# Patient Record
Sex: Male | Born: 1940 | Race: White | Hispanic: No | Marital: Married | Smoking: Current every day smoker
Health system: Southern US, Community
[De-identification: ages and names within clinical notes are randomized; demographics above are authoritative.]

## PROBLEM LIST (undated history)

## (undated) DIAGNOSIS — I82890 Acute embolism and thrombosis of other specified veins: Secondary | ICD-10-CM

## (undated) DIAGNOSIS — E119 Type 2 diabetes mellitus without complications: Secondary | ICD-10-CM

## (undated) DIAGNOSIS — I219 Acute myocardial infarction, unspecified: Secondary | ICD-10-CM

## (undated) DIAGNOSIS — J449 Chronic obstructive pulmonary disease, unspecified: Secondary | ICD-10-CM

## (undated) DIAGNOSIS — J45909 Unspecified asthma, uncomplicated: Secondary | ICD-10-CM

## (undated) DIAGNOSIS — C3490 Malignant neoplasm of unspecified part of unspecified bronchus or lung: Secondary | ICD-10-CM

## (undated) DIAGNOSIS — I10 Essential (primary) hypertension: Secondary | ICD-10-CM

## (undated) DIAGNOSIS — C9210 Chronic myeloid leukemia, BCR/ABL-positive, not having achieved remission: Secondary | ICD-10-CM

## (undated) DIAGNOSIS — C3410 Malignant neoplasm of upper lobe, unspecified bronchus or lung: Secondary | ICD-10-CM

## (undated) HISTORY — DX: Malignant neoplasm of unspecified part of unspecified bronchus or lung: C34.90

## (undated) HISTORY — DX: Chronic myeloid leukemia, BCR/ABL-positive, not having achieved remission: C92.10

## (undated) HISTORY — PX: THROAT SURGERY: SHX803

## (undated) HISTORY — DX: Acute embolism and thrombosis of other specified veins: I82.890

## (undated) HISTORY — DX: Type 2 diabetes mellitus without complications: E11.9

## (undated) HISTORY — PX: TUMOR REMOVAL: SHX12

## (undated) HISTORY — DX: Malignant neoplasm of upper lobe, unspecified bronchus or lung: C34.10

---

## 2005-03-12 ENCOUNTER — Ambulatory Visit: Payer: Self-pay

## 2006-11-17 ENCOUNTER — Emergency Department: Payer: Self-pay | Admitting: General Practice

## 2006-11-28 ENCOUNTER — Ambulatory Visit: Payer: Self-pay | Admitting: Internal Medicine

## 2007-10-09 DIAGNOSIS — C3410 Malignant neoplasm of upper lobe, unspecified bronchus or lung: Secondary | ICD-10-CM

## 2007-10-09 HISTORY — DX: Malignant neoplasm of upper lobe, unspecified bronchus or lung: C34.10

## 2008-03-04 ENCOUNTER — Ambulatory Visit: Payer: Self-pay | Admitting: Internal Medicine

## 2008-03-08 ENCOUNTER — Ambulatory Visit: Payer: Self-pay | Admitting: Internal Medicine

## 2008-03-10 ENCOUNTER — Ambulatory Visit: Payer: Self-pay | Admitting: Internal Medicine

## 2008-03-17 ENCOUNTER — Ambulatory Visit: Payer: Self-pay | Admitting: Internal Medicine

## 2008-03-23 ENCOUNTER — Ambulatory Visit: Payer: Self-pay | Admitting: Internal Medicine

## 2008-04-07 ENCOUNTER — Ambulatory Visit: Payer: Self-pay | Admitting: Internal Medicine

## 2008-05-08 ENCOUNTER — Ambulatory Visit: Payer: Self-pay | Admitting: Internal Medicine

## 2008-06-08 ENCOUNTER — Ambulatory Visit: Payer: Self-pay | Admitting: Internal Medicine

## 2008-07-08 ENCOUNTER — Ambulatory Visit: Payer: Self-pay | Admitting: Internal Medicine

## 2008-08-08 ENCOUNTER — Ambulatory Visit: Payer: Self-pay | Admitting: Internal Medicine

## 2008-09-07 ENCOUNTER — Ambulatory Visit: Payer: Self-pay | Admitting: Internal Medicine

## 2008-10-08 ENCOUNTER — Ambulatory Visit: Payer: Self-pay | Admitting: Internal Medicine

## 2008-10-13 ENCOUNTER — Ambulatory Visit: Payer: Self-pay | Admitting: Internal Medicine

## 2008-11-08 ENCOUNTER — Ambulatory Visit: Payer: Self-pay | Admitting: Internal Medicine

## 2009-02-05 ENCOUNTER — Ambulatory Visit: Payer: Self-pay | Admitting: Oncology

## 2009-02-14 ENCOUNTER — Ambulatory Visit: Payer: Self-pay | Admitting: Internal Medicine

## 2009-03-08 ENCOUNTER — Ambulatory Visit: Payer: Self-pay | Admitting: Oncology

## 2009-03-08 ENCOUNTER — Ambulatory Visit: Payer: Self-pay | Admitting: Internal Medicine

## 2009-06-08 ENCOUNTER — Ambulatory Visit: Payer: Self-pay | Admitting: Internal Medicine

## 2009-07-04 ENCOUNTER — Ambulatory Visit: Payer: Self-pay | Admitting: Internal Medicine

## 2009-07-08 ENCOUNTER — Ambulatory Visit: Payer: Self-pay | Admitting: Internal Medicine

## 2009-08-15 ENCOUNTER — Ambulatory Visit: Payer: Self-pay | Admitting: Gastroenterology

## 2009-10-08 ENCOUNTER — Ambulatory Visit: Payer: Self-pay | Admitting: Internal Medicine

## 2009-10-10 ENCOUNTER — Ambulatory Visit: Payer: Self-pay | Admitting: Internal Medicine

## 2009-10-12 ENCOUNTER — Ambulatory Visit: Payer: Self-pay | Admitting: Internal Medicine

## 2009-11-08 ENCOUNTER — Ambulatory Visit: Payer: Self-pay | Admitting: Internal Medicine

## 2010-02-05 ENCOUNTER — Ambulatory Visit: Payer: Self-pay | Admitting: Internal Medicine

## 2010-02-16 ENCOUNTER — Ambulatory Visit: Payer: Self-pay | Admitting: Internal Medicine

## 2010-03-08 ENCOUNTER — Ambulatory Visit: Payer: Self-pay | Admitting: Internal Medicine

## 2010-03-17 ENCOUNTER — Ambulatory Visit: Payer: Self-pay | Admitting: Internal Medicine

## 2010-03-30 ENCOUNTER — Ambulatory Visit: Payer: Self-pay | Admitting: Vascular Surgery

## 2010-04-07 ENCOUNTER — Ambulatory Visit: Payer: Self-pay | Admitting: Internal Medicine

## 2010-06-22 ENCOUNTER — Ambulatory Visit: Payer: Self-pay | Admitting: Internal Medicine

## 2011-02-06 ENCOUNTER — Ambulatory Visit: Payer: Self-pay | Admitting: Internal Medicine

## 2011-03-06 ENCOUNTER — Inpatient Hospital Stay: Payer: Self-pay | Admitting: Internal Medicine

## 2011-03-09 ENCOUNTER — Ambulatory Visit: Payer: Self-pay | Admitting: Internal Medicine

## 2011-03-14 ENCOUNTER — Ambulatory Visit: Payer: Self-pay | Admitting: Internal Medicine

## 2011-04-08 ENCOUNTER — Ambulatory Visit: Payer: Self-pay | Admitting: Internal Medicine

## 2011-05-31 ENCOUNTER — Ambulatory Visit: Payer: Self-pay | Admitting: Pain Medicine

## 2011-06-07 ENCOUNTER — Ambulatory Visit: Payer: Self-pay | Admitting: Pain Medicine

## 2011-06-14 ENCOUNTER — Ambulatory Visit: Payer: Self-pay | Admitting: Pain Medicine

## 2011-07-09 ENCOUNTER — Inpatient Hospital Stay: Payer: Self-pay | Admitting: Internal Medicine

## 2011-07-19 ENCOUNTER — Ambulatory Visit: Payer: Self-pay | Admitting: Otolaryngology

## 2011-07-31 ENCOUNTER — Ambulatory Visit: Payer: Self-pay | Admitting: Otolaryngology

## 2012-04-21 DIAGNOSIS — J3800 Paralysis of vocal cords and larynx, unspecified: Secondary | ICD-10-CM | POA: Insufficient documentation

## 2012-04-21 DIAGNOSIS — I252 Old myocardial infarction: Secondary | ICD-10-CM | POA: Insufficient documentation

## 2012-04-21 DIAGNOSIS — M1990 Unspecified osteoarthritis, unspecified site: Secondary | ICD-10-CM | POA: Insufficient documentation

## 2012-04-21 DIAGNOSIS — R0602 Shortness of breath: Secondary | ICD-10-CM | POA: Insufficient documentation

## 2012-04-21 DIAGNOSIS — J441 Chronic obstructive pulmonary disease with (acute) exacerbation: Secondary | ICD-10-CM | POA: Insufficient documentation

## 2012-04-21 DIAGNOSIS — I10 Essential (primary) hypertension: Secondary | ICD-10-CM | POA: Insufficient documentation

## 2012-04-21 DIAGNOSIS — Z859 Personal history of malignant neoplasm, unspecified: Secondary | ICD-10-CM | POA: Insufficient documentation

## 2012-06-27 ENCOUNTER — Ambulatory Visit: Payer: Self-pay | Admitting: Specialist

## 2013-01-20 ENCOUNTER — Ambulatory Visit: Payer: Self-pay | Admitting: Specialist

## 2013-02-13 ENCOUNTER — Ambulatory Visit: Payer: Self-pay | Admitting: Internal Medicine

## 2013-02-17 ENCOUNTER — Ambulatory Visit: Payer: Self-pay | Admitting: Specialist

## 2013-02-24 ENCOUNTER — Ambulatory Visit: Payer: Self-pay | Admitting: Internal Medicine

## 2013-03-08 ENCOUNTER — Ambulatory Visit: Payer: Self-pay | Admitting: Internal Medicine

## 2013-08-07 ENCOUNTER — Ambulatory Visit: Payer: Self-pay | Admitting: Gastroenterology

## 2013-08-11 LAB — PATHOLOGY REPORT

## 2013-08-28 ENCOUNTER — Ambulatory Visit: Payer: Self-pay | Admitting: Internal Medicine

## 2013-08-28 LAB — CBC CANCER CENTER
Eosinophil #: 0.2 x10 3/mm (ref 0.0–0.7)
Eosinophil %: 3 %
HCT: 46.6 % (ref 40.0–52.0)
HGB: 15.4 g/dL (ref 13.0–18.0)
Lymphocyte %: 31.1 %
MCH: 28.6 pg (ref 26.0–34.0)
Neutrophil #: 4.4 x10 3/mm (ref 1.4–6.5)
Platelet: 195 x10 3/mm (ref 150–440)
RBC: 5.37 10*6/uL (ref 4.40–5.90)
RDW: 13.2 % (ref 11.5–14.5)
WBC: 7.7 x10 3/mm (ref 3.8–10.6)

## 2013-08-28 LAB — HEPATIC FUNCTION PANEL A (ARMC)
Albumin: 3.6 g/dL (ref 3.4–5.0)
Alkaline Phosphatase: 70 U/L (ref 50–136)
SGPT (ALT): 25 U/L (ref 12–78)

## 2013-08-28 LAB — CREATININE, SERUM
Creatinine: 1.14 mg/dL (ref 0.60–1.30)
EGFR (Non-African Amer.): >60

## 2013-08-28 LAB — CALCIUM: Calcium, Total: 9 mg/dL (ref 8.5–10.1)

## 2013-09-07 ENCOUNTER — Ambulatory Visit: Payer: Self-pay | Admitting: Internal Medicine

## 2014-02-26 ENCOUNTER — Ambulatory Visit: Payer: Self-pay | Admitting: Internal Medicine

## 2014-02-26 LAB — CBC CANCER CENTER
Basophil #: 0.1 x10 3/mm (ref 0.0–0.1)
Basophil %: 1.1 %
Eosinophil #: 0.2 x10 3/mm (ref 0.0–0.7)
Eosinophil %: 2.7 %
HCT: 44.3 % (ref 40.0–52.0)
HGB: 15.1 g/dL (ref 13.0–18.0)
LYMPHS ABS: 2.3 x10 3/mm (ref 1.0–3.6)
Lymphocyte %: 32.5 %
MCH: 29.1 pg (ref 26.0–34.0)
MCHC: 34 g/dL (ref 32.0–36.0)
MCV: 86 fL (ref 80–100)
MONO ABS: 0.6 x10 3/mm (ref 0.2–1.0)
Monocyte %: 8.2 %
NEUTROS PCT: 55.5 %
Neutrophil #: 3.9 x10 3/mm (ref 1.4–6.5)
PLATELETS: 164 x10 3/mm (ref 150–440)
RBC: 5.18 10*6/uL (ref 4.40–5.90)
RDW: 13.3 % (ref 11.5–14.5)
WBC: 6.9 x10 3/mm (ref 3.8–10.6)

## 2014-02-26 LAB — HEPATIC FUNCTION PANEL A (ARMC)
AST: 17 U/L (ref 15–37)
Albumin: 3.4 g/dL (ref 3.4–5.0)
Alkaline Phosphatase: 54 U/L
BILIRUBIN TOTAL: 0.4 mg/dL (ref 0.2–1.0)
Bilirubin, Direct: 0.1 mg/dL (ref 0.00–0.20)
SGPT (ALT): 25 U/L (ref 12–78)
Total Protein: 7.3 g/dL (ref 6.4–8.2)

## 2014-02-26 LAB — CREATININE, SERUM: CREATININE: 0.99 mg/dL (ref 0.60–1.30)

## 2014-02-26 LAB — CALCIUM: CALCIUM: 8.2 mg/dL — AB (ref 8.5–10.1)

## 2014-03-08 ENCOUNTER — Ambulatory Visit: Payer: Self-pay | Admitting: Internal Medicine

## 2014-05-06 ENCOUNTER — Encounter: Payer: Self-pay | Admitting: Podiatrist

## 2014-05-06 ENCOUNTER — Ambulatory Visit (INDEPENDENT_AMBULATORY_CARE_PROVIDER_SITE_OTHER): Payer: Medicare Other | Admitting: Podiatrist

## 2014-05-06 ENCOUNTER — Ambulatory Visit (INDEPENDENT_AMBULATORY_CARE_PROVIDER_SITE_OTHER): Payer: Medicare Other

## 2014-05-06 VITALS — BP 122/60 | HR 74 | Resp 16 | Ht 69.0 in | Wt 207.0 lb

## 2014-05-06 DIAGNOSIS — M722 Plantar fascial fibromatosis: Secondary | ICD-10-CM

## 2014-05-06 MED ORDER — MELOXICAM 15 MG PO TABS
15.0000 mg | ORAL_TABLET | Freq: Every day | ORAL | Status: DC
Start: 2014-05-06 — End: 2015-12-28

## 2014-05-06 MED ORDER — PREDNISONE 10 MG PO KIT: PACK | ORAL | Status: DC

## 2014-05-06 NOTE — Progress Notes (Signed)
   Subjective:    Patient ID: Richard Jimenez, male    DOB: 1941/02/10, 73 y.o.   MRN: [id number]  HPI Comments: i have left heel pain. Ive had it for 8 - 9 months. Its remained the same sometimes worse. Am is really bad. Walking and standing hurts. i seen dr Vickki Muff twice this month and he gave me 2 shots of cortisone and gave me a brace to sleep in. i have massaged my foot, exercises, and that's it  Foot Pain Associated symptoms include coughing, fatigue and numbness.   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   Review of Systems  Constitutional: Positive for fatigue.       Sweating  HENT: Positive for hearing loss and trouble swallowing.   Eyes: Positive for pain.  Respiratory: Positive for cough, shortness of breath and wheezing.   Genitourinary: Positive for frequency.  Musculoskeletal:       Joint pain  Skin: Positive for color change.  Neurological: Positive for numbness.  All other systems reviewed and are negative.      Objective:   Physical Exam  Patient is awake, alert, and oriented x 3.  In no acute distress.  Vascular status is intact with palpable pedal pulses at 2/4 DP and PT bilateral and capillary refill time within normal limits. Neurological sensation is also intact bilaterally via Semmes Weinstein monofilament at 5/5 sites. Light touch, vibratory sensation, Achilles tendon reflex is intact. Dermatological exam reveals skin color, turger and texture as normal. No open lesions present.  Musculature intact with dorsiflexion, plantarflexion, inversion, eversion.  Severe pain on palpation plantar medial and plantar central aspect of the left heel is noted. No trauma or injury reported. Mild pain with medial to lateral squeeze. Symptoms consistent with plantar fasciitis left resistant to conservative care.     Assessment & Plan:  Plantar fasciitis left foot  Plan: The patient has recently had 2  separate injections of steroid from Dr. Vickki Muff. He also has a brace and he states that nothing has helped his pain. I recommended a Sterapred Dosepak followed by [facility] anti-inflammatory and a plantar fascial bracing. I also offered him an offloading boot however would like to hold off on this at this visit. He will be seen back in 3 weeks for followup if no improvement an MRI may be warranted.

## 2014-05-06 NOTE — Patient Instructions (Signed)

## 2014-06-03 ENCOUNTER — Ambulatory Visit: Payer: Medicare Other | Admitting: Podiatry

## 2015-03-10 ENCOUNTER — Other Ambulatory Visit: Payer: Self-pay

## 2015-03-10 DIAGNOSIS — C3412 Malignant neoplasm of upper lobe, left bronchus or lung: Secondary | ICD-10-CM

## 2015-03-11 ENCOUNTER — Encounter (INDEPENDENT_AMBULATORY_CARE_PROVIDER_SITE_OTHER): Payer: Self-pay

## 2015-03-11 ENCOUNTER — Inpatient Hospital Stay: Payer: Medicare Other | Attending: Internal Medicine

## 2015-03-11 ENCOUNTER — Inpatient Hospital Stay (HOSPITAL_BASED_OUTPATIENT_CLINIC_OR_DEPARTMENT_OTHER): Payer: Medicare Other | Admitting: Internal Medicine

## 2015-03-11 VITALS — BP 148/80 | HR 66 | Temp 95.3°F | Resp 18 | Ht 69.0 in | Wt 209.9 lb

## 2015-03-11 DIAGNOSIS — R05 Cough: Secondary | ICD-10-CM

## 2015-03-11 DIAGNOSIS — C3412 Malignant neoplasm of upper lobe, left bronchus or lung: Secondary | ICD-10-CM

## 2015-03-11 DIAGNOSIS — Z85118 Personal history of other malignant neoplasm of bronchus and lung: Secondary | ICD-10-CM | POA: Insufficient documentation

## 2015-03-11 DIAGNOSIS — R0609 Other forms of dyspnea: Secondary | ICD-10-CM | POA: Insufficient documentation

## 2015-03-11 DIAGNOSIS — Z7982 Long term (current) use of aspirin: Secondary | ICD-10-CM

## 2015-03-11 DIAGNOSIS — Z9221 Personal history of antineoplastic chemotherapy: Secondary | ICD-10-CM | POA: Diagnosis not present

## 2015-03-11 DIAGNOSIS — Z800 Family history of malignant neoplasm of digestive organs: Secondary | ICD-10-CM

## 2015-03-11 DIAGNOSIS — Z923 Personal history of irradiation: Secondary | ICD-10-CM | POA: Insufficient documentation

## 2015-03-11 DIAGNOSIS — J449 Chronic obstructive pulmonary disease, unspecified: Secondary | ICD-10-CM | POA: Diagnosis not present

## 2015-03-11 DIAGNOSIS — F17210 Nicotine dependence, cigarettes, uncomplicated: Secondary | ICD-10-CM | POA: Diagnosis not present

## 2015-03-11 DIAGNOSIS — Z79899 Other long term (current) drug therapy: Secondary | ICD-10-CM | POA: Diagnosis not present

## 2015-03-11 LAB — HEPATIC FUNCTION PANEL
ALT: 20 U/L (ref 17–63)
AST: 22 U/L (ref 15–41)
Albumin: 3.7 g/dL (ref 3.5–5.0)
Alkaline Phosphatase: 45 U/L (ref 38–126)
BILIRUBIN TOTAL: 0.5 mg/dL (ref 0.3–1.2)
Bilirubin, Direct: 0.1 mg/dL (ref 0.1–0.5)
Indirect Bilirubin: 0.4 mg/dL (ref 0.3–0.9)
Total Protein: 6.9 g/dL (ref 6.5–8.1)

## 2015-03-11 LAB — CBC WITH DIFFERENTIAL/PLATELET
Basophils Absolute: 0 10*3/uL (ref 0–0.1)
Basophils Relative: 1 %
Eosinophils Absolute: 0.2 10*3/uL (ref 0–0.7)
Eosinophils Relative: 2 %
HCT: 41.1 % (ref 40.0–52.0)
Hemoglobin: 13.7 g/dL (ref 13.0–18.0)
LYMPHS PCT: 25 %
Lymphs Abs: 1.6 10*3/uL (ref 1.0–3.6)
MCH: 28.4 pg (ref 26.0–34.0)
MCHC: 33.4 g/dL (ref 32.0–36.0)
MCV: 85 fL (ref 80.0–100.0)
MONOS PCT: 7 %
Monocytes Absolute: 0.4 10*3/uL (ref 0.2–1.0)
Neutro Abs: 4.1 10*3/uL (ref 1.4–6.5)
Neutrophils Relative %: 65 %
PLATELETS: 183 10*3/uL (ref 150–440)
RBC: 4.84 MIL/uL (ref 4.40–5.90)
RDW: 13.3 % (ref 11.5–14.5)
WBC: 6.3 10*3/uL (ref 3.8–10.6)

## 2015-03-11 LAB — CALCIUM: Calcium: 8.4 mg/dL — ABNORMAL LOW (ref 8.9–10.3)

## 2015-03-11 LAB — CREATININE, SERUM: Creatinine, Ser: 1.03 mg/dL (ref 0.61–1.24)

## 2015-03-29 ENCOUNTER — Encounter: Payer: Self-pay | Admitting: Internal Medicine

## 2015-03-29 DIAGNOSIS — C3410 Malignant neoplasm of upper lobe, unspecified bronchus or lung: Secondary | ICD-10-CM | POA: Insufficient documentation

## 2015-03-29 NOTE — Progress Notes (Signed)
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  Completed 4 cycles chemotherapy and concurrent radiation September 2009  CT scan of the chest May 2012 - 9x4 mm RUL lung nodule PET scan on 03/09/11 negative for any lung abnormality.     HISTORY OF PRESENT ILLNESS:  Patient returns for continued oncology followup, he was seen about one year ago. Last CT scan of the chest on April 15 which continued to show left upper lung abnormality as before, he then had PET scan done on Feb 17, 2013 which was reported unremarkable for recurrent malignancy.  States that his chronic cough and dyspnea on exertion from COPD is unchanged.  Denies hemoptysis or chest pain.  Eating steady, no unintentional weight loss.  No new headaches or imbalance.  Remains physically active as much as possible.. No pain issues, 0/10.   REVIEW OF SYSTEMS:   ROS As in HPI above. In addition, no fever, chills or sweats. No new headaches or focal weakness.  No new sore throat or dysphagia. No hemoptysis or chest pain. No dizziness or palpitation. No abdominal pain, constipation, diarrhea, dysuria or hematuria. No new skin rash or bleeding symptoms. No new paresthesias in extremities. PS ECOG 1.  PAST MEDICAL HISTORY: Reviewed Past Medical History  Diagnosis Date  . 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 T2/T3, N0, M0 (stage IIB) squamous cell carcinoma of left upper lobe   lung - refused surgery. Completed 4 cycles chemotherapy and concurrent radiation September 2009  Colonoscopy in November 2014 - tubular adenomas x2  PAST SURGICAL  HISTORY:Reviewed As above  FAMILY HISTORY:Reviewed Noncontributory. Remarkable for colon cancer.   ADVANCED DIRECTIVES:  No - patient declined information  SOCIAL HISTORY: Reviewed. Chronic smoker, 50 pack year history, ongoing. Occasional alcohol intake. No history of recreational drug usage.  Allergies  Allergen Reactions  . Clarithromycin Nausea And Vomiting  . Penicillins Nausea Only    Current Outpatient Prescriptions  Medication Sig Dispense Refill  . ALBUTEROL IN Inhale into the lungs as needed.    . Fluticasone-Salmeterol (ADVAIR DISKUS) 250-50 MCG/DOSE AEPB Inhale into the lungs.    . meloxicam (MOBIC) 15 MG tablet Take 1 tablet (15 mg total) by mouth daily. (Patient not taking: Reported on 03/11/2015) 30 tablet 2  . PredniSONE 10 MG KIT 6 day tapering dose (Patient not taking: Reported on 03/11/2015) 1 kit 0   No current facility-administered medications for this visit.    OBJECTIVE: Filed Vitals:   03/11/15 1124  BP: 148/80  Pulse: 66  Temp: 95.3 F (35.2 C)  Resp: 18     Body mass index is 30.98 kg/(m^2).    ECOG FS:1 - Symptomatic but completely ambulatory  GENERAL: Patient is alert and oriented and in no acute distress. There is no icterus. HEENT: EOMs intact. Oral exam negative for thrush or lesions. No cervical lymphadenopathy. CVS: S1S2, regular LUNGS: Bilaterally clear to auscultation, no rhonchi. ABDOMEN: Soft, nontender. No hepatosplenomegaly clinically.  EXTREMITIES: No pedal edema.   LAB RESULTS:    Component Value Date/Time   CREATININE 1.03 03/11/2015 1020   CREATININE  0.99 02/26/2014 1033   CALCIUM 8.4* 03/11/2015 1020   CALCIUM 8.2* 02/26/2014 1033   PROT 6.9 03/11/2015 1020   PROT 7.3 02/26/2014 1033   ALBUMIN 3.7 03/11/2015 1020   ALBUMIN 3.4 02/26/2014 1033   AST 22 03/11/2015 1020   AST 17 02/26/2014 1033   ALT 20 03/11/2015 1020   ALT 25 02/26/2014 1033   ALKPHOS 45 03/11/2015 1020   ALKPHOS 54 02/26/2014 1033   BILITOT 0.5  03/11/2015 1020   GFRNONAA >60 03/11/2015 1020   GFRNONAA >60 02/26/2014 1033   GFRAA >60 03/11/2015 1020   GFRAA >60 02/26/2014 1033    No results found for: SPEP, UPEP  Lab Results  Component Value Date   WBC 6.3 03/11/2015   NEUTROABS 4.1 03/11/2015   HGB 13.7 03/11/2015   HCT 41.1 03/11/2015   MCV 85.0 03/11/2015   PLT 183 03/11/2015     STUDIES: April 2014 - CT scan of the chest. IMPRESSION:  Persistent soft tissue nodular density in the left upper lobe extending to the left lung apex with bony erosion of the first rib. There is some bony sclerosis present in this area as well. PET CT would  be the most accurate continued followup evaluate for persistent or recurrent malignant disease. There is stable nodularity in the right upper lobe.  May 2014 - PET scan. IMPRESSION:  1. I do not see abnormal uptake of the radiopharmaceutical within the neck or chest or abdomen or pelvis to suggest recurrent malignancy.   2. Low level increased uptake associated with both vocal cords are most compatible with phonation.   3. There is stable parenchymal consolidation in the superior aspect of the left upper lobe. There is no mediastinal or hilar lymphadenopathy or abnormal uptake.   4. No abnormal uptake within the chest wall is demonstrated. No lytic or blastic chest wall lesion it is demonstrated.   STAGING: Lung cancer, upper lobe   Staging form: Lung, AJCC 7th Edition     Clinical stage from 03/29/2015: Stage IIB (T3, N0, M0) - Signed by Leia Alf, MD on 03/29/2015.   ASSESSMENT / PLAN:   1. History of Stage IIB squamous lung cancer s/p 4 cycles chemotherapy and concurrent radiation completed September 2009 -  Reviewed labs and d/w patient. He does not have any clinical evidence to suggest recurrent or metastatic lung cancer. He has symptoms of chronic COPD and follows with Dr. Raul Del.  Otherwise no hemoptysis, chest pain, unintentional weight loss or other symptoms to suggest  recurrent or metastatic lung cancer.  CT scan of the chest done in April 2014 showed persistent abnormalities, he then had PET scan done in May 2014 which did not show any progressive or new abnormality to suggest recurrent/progressive lung cancer.  Patient is now > 6 years out of treatment for lung cancer. Plan is to continue surveillance, will see him back in 12 months with labs including CBC, creatinine, calcium, LFT. Will consider radiological evaluation if he develops new symptoms or signs to suggest recurrent malignancy. 2. Smoking Cessation - have again strongly advised to completely quit smoking, he does not want to take any patches or try medication. States that he look into other options and try to quit on his own. 3. In between visits, the patient has been advised to call or come to the ER in case of new symptoms, unintentional weight loss or acute sickness and will be evaluated sooner. He is agreeable to this plan.   Leia Alf, MD  03/29/2015 8:20 PM

## 2015-07-08 DIAGNOSIS — E119 Type 2 diabetes mellitus without complications: Secondary | ICD-10-CM | POA: Insufficient documentation

## 2015-12-28 ENCOUNTER — Encounter: Payer: Self-pay | Admitting: Medical Oncology

## 2015-12-28 ENCOUNTER — Emergency Department
Admission: EM | Admit: 2015-12-28 | Discharge: 2015-12-28 | Disposition: A | Discharge status: Home / Self Care | Payer: Medicare Other | Attending: Emergency Medicine | Admitting: Emergency Medicine

## 2015-12-28 ENCOUNTER — Emergency Department: Payer: Medicare Other

## 2015-12-28 DIAGNOSIS — F17200 Nicotine dependence, unspecified, uncomplicated: Secondary | ICD-10-CM | POA: Diagnosis not present

## 2015-12-28 DIAGNOSIS — R0789 Other chest pain: Secondary | ICD-10-CM | POA: Insufficient documentation

## 2015-12-28 DIAGNOSIS — E119 Type 2 diabetes mellitus without complications: Secondary | ICD-10-CM | POA: Insufficient documentation

## 2015-12-28 DIAGNOSIS — Z880 Allergy status to penicillin: Secondary | ICD-10-CM | POA: Diagnosis not present

## 2015-12-28 DIAGNOSIS — R079 Chest pain, unspecified: Secondary | ICD-10-CM | POA: Diagnosis present

## 2015-12-28 DIAGNOSIS — Z7982 Long term (current) use of aspirin: Secondary | ICD-10-CM | POA: Insufficient documentation

## 2015-12-28 DIAGNOSIS — Z79899 Other long term (current) drug therapy: Secondary | ICD-10-CM | POA: Insufficient documentation

## 2015-12-28 DIAGNOSIS — Z7984 Long term (current) use of oral hypoglycemic drugs: Secondary | ICD-10-CM | POA: Diagnosis not present

## 2015-12-28 DIAGNOSIS — I10 Essential (primary) hypertension: Secondary | ICD-10-CM | POA: Insufficient documentation

## 2015-12-28 HISTORY — DX: Chronic obstructive pulmonary disease, unspecified: J44.9

## 2015-12-28 HISTORY — DX: Essential (primary) hypertension: I10

## 2015-12-28 HISTORY — DX: Type 2 diabetes mellitus without complications: E11.9

## 2015-12-28 HISTORY — DX: Acute myocardial infarction, unspecified: I21.9

## 2015-12-28 LAB — BASIC METABOLIC PANEL
ANION GAP: 6 (ref 5–15)
BUN: 13 mg/dL (ref 6–20)
CHLORIDE: 103 mmol/L (ref 101–111)
CO2: 28 mmol/L (ref 22–32)
Calcium: 9.2 mg/dL (ref 8.9–10.3)
Creatinine, Ser: 0.88 mg/dL (ref 0.61–1.24)
GFR calc non Af Amer: >60 mL/min (ref >60)
Glucose, Bld: 141 mg/dL — ABNORMAL HIGH (ref 65–99)
POTASSIUM: 4 mmol/L (ref 3.5–5.1)
Sodium: 137 mmol/L (ref 135–145)

## 2015-12-28 LAB — CBC
HEMATOCRIT: 44.4 % (ref 40.0–52.0)
HEMOGLOBIN: 14.7 g/dL (ref 13.0–18.0)
MCH: 28 pg (ref 26.0–34.0)
MCHC: 33.1 g/dL (ref 32.0–36.0)
MCV: 84.4 fL (ref 80.0–100.0)
Platelets: 198 10*3/uL (ref 150–440)
RBC: 5.26 MIL/uL (ref 4.40–5.90)
RDW: 13.8 % (ref 11.5–14.5)
WBC: 7.5 10*3/uL (ref 3.8–10.6)

## 2015-12-28 LAB — TROPONIN I
Troponin I: <0.03 ng/mL (ref <0.031)
Troponin I: <0.03 ng/mL (ref <0.031)

## 2015-12-28 MED ORDER — ASPIRIN EC 325 MG PO TBEC: 325.0000 mg | DELAYED_RELEASE_TABLET | Freq: Every day | ORAL | Status: DC

## 2015-12-28 MED ORDER — FAMOTIDINE 20 MG PO TABS
40.0000 mg | ORAL_TABLET | Freq: Once | ORAL | Status: AC
  Administered 2015-12-28: 40 mg via ORAL
  Filled 2015-12-28: qty 2

## 2015-12-28 MED ORDER — SUCRALFATE 1 G PO TABS: 1.0000 g | ORAL_TABLET | Freq: Four times a day (QID) | ORAL | Status: DC

## 2015-12-28 MED ORDER — GI COCKTAIL ~~LOC~~
30.0000 mL | ORAL | Status: AC
  Administered 2015-12-28: 30 mL via ORAL
  Filled 2015-12-28: qty 30

## 2015-12-28 MED ORDER — ASPIRIN 81 MG PO CHEW
324.0000 mg | CHEWABLE_TABLET | Freq: Once | ORAL | Status: AC
  Administered 2015-12-28: 324 mg via ORAL
  Filled 2015-12-28: qty 4

## 2015-12-28 MED ORDER — RANITIDINE HCL 150 MG PO CAPS: 150.0000 mg | ORAL_CAPSULE | Freq: Two times a day (BID) | ORAL | Status: DC

## 2015-12-28 NOTE — ED Provider Notes (Signed)
[facility] Emergency Department Provider Note  ____________________________________________  Time seen: 11:40 AM  I have reviewed the triage vital signs and the nursing notes.   HISTORY  Chief Complaint Chest Pain    HPI Richard Jimenez is a 75 y.o. male who complains of chest pain for the past 3 days. It is intermittent lasting less than 1 minute at a time, described as aching, nonradiating, no shortness of breath diaphoresis or vomiting. Not exertional, not pleuritic. No aggravating or alleviating factors. Moderate to severe in intensity when it comes on. 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 This does not feel like any other pains that he has had before related to his COPD or lung cancer. Reports that his only medication is metformin for diabetes. He has borderline hypertension and borderline hyperlipidemia according to the patient. Does not have a cardiologist. Sees Dr. Raul Del for pulmonology and Dr. Gilford Rile for primary care.     Past Medical History  Diagnosis Date  . Lung cancer, upper lobe ([place]) 03/29/2015  . Myocardial infarct ([place])   . COPD (chronic obstructive pulmonary disease) ([place])   . Hypertension   . Diabetes mellitus without complication [facility])      Patient Active Problem List   Diagnosis Date Noted  . Lung cancer, upper lobe ([place]) 03/29/2015     Past Surgical History  Procedure Laterality Date  . Throat surgery       Current Outpatient Rx  Name  Route  Sig  Dispense  Refill  . albuterol (PROAIR HFA) 108 (90 Base) MCG/ACT inhaler   Inhalation   Inhale 2 puffs into the lungs every 6 (six) hours as needed.         Marland Kitchen albuterol (PROVENTIL) (2.5 MG/3ML) 0.083% nebulizer solution   Inhalation   Inhale 3 mLs into the lungs every 12 (twelve) hours as needed.         . metFORMIN (GLUCOPHAGE) 1000 MG tablet   Oral   Take 1,000 mg by mouth 2 (two) times  daily.         Marland Kitchen aspirin EC 325 MG tablet   Oral   Take 1 tablet (325 mg total) by mouth daily.   30 tablet   0   . ranitidine (ZANTAC) 150 MG capsule   Oral   Take 1 capsule (150 mg total) by mouth 2 (two) times daily.   28 capsule   0   . sucralfate (CARAFATE) 1 g tablet   Oral   Take 1 tablet (1 g total) by mouth 4 (four) times daily.   120 tablet   1      Allergies Clarithromycin and Penicillins   No family history on file.  Social History Social History  Substance Use Topics  . Smoking status: Current Every Day Smoker  . Smokeless tobacco: None  . Alcohol Use: Yes     Comment: rarely    Review of Systems  Constitutional:   No fever or chills. No weight changes Eyes:   No blurry vision or double vision.  ENT:   No sore throat.  Cardiovascular:   Positive as above chest pain. Respiratory:   No dyspnea or cough. Gastrointestinal:   Negative for abdominal pain, vomiting and diarrhea.  No BRBPR or melena. Genitourinary:   Negative for dysuria or difficulty urinating. Musculoskeletal:   Negative for back pain. No joint swelling or pain. Skin:  Negative for rash. Neurological:   Negative for headaches, focal weakness or numbness. Psychiatric:  No anxiety or depression.   Endocrine:  No changes in energy or sleep difficulty.  10-point ROS otherwise negative.  ____________________________________________   PHYSICAL EXAM:  VITAL SIGNS: ED Triage Vitals  Enc Vitals Group     BP 12/28/15 1120 149/74 mmHg     Pulse Rate 12/28/15 1120 80     Resp 12/28/15 1120 20     Temp --      Temp src --      SpO2 12/28/15 1120 99 %     Weight 12/28/15 1120 209 lb (94.802 kg)     Height 12/28/15 1120 '5\' 9"'$  (1.753 m)     Head Cir --      Peak Flow --      Pain Score 12/28/15 1120 4     Pain Loc --      Pain Edu? --      Excl. in [place]? --     Vital signs reviewed, nursing assessments reviewed.   Constitutional:   Alert and oriented. Well appearing and in  no distress. Eyes:   No scleral icterus. No conjunctival pallor. PERRL. EOMI ENT   Head:   Normocephalic and atraumatic.   Nose:   No congestion/rhinnorhea. No septal hematoma   Mouth/Throat:   MMM, no pharyngeal erythema. No peritonsillar mass.    Neck:   No stridor. No SubQ emphysema. No meningismus. Hematological/Lymphatic/Immunilogical:   No cervical lymphadenopathy. Cardiovascular:   RRR. Symmetric bilateral radial and DP pulses.  No murmurs.  Respiratory:   Normal respiratory effort without tachypnea nor retractions. Breath sounds are clear and equal bilaterally. No wheezes/rales/rhonchi. Gastrointestinal:   Soft and nontender. Non distended. There is no CVA tenderness.  No rebound, rigidity, or guarding. Genitourinary:   deferred Musculoskeletal:   Nontender with normal range of motion in all extremities. No joint effusions.  No lower extremity tenderness.  No edema. Chest wall nontender Neurologic:   Normal speech and language.  CN 2-10 normal. Motor grossly intact. No gross focal neurologic deficits are appreciated.  Skin:    Skin is warm, dry and intact. No rash noted.  No petechiae, purpura, or bullae. Psychiatric:   Mood and affect are normal. ____________________________________________    LABS (pertinent positives/negatives) (all labs ordered are listed, but only abnormal results are displayed) Labs Reviewed  BASIC METABOLIC PANEL - Abnormal; Notable for the following:    Glucose, Bld 141 (*)    All other components within normal limits  CBC  TROPONIN I  TROPONIN I   ____________________________________________   EKG  Interpreted by me Normal sinus rhythm rate of 80, normal axis, normal intervals. Left bundle branch block. Normal ST segments. T wave inversions in the lateral leads due to repolarization abnormality with a bundle-branch block, no acute ischemic changes.  ____________________________________________    RADIOLOGY  Chest x-ray  unremarkable  ____________________________________________   PROCEDURES   ____________________________________________   INITIAL IMPRESSION / ASSESSMENT AND PLAN / ED COURSE  Pertinent labs & imaging results that were available during my care of the patient were reviewed by me and considered in my medical decision making (see chart for details).  Patient presented with atypical chest pain.Considering the patient's symptoms, medical history, and physical examination today, I have low suspicion for ACS, PE, TAD, pneumothorax, carditis, mediastinitis, pneumonia, CHF, or sepsis.  Given the vagueness of his current symptoms, we'll give aspirin as well as a GI cocktail as a trial.  I think that his pain is very unlikely to be related to ACS, but due to his risk factors are to check troponin 2. This was all negative. His symptoms resolved after receiving a GI cocktail in the ED. On reassessment at 3:00 PM the patient is completely symptomatic and feels much better and wants to go home. I see no benefit to hospitalization at this time. I encouraged him to follow up closely with cardiology for further evaluation. Continue antiacids as well as aspirin. Given his response to therapy and the nature of the symptoms I think this is highly likely to be GERD.     ____________________________________________   FINAL CLINICAL IMPRESSION(S) / ED DIAGNOSES  Final diagnoses:  Atypical chest pain      Carrie Mew, MD 12/28/15 [phone]

## 2015-12-28 NOTE — Discharge Instructions (Signed)
Nonspecific Chest Pain  °Chest pain can be caused by many different conditions. There is always a chance that your pain could be related to something serious, such as a heart attack or a blood clot in your lungs. Chest pain can also be caused by conditions that are not life-threatening. If you have chest pain, it is very important to follow up with your health care provider. °CAUSES  °Chest pain can be caused by: °· Heartburn. °· Pneumonia or bronchitis. °· Anxiety or stress. °· Inflammation around your heart (pericarditis) or lung (pleuritis or pleurisy). °· A blood clot in your lung. °· A collapsed lung (pneumothorax). It can develop suddenly on its own (spontaneous pneumothorax) or from trauma to the chest. °· Shingles infection (varicella-zoster virus). °· Heart attack. °· Damage to the bones, muscles, and cartilage that make up your chest wall. This can include: °¨ Bruised bones due to injury. °¨ Strained muscles or cartilage due to frequent or repeated coughing or overwork. °¨ Fracture to one or more ribs. °¨ Sore cartilage due to inflammation (costochondritis). °RISK FACTORS  °Risk factors for chest pain may include: °· Activities that increase your risk for trauma or injury to your chest. °· Respiratory infections or conditions that cause frequent coughing. °· Medical conditions or overeating that can cause heartburn. °· Heart disease or family history of heart disease. °· Conditions or health behaviors that increase your risk of developing a blood clot. °· Having had chicken pox (varicella zoster). °SIGNS AND SYMPTOMS °Chest pain can feel like: °· Burning or tingling on the surface of your chest or deep in your chest. °· Crushing, pressure, aching, or squeezing pain. °· Dull or sharp pain that is worse when you move, cough, or take a deep breath. °· Pain that is also felt in your back, neck, shoulder, or arm, or pain that spreads to any of these areas. °Your chest pain may come and go, or it may stay  constant. °DIAGNOSIS °Lab tests or other studies may be needed to find the cause of your pain. Your health care provider may have you take a test called an ambulatory ECG (electrocardiogram). An ECG records your heartbeat patterns at the time the test is performed. You may also have other tests, such as: °· Transthoracic echocardiogram (TTE). During echocardiography, sound waves are used to create a picture of all of the heart structures and to look at how blood flows through your heart. °· Transesophageal echocardiogram (TEE). This is a more advanced imaging test that obtains images from inside your body. It allows your health care provider to see your heart in finer detail. °· Cardiac monitoring. This allows your health care provider to monitor your heart rate and rhythm in real time. °· Holter monitor. This is a portable device that records your heartbeat and can help to diagnose abnormal heartbeats. It allows your health care provider to track your heart activity for several days, if needed. °· Stress tests. These can be done through exercise or by taking medicine that makes your heart beat more quickly. °· Blood tests. °· Imaging tests. °TREATMENT  °Your treatment depends on what is causing your chest pain. Treatment may include: °· Medicines. These may include: °¨ Acid blockers for heartburn. °¨ Anti-inflammatory medicine. °¨ Pain medicine for inflammatory conditions. °¨ Antibiotic medicine, if an infection is present. °¨ Medicines to dissolve blood clots. °¨ Medicines to treat coronary artery disease. °· Supportive care for conditions that do not require medicines. This may include: °¨ Resting. °¨ Applying heat   or cold packs to injured areas. °¨ Limiting activities until pain decreases. °HOME CARE INSTRUCTIONS °· If you were prescribed an antibiotic medicine, finish it all even if you start to feel better. °· Avoid any activities that bring on chest pain. °· Do not use any tobacco products, including  cigarettes, chewing tobacco, or electronic cigarettes. If you need help quitting, ask your health care provider. °· Do not drink alcohol. °· Take medicines only as directed by your health care provider. °· Keep all follow-up visits as directed by your health care provider. This is important. This includes any further testing if your chest pain does not go away. °· If heartburn is the cause for your chest pain, you may be told to keep your head raised (elevated) while sleeping. This reduces the chance that acid will go from your stomach into your esophagus. °· Make lifestyle changes as directed by your health care provider. These may include: °¨ Getting regular exercise. Ask your health care provider to suggest some activities that are safe for you. °¨ Eating a heart-healthy diet. A registered dietitian can help you to learn healthy eating options. °¨ Maintaining a healthy weight. °¨ Managing diabetes, if necessary. °¨ Reducing stress. °SEEK MEDICAL CARE IF: °· Your chest pain does not go away after treatment. °· You have a rash with blisters on your chest. °· You have a fever. °SEEK IMMEDIATE MEDICAL CARE IF:  °· Your chest pain is worse. °· You have an increasing cough, or you cough up blood. °· You have severe abdominal pain. °· You have severe weakness. °· You faint. °· You have chills. °· You have sudden, unexplained chest discomfort. °· You have sudden, unexplained discomfort in your arms, back, neck, or jaw. °· You have shortness of breath at any time. °· You suddenly start to sweat, or your skin gets clammy. °· You feel nauseous or you vomit. °· You suddenly feel light-headed or dizzy. °· Your heart begins to beat quickly, or it feels like it is skipping beats. °These symptoms may represent a serious problem that is an emergency. Do not wait to see if the symptoms will go away. Get medical help right away. Call your local emergency services (911 in the [place] Do not drive yourself to the hospital. °  °This  information is not intended to replace advice given to you by your health care provider. Make sure you discuss any questions you have with your health care provider. °  °Document Released: 07/04/2005 Document Revised: 10/15/2014 Document Reviewed: 04/30/2014 °Elsevier Interactive Patient Education ©2016 [employer] ° °

## 2015-12-28 NOTE — ED Notes (Signed)
Pt reports left sided chest pain x 3 days with reports that pain feels similar to time he had MI in past. Pt reports sob with pain.

## 2015-12-29 ENCOUNTER — Telehealth: Payer: Self-pay | Admitting: Cardiology

## 2015-12-29 NOTE — Telephone Encounter (Signed)
Spoke with patient, Pt was seen in ED for Chest Pain on 12/28/15 Pt is coming 01/12/16 to see Dr Yvone Neu

## 2015-12-29 NOTE — Telephone Encounter (Signed)
Spoke with patient regarding his recent visit to the ED for chest pain. He stated that he was feeling better and would be in for his appointment. Verified his appointment date and time 01/12/16 at 1:30PM. He had no further questions at this time.

## 2016-01-03 ENCOUNTER — Other Ambulatory Visit: Payer: Self-pay | Admitting: Internal Medicine

## 2016-01-03 DIAGNOSIS — R071 Chest pain on breathing: Secondary | ICD-10-CM

## 2016-01-09 ENCOUNTER — Ambulatory Visit
Admission: RE | Admit: 2016-01-09 | Discharge: 2016-01-09 | Disposition: A | Discharge status: Home / Self Care | Payer: Medicare Other | Source: Ambulatory Visit | Attending: Internal Medicine | Admitting: Internal Medicine

## 2016-01-09 DIAGNOSIS — I700 Atherosclerosis of aorta: Secondary | ICD-10-CM | POA: Insufficient documentation

## 2016-01-09 DIAGNOSIS — R071 Chest pain on breathing: Secondary | ICD-10-CM | POA: Diagnosis not present

## 2016-01-09 DIAGNOSIS — C770 Secondary and unspecified malignant neoplasm of lymph nodes of head, face and neck: Secondary | ICD-10-CM | POA: Diagnosis not present

## 2016-01-09 DIAGNOSIS — I2510 Atherosclerotic heart disease of native coronary artery without angina pectoris: Secondary | ICD-10-CM | POA: Insufficient documentation

## 2016-01-09 DIAGNOSIS — C7800 Secondary malignant neoplasm of unspecified lung: Secondary | ICD-10-CM | POA: Diagnosis not present

## 2016-01-09 DIAGNOSIS — C771 Secondary and unspecified malignant neoplasm of intrathoracic lymph nodes: Secondary | ICD-10-CM | POA: Insufficient documentation

## 2016-01-09 DIAGNOSIS — R911 Solitary pulmonary nodule: Secondary | ICD-10-CM | POA: Insufficient documentation

## 2016-01-09 HISTORY — DX: Unspecified asthma, uncomplicated: J45.909

## 2016-01-09 MED ORDER — IOPAMIDOL (ISOVUE-300) INJECTION 61%
75.0000 mL | Freq: Once | INTRAVENOUS | Status: AC | PRN
  Administered 2016-01-09: 75 mL via INTRAVENOUS

## 2016-01-12 ENCOUNTER — Ambulatory Visit: Payer: Medicare Other | Admitting: Cardiology

## 2016-01-13 ENCOUNTER — Inpatient Hospital Stay (HOSPITAL_BASED_OUTPATIENT_CLINIC_OR_DEPARTMENT_OTHER): Payer: Medicare Other | Admitting: Internal Medicine

## 2016-01-13 ENCOUNTER — Encounter: Payer: Self-pay | Admitting: Internal Medicine

## 2016-01-13 ENCOUNTER — Inpatient Hospital Stay: Payer: Medicare Other | Attending: Internal Medicine

## 2016-01-13 VITALS — BP 135/67 | HR 73 | Temp 98.1°F | Ht 69.0 in | Wt 201.7 lb

## 2016-01-13 DIAGNOSIS — E119 Type 2 diabetes mellitus without complications: Secondary | ICD-10-CM | POA: Insufficient documentation

## 2016-01-13 DIAGNOSIS — Z7984 Long term (current) use of oral hypoglycemic drugs: Secondary | ICD-10-CM

## 2016-01-13 DIAGNOSIS — C3492 Malignant neoplasm of unspecified part of left bronchus or lung: Secondary | ICD-10-CM | POA: Insufficient documentation

## 2016-01-13 DIAGNOSIS — Z800 Family history of malignant neoplasm of digestive organs: Secondary | ICD-10-CM | POA: Diagnosis not present

## 2016-01-13 DIAGNOSIS — F17210 Nicotine dependence, cigarettes, uncomplicated: Secondary | ICD-10-CM

## 2016-01-13 DIAGNOSIS — I252 Old myocardial infarction: Secondary | ICD-10-CM | POA: Diagnosis not present

## 2016-01-13 DIAGNOSIS — J45909 Unspecified asthma, uncomplicated: Secondary | ICD-10-CM

## 2016-01-13 DIAGNOSIS — Z9221 Personal history of antineoplastic chemotherapy: Secondary | ICD-10-CM | POA: Diagnosis not present

## 2016-01-13 DIAGNOSIS — I10 Essential (primary) hypertension: Secondary | ICD-10-CM | POA: Diagnosis not present

## 2016-01-13 DIAGNOSIS — Z79899 Other long term (current) drug therapy: Secondary | ICD-10-CM

## 2016-01-13 DIAGNOSIS — C3412 Malignant neoplasm of upper lobe, left bronchus or lung: Secondary | ICD-10-CM

## 2016-01-13 DIAGNOSIS — R079 Chest pain, unspecified: Secondary | ICD-10-CM | POA: Insufficient documentation

## 2016-01-13 DIAGNOSIS — Z923 Personal history of irradiation: Secondary | ICD-10-CM

## 2016-01-13 DIAGNOSIS — R51 Headache: Secondary | ICD-10-CM | POA: Diagnosis not present

## 2016-01-13 DIAGNOSIS — R519 Headache, unspecified: Secondary | ICD-10-CM

## 2016-01-13 DIAGNOSIS — J449 Chronic obstructive pulmonary disease, unspecified: Secondary | ICD-10-CM | POA: Diagnosis not present

## 2016-01-13 LAB — HEPATIC FUNCTION PANEL
ALBUMIN: 4.2 g/dL (ref 3.5–5.0)
ALK PHOS: 49 U/L (ref 38–126)
ALT: 20 U/L (ref 17–63)
AST: 24 U/L (ref 15–41)
Bilirubin, Direct: <0.1 mg/dL — ABNORMAL LOW (ref 0.1–0.5)
TOTAL PROTEIN: 8.3 g/dL — AB (ref 6.5–8.1)
Total Bilirubin: 0.5 mg/dL (ref 0.3–1.2)

## 2016-01-13 LAB — APTT: aPTT: 30 seconds (ref 24–36)

## 2016-01-13 LAB — CBC WITH DIFFERENTIAL/PLATELET
Basophils Absolute: 0 10*3/uL (ref 0–0.1)
Basophils Relative: 1 %
EOS PCT: 3 %
Eosinophils Absolute: 0.2 10*3/uL (ref 0–0.7)
HCT: 44.8 % (ref 40.0–52.0)
HEMOGLOBIN: 15.2 g/dL (ref 13.0–18.0)
LYMPHS ABS: 2.3 10*3/uL (ref 1.0–3.6)
LYMPHS PCT: 32 %
MCH: 28.4 pg (ref 26.0–34.0)
MCHC: 33.9 g/dL (ref 32.0–36.0)
MCV: 83.8 fL (ref 80.0–100.0)
MONOS PCT: 9 %
Monocytes Absolute: 0.6 10*3/uL (ref 0.2–1.0)
Neutro Abs: 4.1 10*3/uL (ref 1.4–6.5)
Neutrophils Relative %: 55 %
PLATELETS: 204 10*3/uL (ref 150–440)
RBC: 5.35 MIL/uL (ref 4.40–5.90)
RDW: 14.1 % (ref 11.5–14.5)
WBC: 7.3 10*3/uL (ref 3.8–10.6)

## 2016-01-13 LAB — PROTIME-INR
INR: 0.96
Prothrombin Time: 13 seconds (ref 11.4–15.0)

## 2016-01-13 LAB — CREATININE, SERUM
Creatinine, Ser: 0.89 mg/dL (ref 0.61–1.24)
GFR calc Af Amer: >60 mL/min (ref >60)
GFR calc non Af Amer: >60 mL/min (ref >60)

## 2016-01-13 NOTE — Progress Notes (Signed)
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 RUL nodule- 12x10 mm-stable/benign; LEft supraclav LN/ right hilarLN/subcarinal LN.    INTERVAL HISTORY:  This is my first interaction with the patient since I joined the practice September 2016. I reviewed the patient's prior charts/pertinent labs/imaging in detail; findings are summarized above.   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 Progressively getting worse. Patient had been taking meloxicam without any significant improvement.  Patient has chronic mild shortness of breath or chronic cough otherwise no hemoptysis. No significant weight loss.   REVIEW inte OF SYSTEMS:  A complete 10 point review of system is done which is negative except mentioned above/history of present illness.   PAST MEDICAL HISTORY :  Past Medical History  Diagnosis Date  . Myocardial infarct ([facility]   . COPD ([place] obstructive pulmonary disease) ([facility]   . Hypertension   . Diabetes mellitus without complication ([facility]   . Lung cancer, upper lobe ([facility]  . Asthma     PAST SURGICAL HISTORY :   Past Surgical History  Procedure Laterality Date  . Throat surgery      FAMILY HISTORY :   Family History  Problem Relation Age of Onset  . Cancer - Colon Father   . Cancer - Colon Sister     SOCIAL HISTORY:   Social History  Substance Use Topics  . Smoking status: Current Every Day Smoker -- 1.00 packs/day for 60 years     Types: Cigarettes  . Smokeless tobacco: Not on file  . Alcohol Use: 3.6 oz/week    6 Cans of beer per week     Comment: rarely    ALLERGIES:  is allergic to clarithromycin and penicillins.  MEDICATIONS:  Current Outpatient Prescriptions  Medication Sig Dispense Refill  . albuterol (PROAIR HFA) 108 (90 Base) MCG/ACT inhaler Inhale 2 puffs into the lungs every 6 (six) hours as needed.    .Marland Kitchenalbuterol (PROVENTIL) (2.5 MG/3ML) 0.083% nebulizer solution Inhale 3 mLs into the lungs every 12 (twelve) hours as needed.    .Marland Kitchenglucose blood test strip     . metFORMIN (GLUCOPHAGE) 1000 MG tablet Take 1,000 mg by mouth 2 (two) times daily.    .Berniece PapCartilage 500 MG CAPS Take by mouth.     No current facility-administered medications for this visit.    PHYSICAL EXAMINATION: ECOG PERFORMANCE STATUS: 1 - Symptomatic but completely ambulatory  BP 135/67 mmHg  Pulse 73  Temp(Src) 98.1 F (36.7 C) (Tympanic)  Ht '5\' 9"'$  (1.753 m)  Wt 201 lb 11.5 oz (91.5 kg)  BMI 29.78 kg/m2  SpO2 95%  Filed Weights   01/13/16 1507  Weight: 201 lb 11.5 oz (91.5 kg)    GENERAL: Well-nourished well-developed; Alert, no distress and comfortable.   Accompanied by wife /2 daughters.  EYES: no pallor or icterus OROPHARYNX: no thrush or ulceration; poor dentition  NECK: supple, no masses felt LYMPH:  no palpable lymphadenopathy in the cervical,  axillary or inguinal regions LUNGS: Decreased air entry bilaterally.  No wheeze or crackles HEART/CVS: regular rate & rhythm and no murmurs; No lower extremity edema ABDOMEN:abdomen soft, non-tender and normal bowel sounds Musculoskeletal:no cyanosis of digits and no clubbing; tenderness noted in the left chest wall. No lumps or bumps noted. PSYCH: alert & oriented x 3 with fluent speech NEURO: no focal motor/sensory deficits SKIN:  no rashes or significant lesions  LABORATORY DATA:  I have reviewed the data as listed    Component Value Date/Time   NA 137  12/28/2015 1124   K 4.0 12/28/2015 1124   CL 103 12/28/2015 1124   CO2 28 12/28/2015 1124   GLUCOSE 141* 12/28/2015 1124   BUN 13 12/28/2015 1124   CREATININE 0.89 01/13/2016 1439   CREATININE 0.99 02/26/2014 1033   CALCIUM 9.2 12/28/2015 1124   CALCIUM 8.2* 02/26/2014 1033   PROT 8.3* 01/13/2016 1439   PROT 7.3 02/26/2014 1033   ALBUMIN 4.2 01/13/2016 1439   ALBUMIN 3.4 02/26/2014 1033   AST 24 01/13/2016 1439   AST 17 02/26/2014 1033   ALT 20 01/13/2016 1439   ALT 25 02/26/2014 1033   ALKPHOS 49 01/13/2016 1439   ALKPHOS 54 02/26/2014 1033   BILITOT 0.5 01/13/2016 1439   BILITOT 0.4 02/26/2014 1033   GFRNONAA >60 01/13/2016 1439   GFRNONAA >60 02/26/2014 1033   GFRAA >60 01/13/2016 1439   GFRAA >60 02/26/2014 1033    No results found for: SPEP, UPEP  Lab Results  Component Value Date   WBC 7.3 01/13/2016   NEUTROABS 4.1 01/13/2016   HGB 15.2 01/13/2016   HCT 44.8 01/13/2016   MCV 83.8 01/13/2016   PLT 204 01/13/2016      Chemistry      Component Value Date/Time   NA 137 12/28/2015 1124   K 4.0 12/28/2015 1124   CL 103 12/28/2015 1124   CO2 28 12/28/2015 1124   BUN 13 12/28/2015 1124   CREATININE 0.89 01/13/2016 1439   CREATININE 0.99 02/26/2014 1033      Component Value Date/Time   CALCIUM 9.2 12/28/2015 1124   CALCIUM 8.2* 02/26/2014 1033   ALKPHOS 49 01/13/2016 1439   ALKPHOS 54 02/26/2014 1033   AST 24 01/13/2016 1439   AST 17 02/26/2014 1033   ALT 20 01/13/2016 1439   ALT 25 02/26/2014 1033   BILITOT 0.5 01/13/2016 1439   BILITOT 0.4 02/26/2014 1033    IMPRESSION: 1. Recurrent/metastatic disease, as detailed above. Extensive thoracic and lower cervical nodal metastasis. Areas of new masslike opacity within the left upper lobe and superior segment left lower lobe, suspicious for recurrent or metachronous primary bronchogenic carcinoma. 2. Right upper lobe pulmonary nodule is present in 2014, favoring a benign etiology. 3. Atherosclerosis,  including within the coronary arteries. 4. Bilateral adrenal nodularity, similar. Favor adenomas.     ASSESSMENT & PLAN:   # Abnormal CT scan- left upper lobe lesions- as described above; with a history of squamous cell cancer in the past/September 2009. Highly concerning for malignancy-recurrent versus new. Patient needs to have a biopsy of the lesions. I'd also recommend PET scan for further evaluation. Discussed with interventional radiology- most accessible being left supraclavicular lymph node/under ultrasound guidance.  # Discussed with the patient and family that await above workup for further staging/and treatment planning. Prognosis will be discussed once we have the pathology results.  # Left chest wall pain- offered prednisone/patient declined. He'll continue take meloxicam.  # Intermittent headaches check MRI  of the brain.  # Patient follow-up with me in approximately 1 week/few days after the ultrasound and biopsy to review the results next plan of care. I reviewed the images myself reviewed the images with the patient and family in detail.  Thank you Dr.Fleming for allowing me to participate in the care of your pleasant patient. Please do not hesitate to contact me with questions or concerns in the interim.   # 40 minutes face-to-face with the patient discussing the above plan of care; more than 50% of time spent on  counseling and coordination.      Cammie Sickle, MD 01/13/2016 4:59 PM

## 2016-01-17 ENCOUNTER — Encounter
Admission: RE | Admit: 2016-01-17 | Discharge: 2016-01-17 | Disposition: A | Discharge status: Home / Self Care | Payer: Medicare Other | Source: Ambulatory Visit | Attending: Internal Medicine | Admitting: Internal Medicine

## 2016-01-17 ENCOUNTER — Ambulatory Visit
Admission: RE | Admit: 2016-01-17 | Discharge: 2016-01-17 | Disposition: A | Discharge status: Home / Self Care | Payer: Medicare Other | Source: Ambulatory Visit | Attending: Internal Medicine | Admitting: Internal Medicine

## 2016-01-17 DIAGNOSIS — I2510 Atherosclerotic heart disease of native coronary artery without angina pectoris: Secondary | ICD-10-CM | POA: Diagnosis not present

## 2016-01-17 DIAGNOSIS — C3412 Malignant neoplasm of upper lobe, left bronchus or lung: Secondary | ICD-10-CM | POA: Diagnosis present

## 2016-01-17 DIAGNOSIS — C773 Secondary and unspecified malignant neoplasm of axilla and upper limb lymph nodes: Secondary | ICD-10-CM | POA: Diagnosis not present

## 2016-01-17 DIAGNOSIS — C772 Secondary and unspecified malignant neoplasm of intra-abdominal lymph nodes: Secondary | ICD-10-CM | POA: Insufficient documentation

## 2016-01-17 DIAGNOSIS — C7800 Secondary malignant neoplasm of unspecified lung: Secondary | ICD-10-CM | POA: Diagnosis not present

## 2016-01-17 DIAGNOSIS — K5730 Diverticulosis of large intestine without perforation or abscess without bleeding: Secondary | ICD-10-CM | POA: Diagnosis not present

## 2016-01-17 DIAGNOSIS — J439 Emphysema, unspecified: Secondary | ICD-10-CM | POA: Insufficient documentation

## 2016-01-17 DIAGNOSIS — R51 Headache: Secondary | ICD-10-CM | POA: Diagnosis present

## 2016-01-17 DIAGNOSIS — D3501 Benign neoplasm of right adrenal gland: Secondary | ICD-10-CM | POA: Diagnosis not present

## 2016-01-17 DIAGNOSIS — R519 Headache, unspecified: Secondary | ICD-10-CM

## 2016-01-17 DIAGNOSIS — D3502 Benign neoplasm of left adrenal gland: Secondary | ICD-10-CM | POA: Diagnosis not present

## 2016-01-17 LAB — GLUCOSE, CAPILLARY: GLUCOSE-CAPILLARY: 126 mg/dL — AB (ref 65–99)

## 2016-01-17 MED ORDER — GADOBENATE DIMEGLUMINE 529 MG/ML IV SOLN
20.0000 mL | Freq: Once | INTRAVENOUS | Status: AC | PRN
  Administered 2016-01-17: 20 mL via INTRAVENOUS

## 2016-01-17 MED ORDER — FLUDEOXYGLUCOSE F - 18 (FDG) INJECTION
12.7600 | Freq: Once | INTRAVENOUS | Status: AC | PRN
  Administered 2016-01-17: 12.76 via INTRAVENOUS

## 2016-01-18 ENCOUNTER — Ambulatory Visit: Payer: Medicare Other

## 2016-01-18 ENCOUNTER — Other Ambulatory Visit: Payer: Self-pay | Admitting: General Surgery

## 2016-01-19 ENCOUNTER — Ambulatory Visit
Admission: RE | Admit: 2016-01-19 | Discharge: 2016-01-19 | Disposition: A | Discharge status: Home / Self Care | Payer: Medicare Other | Source: Ambulatory Visit | Attending: Internal Medicine | Admitting: Internal Medicine

## 2016-01-19 DIAGNOSIS — Z9221 Personal history of antineoplastic chemotherapy: Secondary | ICD-10-CM | POA: Diagnosis not present

## 2016-01-19 DIAGNOSIS — E119 Type 2 diabetes mellitus without complications: Secondary | ICD-10-CM | POA: Insufficient documentation

## 2016-01-19 DIAGNOSIS — I10 Essential (primary) hypertension: Secondary | ICD-10-CM | POA: Insufficient documentation

## 2016-01-19 DIAGNOSIS — Z9889 Other specified postprocedural states: Secondary | ICD-10-CM | POA: Diagnosis not present

## 2016-01-19 DIAGNOSIS — I252 Old myocardial infarction: Secondary | ICD-10-CM | POA: Diagnosis not present

## 2016-01-19 DIAGNOSIS — J449 Chronic obstructive pulmonary disease, unspecified: Secondary | ICD-10-CM | POA: Diagnosis not present

## 2016-01-19 DIAGNOSIS — I700 Atherosclerosis of aorta: Secondary | ICD-10-CM | POA: Diagnosis not present

## 2016-01-19 DIAGNOSIS — J439 Emphysema, unspecified: Secondary | ICD-10-CM | POA: Insufficient documentation

## 2016-01-19 DIAGNOSIS — Z8673 Personal history of transient ischemic attack (TIA), and cerebral infarction without residual deficits: Secondary | ICD-10-CM | POA: Diagnosis not present

## 2016-01-19 DIAGNOSIS — N281 Cyst of kidney, acquired: Secondary | ICD-10-CM | POA: Insufficient documentation

## 2016-01-19 DIAGNOSIS — R519 Headache, unspecified: Secondary | ICD-10-CM

## 2016-01-19 DIAGNOSIS — I2510 Atherosclerotic heart disease of native coronary artery without angina pectoris: Secondary | ICD-10-CM | POA: Insufficient documentation

## 2016-01-19 DIAGNOSIS — R51 Headache: Secondary | ICD-10-CM

## 2016-01-19 DIAGNOSIS — K5730 Diverticulosis of large intestine without perforation or abscess without bleeding: Secondary | ICD-10-CM | POA: Insufficient documentation

## 2016-01-19 DIAGNOSIS — D3502 Benign neoplasm of left adrenal gland: Secondary | ICD-10-CM | POA: Diagnosis not present

## 2016-01-19 DIAGNOSIS — D3501 Benign neoplasm of right adrenal gland: Secondary | ICD-10-CM | POA: Insufficient documentation

## 2016-01-19 DIAGNOSIS — F17210 Nicotine dependence, cigarettes, uncomplicated: Secondary | ICD-10-CM | POA: Diagnosis not present

## 2016-01-19 DIAGNOSIS — Z923 Personal history of irradiation: Secondary | ICD-10-CM | POA: Diagnosis not present

## 2016-01-19 DIAGNOSIS — Z85118 Personal history of other malignant neoplasm of bronchus and lung: Secondary | ICD-10-CM | POA: Diagnosis present

## 2016-01-19 DIAGNOSIS — J45909 Unspecified asthma, uncomplicated: Secondary | ICD-10-CM | POA: Diagnosis not present

## 2016-01-19 DIAGNOSIS — C3412 Malignant neoplasm of upper lobe, left bronchus or lung: Secondary | ICD-10-CM

## 2016-01-19 DIAGNOSIS — R599 Enlarged lymph nodes, unspecified: Secondary | ICD-10-CM | POA: Insufficient documentation

## 2016-01-19 MED ORDER — FENTANYL CITRATE (PF) 100 MCG/2ML IJ SOLN
INTRAMUSCULAR | Status: AC | PRN
  Administered 2016-01-19: 50 ug via INTRAVENOUS

## 2016-01-19 MED ORDER — MIDAZOLAM HCL 5 MG/5ML IJ SOLN
INTRAMUSCULAR | Status: AC | PRN
  Administered 2016-01-19: 1 mg via INTRAVENOUS
  Administered 2016-01-19: 0.5 mg via INTRAVENOUS

## 2016-01-19 MED ORDER — SODIUM CHLORIDE 0.9 % IV SOLN
INTRAVENOUS | Status: DC
  Administered 2016-01-19: 08:00:00 via INTRAVENOUS

## 2016-01-19 NOTE — Consult Note (Signed)
Chief Complaint: History of lung cancer, now with hypermetabolic left significant lymphadenopathy worrisome for metastatic disease  Referring Physician(s): Brahmanday,Govinda R  History of Present Illness: Richard Jimenez is a 75 y.o. male with past medical history significant for lung cancer, now with hypermetabolic left super clavicular lymphadenopathy demonstrated on PET/CT performed 01/17/2016. Patient presents today to the interventional radiology department for ultrasound-guided left subclavicular lymph node biopsy for tissue diagnostic purposes. He is accompanied by his wife and 2 daughters though serves as his own historian.  Patient reports chronic left lateral mid chest wall pain. He has reports several recent episodes of small hemoptysis. He denies fever or chills. No new chest pain.  Past Medical History  Diagnosis Date  . Myocardial infarct ([place])   . COPD (chronic obstructive pulmonary disease) ([place])   . Hypertension   . Diabetes mellitus without complication ([place])   . Lung cancer, upper lobe ([place]) 2009  . Asthma     Past Surgical History  Procedure Laterality Date  . Throat surgery    . Tumor removal Right     "years ago-right lower posterior side"    Allergies: Clarithromycin and Penicillins  Medications: Prior to Admission medications   Medication Sig Start Date End Date Taking? Authorizing Provider  albuterol (PROAIR HFA) 108 (90 Base) MCG/ACT inhaler Inhale 2 puffs into the lungs every 6 (six) hours as needed.  05/31/16 Yes Historical Provider, MD  albuterol (PROVENTIL) (2.5 MG/3ML) 0.083% nebulizer solution Inhale 3 mLs into the lungs every 12 (twelve) hours as needed.   Yes Historical Provider, MD  meloxicam (MOBIC) 7.5 MG tablet Take 7.5 mg by mouth 2 (two) times daily.   Yes Historical Provider, MD  metFORMIN (GLUCOPHAGE) 1000 MG tablet Take 1,000 mg by mouth 2 (two) times daily.  08/08/16 Yes Historical Provider, MD  Shark Cartilage 500 MG CAPS Take by  mouth.   Yes Historical Provider, MD  glucose blood test strip  12/13/15   Historical Provider, MD     Family History  Problem Relation Age of Onset  . Cancer - Colon Father   . Cancer - Colon Sister     Social History   Social History  . Marital Status: Married    Spouse Name: N/A  . Number of Children: N/A  . Years of Education: N/A   Social History Main Topics  . Smoking status: Current Every Day Smoker -- 1.00 packs/day for 60 years    Types: Cigarettes  . Smokeless tobacco: None  . Alcohol Use: 3.6 oz/week    6 Cans of beer per week     Comment: rarely  . Drug Use: No  . Sexual Activity: Not Asked   Other Topics Concern  . None   Social History Narrative    ECOG Status: 1 - Symptomatic but completely ambulatory  Review of Systems: A 12 point ROS discussed and pertinent positives are indicated in the HPI above.  All other systems are negative.  Review of Systems  Constitutional: Negative for fever, activity change, appetite change and fatigue.  Respiratory: Positive for cough.        Patient admits to small volume hemoptysis.  Cardiovascular: Positive for chest pain. Negative for palpitations and leg swelling.  Gastrointestinal: Negative.     Vital Signs: BP 147/77 mmHg  Pulse 80  Temp(Src) 98 F (36.7 C) (Oral)  Resp 24  SpO2 99%  Physical Exam  Constitutional: He appears well-developed and well-nourished.  Cardiovascular: Normal rate and regular rhythm.  Pulmonary/Chest: Effort normal. He exhibits tenderness.  Expiratory wheezes heard bilaterally, left greater than right. Decreased lung sounds within the bilateral lung bases.  Psychiatric: He has a normal mood and affect. His behavior is normal.  Nursing note reviewed.   Imaging: Dg Chest 2 View  12/28/2015  CLINICAL DATA:  Left-sided chest pain.  Left upper lobe lung cancer. EXAM: CHEST  2 VIEW COMPARISON:  Chest CT dated 01/20/2013 and PET-CT dated 02/17/2013 and chest x-ray dated 03/06/2011  FINDINGS: Heart size and pulmonary vascularity are normal. There is chronic scarring and calcification at the left lung apex with chronic superior retraction of the left hilum. Emphysematous hyperinflation of the lungs. No osseous abnormality. IMPRESSION: No acute abnormality. Emphysema. Stable scarring in the left lung apex. Electronically Signed   By: Lorriane Shire M.D.   On: 12/28/2015 11:53   Ct Chest W Contrast  01/09/2016  CLINICAL DATA:  Left upper chest pain for 16 months. COPD. Left lung cancer in 2009 with chemotherapy and radiation therapy. Smoker. EXAM: CT CHEST WITH CONTRAST TECHNIQUE: Multidetector CT imaging of the chest was performed during intravenous contrast administration. CONTRAST:  13m ISOVUE-300 IOPAMIDOL (ISOVUE-300) INJECTION 61% COMPARISON:  12/28/2015 chest radiograph. Most recent CT 01/20/2013. PET of 02/17/2013. FINDINGS: Mediastinum/Nodes: New left supraclavicular adenopathy, including at 1.8 cm on image 9/series 2. Aortic and branch vessel atherosclerosis. Normal heart size, without pericardial effusion. Multivessel coronary artery atherosclerosis. No central pulmonary embolism, on this non-dedicated study. 11 mm subcarinal node is upper normal size but enlarged 7 mm on the prior. New right hilar adenopathy, include at 1.9 cm on image 28/series 2. Periesophageal/low left mediastinal adenopathy at 2.1 cm on image 37/series 2. Nodal metastasis in the left cardiophrenic angle on image 50/series 2. Lungs/Pleura: No pleural fluid. Mild centrilobular and paraseptal emphysema. A right upper lobe pulmonary nodule measures 13 x 9 mm on image 19/ series 3. Grossly similar to 12 x 10 mm on image 31 of the 2014 exam. Pleural-based left upper lobe pulmonary nodule versus less likely pericardial node. This is new, measures 12 mm on image 32/series 3. Re- demonstration of left apical and posterior left upper lobe consolidation with air bronchograms, likely due to prior radiation. Areas of new  masslike opacity within the anterior left upper lobe at 4.1 x 3.5 cm on image 17/series 3. Also within the superior most aspect of the left upper lobe at 3.3 x 3.0 cm on image 13/series 3, new. Upper abdomen: Normal imaged portions of the liver, spleen, stomach, pancreas, left kidney. Incompletely imaged low-density right renal lesion is likely a cyst. Right adrenal nodularity is unchanged. 1.8 cm left adrenal nodule is similar and low density, favoring an adenoma. Abdominal aortic atherosclerosis. Musculoskeletal: No acute osseous abnormality. IMPRESSION: 1. Recurrent/metastatic disease, as detailed above. Extensive thoracic and lower cervical nodal metastasis. Areas of new masslike opacity within the left upper lobe and superior segment left lower lobe, suspicious for recurrent or metachronous primary bronchogenic carcinoma. 2. Right upper lobe pulmonary nodule is present in 2014, favoring a benign etiology. 3.  Atherosclerosis, including within the coronary arteries. 4. Bilateral adrenal nodularity, similar.  Favor adenomas. These results will be called to the ordering clinician or representative by the Radiologist Assistant, and communication documented in the PACS or zVision Dashboard. Electronically Signed   By: KAbigail MiyamotoM.D.   On: 01/09/2016 14:36   Mr BJeri CosWGYContrast  01/17/2016  CLINICAL DATA:  History of lung cancer. New recurrence with supraclavicular lymph nodes. Evaluate for  metastases. EXAM: MRI HEAD WITHOUT AND WITH CONTRAST TECHNIQUE: Multiplanar, multiecho pulse sequences of the brain and surrounding structures were obtained without and with intravenous contrast. CONTRAST:  44m MULTIHANCE GADOBENATE DIMEGLUMINE 529 MG/ML IV SOLN COMPARISON:  None. FINDINGS: Calvarium and upper cervical spine: No focal marrow signal abnormality. Orbits: Negative. Sinuses and Mastoids: Clear. Brain: No evidence of metastatic disease. No acute infarct, hemorrhage, hydrocephalus, or mass lesion. No evidence  of large vessel occlusion. Mild for age small-vessel ischemic changes in the deep cerebral white matter. There has been a remote lacunar infarct in the right thalamus. Incidental mega cisterna magna. IMPRESSION: Negative brain MRI for metastatic disease. Electronically Signed   By: JMonte FantasiaM.D.   On: 01/17/2016 11:41   Nm Pet Image Initial (pi) Skull Base To Thigh  01/17/2016  CLINICAL DATA:  Subsequent treatment strategy for recurrent left upper lobe squamous cell lung carcinoma detected on recent chest CT performed in the setting of left chest wall pain, initially diagnosed in 2009 and treated with chemotherapy and radiation therapy. EXAM: NUCLEAR MEDICINE PET SKULL BASE TO THIGH TECHNIQUE: 12.8 mCi F-18 FDG was injected intravenously. Full-ring PET imaging was performed from the skull base to thigh after the radiotracer. CT data was obtained and used for attenuation correction and anatomic localization. FASTING BLOOD GLUCOSE:  Value: 126 mg/dl COMPARISON:  01/09/2016 chest CT and 02/17/2013 PET-CT. FINDINGS: NECK There are a few hypermetabolic left level 4 lymph nodes, the largest measuring 1.7 cm just lateral to the left common carotid artery with max SUV 8.4 (series 3/ image 67). There is bulky confluent low left level 5 neck lymphadenopathy, the largest measuring 2.5 cm with max SUV 8.3 (series 3/ image 54). No hypermetabolic right neck lymph nodes. CHEST Hypermetabolic 1.1 cm mildly enlarged left axillary lymph node with max SUV 3.6 (series 3/ image 70). No hypermetabolic right axillary adenopathy. Enlarged 1.9 cm hypermetabolic left pericardiophrenic lymph node with max SUV 5.3 (series 3/image 135). Enlarged 2.2 cm hypermetabolic left lower mediastinal lymph node between the heart and descending thoracic aorta (series 3/image 112) with max SUV 9.6. Hypermetabolic bilateral hilar lymphadenopathy including an enlarged 2.2 cm hypermetabolic right hilar node with max SUV 7.6 (series 3/ image 100) and  a mildly enlarged 1.1 cm hypermetabolic left hilar node with max SUV 6.0 (series 3/image 92). Hypermetabolic 5.2 x 3.5 cm anterior medial left upper lobe lung mass with max SUV 8.6 (series 3/image 77). Hypermetabolic 3.3 x 2.6 cm superior segment left lower lobe lung mass with max SUV 8.8 (series 3/image 76). Hypermetabolic pleural-based 1.9 x 1.4 cm pulmonary nodule in the medial left upper lobe with max SUV 4.5 (series 3/image 102). Hypermetabolic 1.8 x 1.2 cm pleural-based nodule in the medial left lower lobe with max SUV 7.0 (series 3/image 81). Mild centrilobular and paraseptal emphysema and diffuse bronchial wall thickening. Non hypermetabolic 1.3 cm right upper lobe pulmonary nodule (series 3/image 84), which is only minimally increased in size from 1.0 cm on 03/04/2008, consistent with a benign nodule. Left main, left anterior descending, left circumflex and right coronary atherosclerosis. Dilated main pulmonary artery (3.3 cm diameter). Atherosclerotic nonaneurysmal thoracic aorta. ABDOMEN/PELVIS No abnormal hypermetabolic activity within the liver, pancreas, adrenal glands, or spleen. No hypermetabolic lymph nodes in the abdomen or pelvis. Stable non hypermetabolic bilateral adrenal adenomas measuring 1.3 cm on the right and 2.2 cm on the left. Simple 1.2 cm posterior lower left renal cyst. Simple 1.8 cm medial interpolar right renal cyst. Mild sigmoid diverticulosis. Mild prostatomegaly with  nonspecific internal prostatic calcifications. SKELETON No focal hypermetabolic activity to suggest skeletal metastasis. IMPRESSION: 1. Four hypermetabolic left upper and left lower lobe lung metastases. 2. Hypermetabolic left axillary, left mediastinal and bilateral hilar nodal metastases. 3. Hypermetabolic left level 4 and left level 5 neck nodal metastases. 4. No hypermetabolic metastatic disease in the abdomen or pelvis. 5. Additional findings include coronary atherosclerosis, mild emphysema with diffuse bronchial  wall thickening suggesting COPD, dilated main pulmonary artery suggesting pulmonary artery hypertension, bilateral adrenal adenomas, mild sigmoid diverticulosis and mild prostatomegaly. Electronically Signed   By: Ilona Sorrel M.D.   On: 01/17/2016 11:07    Labs:  CBC:  Recent Labs  03/11/15 1020 12/28/15 1124 01/13/16 1439  WBC 6.3 7.5 7.3  HGB 13.7 14.7 15.2  HCT 41.1 44.4 44.8  PLT 183 198 204    COAGS:  Recent Labs  01/13/16 1601  INR 0.96  APTT 30    BMP:  Recent Labs  03/11/15 1020 12/28/15 1124 01/13/16 1439  NA  --  137  --   K  --  4.0  --   CL  --  103  --   CO2  --  28  --   GLUCOSE  --  141*  --   BUN  --  13  --   CALCIUM 8.4* 9.2  --   CREATININE 1.03 0.88 0.89  GFRNONAA >60 >60 >60  GFRAA >60 >60 >60    LIVER FUNCTION TESTS:  Recent Labs  03/11/15 1020 01/13/16 1439  BILITOT 0.5 0.5  AST 22 24  ALT 20 20  ALKPHOS 45 49  PROT 6.9 8.3*  ALBUMIN 3.7 4.2    Assessment and Plan:  History of lung cancer with new hypermetabolic left supra and clavicular lymphadenopathy worrisome for metastatic disease.  Risks and Benefits of ultrasound-guided left superior clavicular lymph node biopsy were discussed with the patient including, but not limited to bleeding, infection, damage to adjacent structures or low yield requiring additional tests.  All of the patient's and the patient's family's questions were answered, patient is agreeable to proceed.  Consent signed and in chart.   A copy of this report was sent to the requesting provider on this date.  Electronically Signed: Sandi Mariscal 01/19/2016, 9:25 AM   I spent a total of 15 Minutes in face to face in clinical consultation, greater than 50% of which was counseling/coordinating care for Ultrasound guided left significant lymph node biopsy.

## 2016-01-19 NOTE — Procedures (Signed)
Technically successful US guided biopsy of left subclavicular lymph node.   EBL: Minimal   No immediate complications.   Ronny Bacon, MD Pager #: [phone]

## 2016-02-03 LAB — SURGICAL PATHOLOGY

## 2016-02-06 ENCOUNTER — Inpatient Hospital Stay: Payer: Medicare Other | Attending: Internal Medicine | Admitting: Internal Medicine

## 2016-02-06 VITALS — BP 110/71 | HR 78 | Temp 96.8°F | Resp 18 | Wt 193.6 lb

## 2016-02-06 DIAGNOSIS — E119 Type 2 diabetes mellitus without complications: Secondary | ICD-10-CM | POA: Insufficient documentation

## 2016-02-06 DIAGNOSIS — C3412 Malignant neoplasm of upper lobe, left bronchus or lung: Secondary | ICD-10-CM | POA: Diagnosis not present

## 2016-02-06 DIAGNOSIS — Z7984 Long term (current) use of oral hypoglycemic drugs: Secondary | ICD-10-CM | POA: Insufficient documentation

## 2016-02-06 DIAGNOSIS — R918 Other nonspecific abnormal finding of lung field: Secondary | ICD-10-CM | POA: Insufficient documentation

## 2016-02-06 DIAGNOSIS — R630 Anorexia: Secondary | ICD-10-CM | POA: Diagnosis not present

## 2016-02-06 DIAGNOSIS — G893 Neoplasm related pain (acute) (chronic): Secondary | ICD-10-CM | POA: Insufficient documentation

## 2016-02-06 DIAGNOSIS — Z79899 Other long term (current) drug therapy: Secondary | ICD-10-CM | POA: Diagnosis not present

## 2016-02-06 DIAGNOSIS — J449 Chronic obstructive pulmonary disease, unspecified: Secondary | ICD-10-CM | POA: Diagnosis not present

## 2016-02-06 DIAGNOSIS — J45909 Unspecified asthma, uncomplicated: Secondary | ICD-10-CM | POA: Diagnosis not present

## 2016-02-06 DIAGNOSIS — I252 Old myocardial infarction: Secondary | ICD-10-CM | POA: Diagnosis not present

## 2016-02-06 DIAGNOSIS — Z800 Family history of malignant neoplasm of digestive organs: Secondary | ICD-10-CM | POA: Diagnosis not present

## 2016-02-06 DIAGNOSIS — C773 Secondary and unspecified malignant neoplasm of axilla and upper limb lymph nodes: Secondary | ICD-10-CM | POA: Insufficient documentation

## 2016-02-06 DIAGNOSIS — R51 Headache: Secondary | ICD-10-CM | POA: Diagnosis not present

## 2016-02-06 DIAGNOSIS — F17210 Nicotine dependence, cigarettes, uncomplicated: Secondary | ICD-10-CM | POA: Diagnosis not present

## 2016-02-06 DIAGNOSIS — C3490 Malignant neoplasm of unspecified part of unspecified bronchus or lung: Secondary | ICD-10-CM | POA: Insufficient documentation

## 2016-02-06 DIAGNOSIS — Z9221 Personal history of antineoplastic chemotherapy: Secondary | ICD-10-CM | POA: Insufficient documentation

## 2016-02-06 DIAGNOSIS — Z923 Personal history of irradiation: Secondary | ICD-10-CM | POA: Insufficient documentation

## 2016-02-06 DIAGNOSIS — I10 Essential (primary) hypertension: Secondary | ICD-10-CM | POA: Insufficient documentation

## 2016-02-06 DIAGNOSIS — Z5111 Encounter for antineoplastic chemotherapy: Secondary | ICD-10-CM | POA: Diagnosis not present

## 2016-02-06 DIAGNOSIS — I2510 Atherosclerotic heart disease of native coronary artery without angina pectoris: Secondary | ICD-10-CM | POA: Diagnosis not present

## 2016-02-06 DIAGNOSIS — C3492 Malignant neoplasm of unspecified part of left bronchus or lung: Secondary | ICD-10-CM

## 2016-02-06 NOTE — Progress Notes (Signed)
Patient here as new evaluation regarding lung cancer. Referred by Dr. Gilford Rile. Patient has has PET and CT scan as well as MRI and lymph node biopsy all in the last 3-4 weeks.

## 2016-02-06 NOTE — Progress Notes (Signed)
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   Patient continues to complain of pain in his left shoulder blade area. He has been on tramadol slight improvement.  Patient has chronic mild shortness of breath or chronic cough otherwise no hemoptysis. No significant weight loss. No hoarseness of was. No headaches.   REVIEW inte OF SYSTEMS:  A complete 10 point review of system is done which is negative except mentioned above/history of present illness.   PAST MEDICAL HISTORY :  Past Medical History  Diagnosis Date  . [place]   . COPD (chronic obstructive pulmonary disease) ([place]   . Hypertension   . Diabetes mellitus without complication ([facility]   . Lung cancer, upper lobe ([place] 2009  . Asthma     PAST SURGICAL HISTORY :   Past Surgical History  Procedure Laterality Date  . Throat surgery    . Tumor removal Right     "years ago-right lower posterior side"    FAMILY HISTORY :   Family History  Problem Relation Age of Onset  . Cancer - Colon Father   . Cancer - Colon Sister     SOCIAL HISTORY:   Social History  Substance Use Topics  . Smoking status: Current Every Day Smoker -- 1.00 packs/day for 60 years    Types: Cigarettes  . Smokeless tobacco: Not on file  . Alcohol Use: 3.6  oz/week    6 Cans of beer per week     Comment: rarely    ALLERGIES:  is allergic to clarithromycin and penicillins.  MEDICATIONS:  Current Outpatient Prescriptions  Medication Sig Dispense Refill  . albuterol (PROAIR HFA) 108 (90 Base) MCG/ACT inhaler Inhale 2 puffs into the lungs every 6 (six) hours as needed.    .Marland Kitchenalbuterol (PROVENTIL) (2.5 MG/3ML) 0.083% nebulizer solution Inhale 3 mLs into the lungs every 12 (twelve) hours as needed.    .Marland Kitchenglucose blood test strip     . meloxicam (MOBIC) 7.5 MG tablet Take 7.5 mg by mouth 2 (two) times daily.    . metFORMIN (GLUCOPHAGE) 1000 MG tablet Take 1,000 mg by mouth 2 (two) times daily.    .Berniece PapCartilage 500 MG CAPS Take by mouth.    . traMADol (ULTRAM) 50 MG tablet Take 50 mg by mouth every 6 (six) hours as needed. for pain  3   No current facility-administered medications for this visit.    PHYSICAL EXAMINATION: ECOG PERFORMANCE STATUS: 1 - Symptomatic but completely ambulatory  BP 110/71 mmHg  Pulse 78  Temp(Src) 96.8 F (36 C) (Tympanic)  Resp 18  Wt 193 lb 9 oz (87.8 kg)  Filed Weights   02/06/16 1345  Weight: 193 lb 9 oz (87.8 kg)    GENERAL: Well-nourished well-developed; Alert, no distress and comfortable.   Accompanied by wife /2 daughters.  EYES: no pallor or icterus OROPHARYNX: no thrush or ulceration; poor dentition  NECK: supple, no masses felt LYMPH:  no palpable lymphadenopathy in the cervical, axillary or inguinal regions LUNGS: Decreased air entry bilaterally.  No wheeze or crackles HEART/CVS: regular rate & rhythm and no murmurs; No lower extremity edema ABDOMEN:abdomen soft, non-tender and normal bowel sounds Musculoskeletal:no cyanosis of digits and no clubbing; tenderness noted in the left chest wall. No lumps or bumps noted. PSYCH: alert & oriented x 3 with fluent speech NEURO: no focal motor/sensory deficits SKIN:  no rashes or significant lesions  LABORATORY DATA:  I have reviewed the data as  listed    Component Value Date/Time   NA 137 12/28/2015 1124   K 4.0 12/28/2015 1124   CL 103 12/28/2015 1124   CO2 28 12/28/2015 1124   GLUCOSE 141* 12/28/2015 1124   BUN 13 12/28/2015 1124   CREATININE 0.89 01/13/2016 1439   CREATININE 0.99 02/26/2014 1033   CALCIUM 9.2 12/28/2015 1124   CALCIUM 8.2* 02/26/2014 1033   PROT 8.3* 01/13/2016 1439   PROT 7.3 02/26/2014 1033   ALBUMIN 4.2 01/13/2016 1439   ALBUMIN 3.4 02/26/2014 1033   AST 24 01/13/2016 1439   AST 17 02/26/2014 1033   ALT 20 01/13/2016 1439   ALT 25 02/26/2014 1033   ALKPHOS 49 01/13/2016 1439   ALKPHOS 54 02/26/2014 1033   BILITOT 0.5 01/13/2016 1439   BILITOT 0.4 02/26/2014 1033   GFRNONAA >60 01/13/2016 1439   GFRNONAA >60 02/26/2014 1033   GFRAA >60 01/13/2016 1439   GFRAA >60 02/26/2014 1033    No results found for: SPEP, UPEP  Lab Results  Component Value Date   WBC 7.3 01/13/2016   NEUTROABS 4.1 01/13/2016   HGB 15.2 01/13/2016   HCT 44.8 01/13/2016   MCV 83.8 01/13/2016   PLT 204 01/13/2016      Chemistry      Component Value Date/Time   NA 137 12/28/2015 1124   K 4.0 12/28/2015 1124   CL 103 12/28/2015 1124   CO2 28 12/28/2015 1124   BUN 13 12/28/2015 1124   CREATININE 0.89 01/13/2016 1439   CREATININE 0.99 02/26/2014 1033      Component Value Date/Time   CALCIUM 9.2 12/28/2015 1124   CALCIUM 8.2* 02/26/2014 1033   ALKPHOS 49 01/13/2016 1439   ALKPHOS 54 02/26/2014 1033   AST 24 01/13/2016 1439   AST 17 02/26/2014 1033   ALT 20 01/13/2016 1439   ALT 25 02/26/2014 1033   BILITOT 0.5 01/13/2016 1439   BILITOT 0.4 02/26/2014 1033    IMPRESSION: 1. Recurrent/metastatic disease, as detailed above. Extensive thoracic and lower cervical nodal metastasis. Areas of new masslike opacity within the left upper lobe and superior segment left lower lobe, suspicious for recurrent or metachronous primary bronchogenic carcinoma. 2. Right upper lobe pulmonary nodule is present in 2014,  favoring a benign etiology. 3. Atherosclerosis, including within the coronary arteries. 4. Bilateral adrenal nodularity, similar. Favor adenomas.   ASSESSMENT & PLAN:   # Small cell lung cancer- Extensive stage [left axillary lymph node metastases]-Recommend palliative chemotherapy with carboplatin-Etoposide every 3 weeks. Based upon response to chemotherapy recommend radiation to the chest. Discussed with Dr. Donella Stade.  Discussed the potential side effects including but not limited to-increasing fatigue, nausea vomiting, diarrhea, hair loss, sores in the mouth, increase risk of infection and also neuropathy.  Growth factor-Neulasta/On pro would be given as prophylaxis for chemotherapy-induced neutropenia to prevent febrile neutropenias.   #  Also discussed the median expectancy of extensive stage lung disease is approximately one year; and if untreated- left expectancy is few months.   # Also reviewed with the patient the clinical trial-maintenance for small cell lung cancer- randomizes patient to placebo versus Drug antibody conjugate.   # Left chest wall pain- sec to malignancy. Discussed pal RT/chemo.   # Intermittent headaches-MRI brain negative. I reviewed the PET scan images myself and with the family.  # After lengthy discussion patient- states that he needs time to make a decision if he wants to proceed with chemotherapy. He also understands that he will need to have chemotherapy education/Medipore placed.  Patient will call us regarding with his decision as soon as possible.   # I also spoke to patient's pulmonologist Dr. Raul Del updated of the plan. He agrees. Chemotherapy orders placed.  # 40 minutes face-to-face with the patient discussing the above plan of care; more than 50% of time spent on prognosis/ natural history; counseling and coordination.      Cammie Sickle, MD 02/06/2016 4:36 PM

## 2016-02-07 ENCOUNTER — Other Ambulatory Visit: Payer: Self-pay | Admitting: *Deleted

## 2016-02-07 ENCOUNTER — Other Ambulatory Visit: Payer: Self-pay | Admitting: Internal Medicine

## 2016-02-07 ENCOUNTER — Telehealth: Payer: Self-pay | Admitting: *Deleted

## 2016-02-07 DIAGNOSIS — C3412 Malignant neoplasm of upper lobe, left bronchus or lung: Secondary | ICD-10-CM

## 2016-02-07 DIAGNOSIS — C3492 Malignant neoplasm of unspecified part of left bronchus or lung: Secondary | ICD-10-CM

## 2016-02-07 MED ORDER — ONDANSETRON HCL 8 MG PO TABS: 8.0000 mg | ORAL_TABLET | Freq: Three times a day (TID) | ORAL | Status: DC | PRN

## 2016-02-07 MED ORDER — PROCHLORPERAZINE MALEATE 10 MG PO TABS: 10.0000 mg | ORAL_TABLET | Freq: Four times a day (QID) | ORAL | Status: DC | PRN

## 2016-02-07 NOTE — Telephone Encounter (Signed)
Called to ask that Dr B go ahead and schedule him for chemo

## 2016-02-07 NOTE — Telephone Encounter (Signed)
I will have schedulers arrange the chemotherapy class, lab/md/chemo apt. I will also see if a port a cath can be placed this week by vascular.  If port can not be placed this week, pt can receive the first chemotherapy w/o a port per Dr. Rogue Bussing.  Md would like to get started on chemotherapy as soon as possible next week.

## 2016-02-10 ENCOUNTER — Ambulatory Visit
Admission: RE | Admit: 2016-02-10 | Discharge: 2016-02-10 | Disposition: A | Discharge status: Home / Self Care | Payer: Medicare Other | Source: Ambulatory Visit | Attending: Radiation Oncology | Admitting: Radiation Oncology

## 2016-02-10 ENCOUNTER — Encounter: Payer: Self-pay | Admitting: Radiation Oncology

## 2016-02-10 VITALS — BP 112/61 | HR 76 | Temp 97.0°F | Ht 69.0 in | Wt 193.2 lb

## 2016-02-10 DIAGNOSIS — C773 Secondary and unspecified malignant neoplasm of axilla and upper limb lymph nodes: Secondary | ICD-10-CM | POA: Insufficient documentation

## 2016-02-10 DIAGNOSIS — Z510 Encounter for antineoplastic radiation therapy: Secondary | ICD-10-CM | POA: Insufficient documentation

## 2016-02-10 DIAGNOSIS — C3412 Malignant neoplasm of upper lobe, left bronchus or lung: Secondary | ICD-10-CM | POA: Insufficient documentation

## 2016-02-10 DIAGNOSIS — F17210 Nicotine dependence, cigarettes, uncomplicated: Secondary | ICD-10-CM | POA: Insufficient documentation

## 2016-02-10 DIAGNOSIS — C3492 Malignant neoplasm of unspecified part of left bronchus or lung: Secondary | ICD-10-CM

## 2016-02-10 NOTE — Consult Note (Signed)
Except an outstanding is perfect of Radiation Oncology NEW PATIENT EVALUATION  Name: Richard Jimenez  MRN: [id number]  Date:   02/10/2016     DOB: 11/25/1940   This 75 y.o. male patient presents to the clinic for initial evaluation of extensive stage small cell lung cancer with significant left shoulder pain and large supraclavicular mass and patient previously treated 8 years prior for non-small cell left sided lung cancer squamous cell carcinoma stage IIIB.  REFERRING PHYSICIAN: Madelyn Brunner, MD  CHIEF COMPLAINT:  Chief Complaint  Patient presents with  . Lung Cancer    Initial evaluation of radiation therapy for lung cancer    DIAGNOSIS: The encounter diagnosis was Small cell lung cancer, left ([place]).   PREVIOUS INVESTIGATIONS:  PET CT scan is reviewed Pathology report reviewed Clinical notes reviewed Case presented at weekly tumor conference  HPI: Patient is a 76 year old male well known to our department having been treated 8 years prior for stage IIIB squamous cell carcinoma of the left lung status post concurrent chemotherapy therapy and radiation. He recently presented with increasing significant left shoulder blade pain currently on tramadol. CT scan demonstrated recurrent disease with extensive thoracic and lower cervical nodal metastasis and a masslike opacity in left upper lobe supraclavicular adenopathy. PET CT scan confirmed hypermetabolic activity in the left upper lobe and left lower lobe with hypermetabolic left axillary left mediastinal and bilateral hilar nodal metastasis. 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 His case was presented at our weekly tumor conference and he is scheduled to start chemotherapy next week. He is seen today for his narcotic dependent left shoulder pain. MRI of his brain was negative for metastatic  disease.  PLANNED TREATMENT REGIMEN: Palliative therapy to his left supraclavicular region.  PAST MEDICAL HISTORY:  has a past medical history of Myocardial infarct [facility] [place] COPD (chronic obstructive pulmonary disease) ([place]); Hypertension; Diabetes mellitus without complication ([place]); Lung cancer, upper lobe ([place]) (2009); and Asthma.    PAST SURGICAL HISTORY:  Past Surgical History  Procedure Laterality Date  . Throat surgery    . Tumor removal Right     "years ago-right lower posterior side"    FAMILY HISTORY: family history includes Cancer - Colon in his father and sister.  SOCIAL HISTORY:  reports that he has been smoking Cigarettes.  He has a 60 pack-year smoking history. He does not have any smokeless tobacco history on file. He reports that he drinks about 3.6 oz of alcohol per week. He reports that he does not use illicit drugs.  ALLERGIES: Clarithromycin and Penicillins  MEDICATIONS:  Current Outpatient Prescriptions  Medication Sig Dispense Refill  . albuterol (PROAIR HFA) 108 (90 Base) MCG/ACT inhaler Inhale 2 puffs into the lungs every 6 (six) hours as needed.    Marland Kitchen albuterol (PROVENTIL) (2.5 MG/3ML) 0.083% nebulizer solution Inhale 3 mLs into the lungs every 12 (twelve) hours as needed.    Marland Kitchen glucose blood test strip     . meloxicam (MOBIC) 7.5 MG tablet Take 7.5 mg by mouth 2 (two) times daily.    . metFORMIN (GLUCOPHAGE) 1000 MG tablet Take 1,000 mg by mouth 2 (two) times daily.    . ondansetron (ZOFRAN) 8 MG tablet Take 1 tablet (8 mg total) by mouth every 8 (eight) hours as needed for nausea or vomiting (start 3 days; after chemo). 40 tablet 0  . prochlorperazine (COMPAZINE) 10 MG tablet Take  1 tablet (10 mg total) by mouth every 6 (six) hours as needed for nausea or vomiting. 30 tablet 0  . Shark Cartilage 500 MG CAPS Take by mouth.    . traMADol (ULTRAM) 50 MG tablet Take 50 mg by mouth every 6 (six) hours as needed. for pain  3   No current facility-administered  medications for this encounter.    ECOG PERFORMANCE STATUS:  1 - Symptomatic but completely ambulatory  REVIEW OF SYSTEMS: Except for the left shoulder pain mild chronic nonproductive cough Patient denies any weight loss, fatigue, weakness, fever, chills or night sweats. Patient denies any loss of vision, blurred vision. Patient denies any ringing  of the ears or hearing loss. No irregular heartbeat. Patient denies heart murmur or history of fainting. Patient denies any chest pain or pain radiating to her upper extremities. Patient denies any shortness of breath, difficulty breathing at night, cough or hemoptysis. Patient denies any swelling in the lower legs. Patient denies any nausea vomiting, vomiting of blood, or coffee ground material in the vomitus. Patient denies any stomach pain. Patient states has had normal bowel movements no significant constipation or diarrhea. Patient denies any dysuria, hematuria or significant nocturia. Patient denies any problems walking, swelling in the joints or loss of balance. Patient denies any skin changes, loss of hair or loss of weight. Patient denies any excessive worrying or anxiety or significant depression. Patient denies any problems with insomnia. Patient denies excessive thirst, polyuria, polydipsia. Patient denies any swollen glands, patient denies easy bruising or easy bleeding. Patient denies any recent infections, allergies or URI. Patient "s visual fields have not changed significantly in recent time.    PHYSICAL EXAM: BP 112/61 mmHg  Pulse 76  Temp(Src) 97 F (36.1 C)  Ht '5\' 9"'$  (1.753 m)  Wt 193 lb 3.7 oz (87.65 kg)  BMI 28.52 kg/m2 Well-developed male in NAD. He has sequela of recent herpes zoster of the left face. He has prominent left supraclavicular adenopathy and left axillary adenopathy. Well-developed well-nourished patient in NAD. HEENT reveals PERLA, EOMI, discs not visualized.  Oral cavity is clear. No oral mucosal lesions are  identified. Neck is clear without evidence of cervical or supraclavicular adenopathy. Lungs are clear to A&P. Cardiac examination is essentially unremarkable with regular rate and rhythm without murmur rub or thrill. Abdomen is benign with no organomegaly or masses noted. Motor sensory and DTR levels are equal and symmetric in the upper and lower extremities. Cranial nerves II through XII are grossly intact. Proprioception is intact. No peripheral adenopathy or edema is identified. No motor or sensory levels are noted. Crude visual fields are within normal range.  LABORATORY DATA: Pathology report reviewed    RADIOLOGY RESULTS: CT scan of chest, PET CT scan, MRI of brain all reviewed   IMPRESSION: Extensive stage small cell lung cancer in patient previous treated for stage IIIB squamous cell carcinoma of left lung 8 years prior with significant left shoulder pain most likely secondary to bulky supraclavicular nodal involvement  PLAN: At this time I like to go ahead with palliative radiation therapy to his left supraclavicular region left axilla. Would go slowly treat 3000 cGy in 15 fractions since this area on my review has received radiation therapy treatments in the past. This area may received up to 7000 cGy using I MRT treatment planning and delivery. Risks and benefits of treatment including skin reaction fatigue possible minimal lymphedema of left upper extremity and possible brachioplexopathy secondary to recurrent treatment to this area  all were discussed with the patient. I feel feel we can go ahead with palliative treatment and a hypofractionated dose of palliative treatment achieve good pain relief without significant jeopardized and the brachial plexus. I personally set up and ordered CT simulation early next week. I've also discussed the case personally with medical oncology who agrees with my treatment recommendations.  I would like to take this opportunity for allowing me to participate in  the care of your patient.Armstead Peaks., MD

## 2016-02-13 ENCOUNTER — Ambulatory Visit
Admission: RE | Admit: 2016-02-13 | Discharge: 2016-02-13 | Disposition: A | Discharge status: Home / Self Care | Payer: Medicare Other | Source: Ambulatory Visit | Attending: Radiation Oncology | Admitting: Radiation Oncology

## 2016-02-13 DIAGNOSIS — C3412 Malignant neoplasm of upper lobe, left bronchus or lung: Secondary | ICD-10-CM | POA: Diagnosis not present

## 2016-02-13 DIAGNOSIS — Z510 Encounter for antineoplastic radiation therapy: Secondary | ICD-10-CM | POA: Diagnosis present

## 2016-02-13 DIAGNOSIS — F17210 Nicotine dependence, cigarettes, uncomplicated: Secondary | ICD-10-CM | POA: Diagnosis not present

## 2016-02-13 DIAGNOSIS — C773 Secondary and unspecified malignant neoplasm of axilla and upper limb lymph nodes: Secondary | ICD-10-CM | POA: Diagnosis not present

## 2016-02-13 NOTE — Patient Instructions (Signed)
Etoposide, VP-16 injection  What is this medicine?  ETOPOSIDE, VP-16 (e toe POE side) is a chemotherapy drug. It is used to treat testicular cancer, lung cancer, and other cancers.  This medicine may be used for other purposes; ask your health care provider or pharmacist if you have questions.  What should I tell my health care provider before I take this medicine?  They need to know if you have any of these conditions:  -infection  -kidney disease  -low blood counts, like low white cell, platelet, or red cell counts  -an unusual or allergic reaction to etoposide, other chemotherapeutic agents, other medicines, foods, dyes, or preservatives  -pregnant or trying to get pregnant  -breast-feeding  How should I use this medicine?  This medicine is for infusion into a vein. It is administered in a hospital or clinic by a specially trained health care professional.  Talk to your pediatrician regarding the use of this medicine in children. Special care may be needed.  Overdosage: If you think you have taken too much of this medicine contact a poison control center or emergency room at once.  NOTE: This medicine is only for you. Do not share this medicine with others.  What if I miss a dose?  It is important not to miss your dose. Call your doctor or health care professional if you are unable to keep an appointment.  What may interact with this medicine?  -aspirin  -certain medications for seizures like carbamazepine, phenobarbital, phenytoin, valproic acid  -cyclosporine  -levamisole  -warfarin  This list may not describe all possible interactions. Give your health care provider a list of all the medicines, herbs, non-prescription drugs, or dietary supplements you use. Also tell them if you smoke, drink alcohol, or use illegal drugs. Some items may interact with your medicine.  What should I watch for while using this medicine?  Visit your doctor for checks on your progress. This drug may make you feel generally unwell.  This is not uncommon, as chemotherapy can affect healthy cells as well as cancer cells. Report any side effects. Continue your course of treatment even though you feel ill unless your doctor tells you to stop.  In some cases, you may be given additional medicines to help with side effects. Follow all directions for their use.  Call your doctor or health care professional for advice if you get a fever, chills or sore throat, or other symptoms of a cold or flu. Do not treat yourself. This drug decreases your body's ability to fight infections. Try to avoid being around people who are sick.  This medicine may increase your risk to bruise or bleed. Call your doctor or health care professional if you notice any unusual bleeding.  Be careful brushing and flossing your teeth or using a toothpick because you may get an infection or bleed more easily. If you have any dental work done, tell your dentist you are receiving this medicine.  Avoid taking products that contain aspirin, acetaminophen, ibuprofen, naproxen, or ketoprofen unless instructed by your doctor. These medicines may hide a fever.  Do not become pregnant while taking this medicine or for at least 6 months after stopping it. Women should inform their doctor if they wish to become pregnant or think they might be pregnant. Women of child-bearing potential will need to have a negative pregnancy test before starting this medicine. There is a potential for serious side effects to an unborn child. Talk to your health care professional or   pharmacist for more information. Do not breast-feed an infant while taking this medicine.  Men must use a latex condom during sexual contact with a woman while taking this medicine and for at least 4 months after stopping it. A latex condom is needed even if you have had a vasectomy. Contact your doctor right away if your partner becomes pregnant. Do not donate sperm while taking this medicine and for at least 4 months after you stop  taking this medicine. Men should inform their doctors if they wish to father a child. This medicine may lower sperm counts.  What side effects may I notice from receiving this medicine?  Side effects that you should report to your doctor or health care professional as soon as possible:  -allergic reactions like skin rash, itching or hives, swelling of the face, lips, or tongue  -low blood counts - this medicine may decrease the number of white blood cells, red blood cells and platelets. You may be at increased risk for infections and bleeding.  -signs of infection - fever or chills, cough, sore throat, pain or difficulty passing urine  -signs of decreased platelets or bleeding - bruising, pinpoint red spots on the skin, black, tarry stools, blood in the urine  -signs of decreased red blood cells - unusually weak or tired, fainting spells, lightheadedness  -breathing problems  -changes in vision  -mouth or throat sores or ulcers  -pain, redness, swelling or irritation at the injection site  -pain, tingling, numbness in the hands or feet  -redness, blistering, peeling or loosening of the skin, including inside the mouth  -seizures  -vomiting  Side effects that usually do not require medical attention (report to your doctor or health care professional if they continue or are bothersome):  -diarrhea  -hair loss  -loss of appetite  -nausea  -stomach pain  This list may not describe all possible side effects. Call your doctor for medical advice about side effects. You may report side effects to FDA at 1-800-FDA-1088.  Where should I keep my medicine?  This drug is given in a hospital or clinic and will not be stored at home.  NOTE: This sheet is a summary. It may not cover all possible information. If you have questions about this medicine, talk to your doctor, pharmacist, or health care provider.      2016, Elsevier/Gold Standard. (2014-05-20 12:32:50)  Carboplatin injection  What is this medicine?  CARBOPLATIN (KAR boe  pla tin) is a chemotherapy drug. It targets fast dividing cells, like cancer cells, and causes these cells to die. This medicine is used to treat ovarian cancer and many other cancers.  This medicine may be used for other purposes; ask your health care provider or pharmacist if you have questions.  What should I tell my health care provider before I take this medicine?  They need to know if you have any of these conditions:  -blood disorders  -hearing problems  -kidney disease  -recent or ongoing radiation therapy  -an unusual or allergic reaction to carboplatin, cisplatin, other chemotherapy, other medicines, foods, dyes, or preservatives  -pregnant or trying to get pregnant  -breast-feeding  How should I use this medicine?  This drug is usually given as an infusion into a vein. It is administered in a hospital or clinic by a specially trained health care professional.  Talk to your pediatrician regarding the use of this medicine in children. Special care may be needed.  Overdosage: If you think you have taken   too much of this medicine contact a poison control center or emergency room at once.  NOTE: This medicine is only for you. Do not share this medicine with others.  What if I miss a dose?  It is important not to miss a dose. Call your doctor or health care professional if you are unable to keep an appointment.  What may interact with this medicine?  -medicines for seizures  -medicines to increase blood counts like filgrastim, pegfilgrastim, sargramostim  -some antibiotics like amikacin, gentamicin, neomycin, streptomycin, tobramycin  -vaccines  Talk to your doctor or health care professional before taking any of these medicines:  -acetaminophen  -aspirin  -ibuprofen  -ketoprofen  -naproxen  This list may not describe all possible interactions. Give your health care provider a list of all the medicines, herbs, non-prescription drugs, or dietary supplements you use. Also tell them if you smoke, drink alcohol, or  use illegal drugs. Some items may interact with your medicine.  What should I watch for while using this medicine?  Your condition will be monitored carefully while you are receiving this medicine. You will need important blood work done while you are taking this medicine.  This drug may make you feel generally unwell. This is not uncommon, as chemotherapy can affect healthy cells as well as cancer cells. Report any side effects. Continue your course of treatment even though you feel ill unless your doctor tells you to stop.  In some cases, you may be given additional medicines to help with side effects. Follow all directions for their use.  Call your doctor or health care professional for advice if you get a fever, chills or sore throat, or other symptoms of a cold or flu. Do not treat yourself. This drug decreases your body's ability to fight infections. Try to avoid being around people who are sick.  This medicine may increase your risk to bruise or bleed. Call your doctor or health care professional if you notice any unusual bleeding.  Be careful brushing and flossing your teeth or using a toothpick because you may get an infection or bleed more easily. If you have any dental work done, tell your dentist you are receiving this medicine.  Avoid taking products that contain aspirin, acetaminophen, ibuprofen, naproxen, or ketoprofen unless instructed by your doctor. These medicines may hide a fever.  Do not become pregnant while taking this medicine. Women should inform their doctor if they wish to become pregnant or think they might be pregnant. There is a potential for serious side effects to an unborn child. Talk to your health care professional or pharmacist for more information. Do not breast-feed an infant while taking this medicine.  What side effects may I notice from receiving this medicine?  Side effects that you should report to your doctor or health care professional as soon as possible:  -allergic  reactions like skin rash, itching or hives, swelling of the face, lips, or tongue  -signs of infection - fever or chills, cough, sore throat, pain or difficulty passing urine  -signs of decreased platelets or bleeding - bruising, pinpoint red spots on the skin, black, tarry stools, nosebleeds  -signs of decreased red blood cells - unusually weak or tired, fainting spells, lightheadedness  -breathing problems  -changes in hearing  -changes in vision  -chest pain  -high blood pressure  -low blood counts - This drug may decrease the number of white blood cells, red blood cells and platelets. You may be at increased risk for   infections and bleeding.  -nausea and vomiting  -pain, swelling, redness or irritation at the injection site  -pain, tingling, numbness in the hands or feet  -problems with balance, talking, walking  -trouble passing urine or change in the amount of urine  Side effects that usually do not require medical attention (report to your doctor or health care professional if they continue or are bothersome):  -hair loss  -loss of appetite  -metallic taste in the mouth or changes in taste  This list may not describe all possible side effects. Call your doctor for medical advice about side effects. You may report side effects to FDA at 1-800-FDA-1088.  Where should I keep my medicine?  This drug is given in a hospital or clinic and will not be stored at home.  NOTE: This sheet is a summary. It may not cover all possible information. If you have questions about this medicine, talk to your doctor, pharmacist, or health care provider.      2016, Elsevier/Gold Standard. (2007-12-30 14:38:05)

## 2016-02-14 ENCOUNTER — Other Ambulatory Visit: Payer: Self-pay | Admitting: Internal Medicine

## 2016-02-14 ENCOUNTER — Inpatient Hospital Stay: Payer: Medicare Other

## 2016-02-14 DIAGNOSIS — Z510 Encounter for antineoplastic radiation therapy: Secondary | ICD-10-CM | POA: Diagnosis not present

## 2016-02-15 ENCOUNTER — Inpatient Hospital Stay: Payer: Medicare Other

## 2016-02-15 ENCOUNTER — Inpatient Hospital Stay: Payer: Medicare Other | Admitting: Internal Medicine

## 2016-02-15 ENCOUNTER — Telehealth: Payer: Self-pay | Admitting: *Deleted

## 2016-02-15 NOTE — Telephone Encounter (Signed)
Asking when he is supposed to get his port inserted.

## 2016-02-15 NOTE — Telephone Encounter (Signed)
RN to f/u on vascular referral for port placement.

## 2016-02-15 NOTE — Telephone Encounter (Signed)
Avised that we are awaiting word from Vascular surgeons office. He asked that we call him when we know

## 2016-02-16 ENCOUNTER — Inpatient Hospital Stay: Payer: Medicare Other

## 2016-02-16 ENCOUNTER — Other Ambulatory Visit: Payer: Self-pay | Admitting: Vascular Surgery

## 2016-02-16 NOTE — Telephone Encounter (Signed)
Spoke with Dionne Milo at [facility].  Port a cath to be placed on 02/21/16.  Dionne Milo said that the patient is aware of this appointment.

## 2016-02-17 ENCOUNTER — Ambulatory Visit: Payer: Medicare Other

## 2016-02-20 ENCOUNTER — Ambulatory Visit
Admission: RE | Admit: 2016-02-20 | Discharge: 2016-02-20 | Disposition: A | Discharge status: Home / Self Care | Payer: Medicare Other | Source: Ambulatory Visit | Attending: Radiation Oncology | Admitting: Radiation Oncology

## 2016-02-20 DIAGNOSIS — Z510 Encounter for antineoplastic radiation therapy: Secondary | ICD-10-CM | POA: Diagnosis not present

## 2016-02-21 ENCOUNTER — Ambulatory Visit: Payer: Medicare Other

## 2016-02-21 ENCOUNTER — Ambulatory Visit
Admission: RE | Admit: 2016-02-21 | Discharge: 2016-02-21 | Disposition: A | Discharge status: Home / Self Care | Payer: Medicare Other | Source: Ambulatory Visit | Attending: Vascular Surgery | Admitting: Vascular Surgery

## 2016-02-21 ENCOUNTER — Encounter: Payer: Self-pay | Admitting: *Deleted

## 2016-02-21 ENCOUNTER — Encounter
Admission: RE | Disposition: A | Discharge status: Home / Self Care | Payer: Self-pay | Source: Ambulatory Visit | Attending: Vascular Surgery

## 2016-02-21 DIAGNOSIS — I252 Old myocardial infarction: Secondary | ICD-10-CM | POA: Diagnosis not present

## 2016-02-21 DIAGNOSIS — E119 Type 2 diabetes mellitus without complications: Secondary | ICD-10-CM | POA: Insufficient documentation

## 2016-02-21 DIAGNOSIS — Z7984 Long term (current) use of oral hypoglycemic drugs: Secondary | ICD-10-CM | POA: Diagnosis not present

## 2016-02-21 DIAGNOSIS — Z880 Allergy status to penicillin: Secondary | ICD-10-CM | POA: Diagnosis not present

## 2016-02-21 DIAGNOSIS — C3490 Malignant neoplasm of unspecified part of unspecified bronchus or lung: Secondary | ICD-10-CM | POA: Insufficient documentation

## 2016-02-21 DIAGNOSIS — Z800 Family history of malignant neoplasm of digestive organs: Secondary | ICD-10-CM | POA: Insufficient documentation

## 2016-02-21 DIAGNOSIS — I10 Essential (primary) hypertension: Secondary | ICD-10-CM | POA: Diagnosis not present

## 2016-02-21 DIAGNOSIS — F17210 Nicotine dependence, cigarettes, uncomplicated: Secondary | ICD-10-CM | POA: Insufficient documentation

## 2016-02-21 DIAGNOSIS — Z79899 Other long term (current) drug therapy: Secondary | ICD-10-CM | POA: Insufficient documentation

## 2016-02-21 DIAGNOSIS — J449 Chronic obstructive pulmonary disease, unspecified: Secondary | ICD-10-CM | POA: Insufficient documentation

## 2016-02-21 DIAGNOSIS — C3410 Malignant neoplasm of upper lobe, unspecified bronchus or lung: Secondary | ICD-10-CM

## 2016-02-21 DIAGNOSIS — J45909 Unspecified asthma, uncomplicated: Secondary | ICD-10-CM | POA: Insufficient documentation

## 2016-02-21 DIAGNOSIS — R079 Chest pain, unspecified: Secondary | ICD-10-CM | POA: Diagnosis not present

## 2016-02-21 HISTORY — PX: PERIPHERAL VASCULAR CATHETERIZATION: SHX172C

## 2016-02-21 SURGERY — PORTA CATH INSERTION
Anesthesia: Moderate Sedation

## 2016-02-21 MED ORDER — CLINDAMYCIN PHOSPHATE 300 MG/50ML IV SOLN
300.0000 mg | Freq: Once | INTRAVENOUS | Status: AC
  Administered 2016-02-21: 300 mg via INTRAVENOUS

## 2016-02-21 MED ORDER — LIDOCAINE-EPINEPHRINE (PF) 1 %-1:200000 IJ SOLN
INTRAMUSCULAR | Status: AC
  Filled 2016-02-21: qty 30

## 2016-02-21 MED ORDER — SODIUM CHLORIDE 0.9 % IR SOLN
Freq: Once | Status: DC
  Filled 2016-02-21: qty 2

## 2016-02-21 MED ORDER — FENTANYL CITRATE (PF) 100 MCG/2ML IJ SOLN
INTRAMUSCULAR | Status: AC
  Filled 2016-02-21: qty 2

## 2016-02-21 MED ORDER — LIDOCAINE-EPINEPHRINE (PF) 1 %-1:200000 IJ SOLN
INTRAMUSCULAR | Status: DC | PRN
  Administered 2016-02-21: 10 mL via INTRADERMAL

## 2016-02-21 MED ORDER — HEPARIN (PORCINE) IN NACL 2-0.9 UNIT/ML-% IJ SOLN
INTRAMUSCULAR | Status: AC
  Filled 2016-02-21: qty 500

## 2016-02-21 MED ORDER — MIDAZOLAM HCL 5 MG/5ML IJ SOLN
INTRAMUSCULAR | Status: AC
  Filled 2016-02-21: qty 5

## 2016-02-21 MED ORDER — MIDAZOLAM HCL 2 MG/2ML IJ SOLN
INTRAMUSCULAR | Status: DC | PRN
Start: 2016-02-21 — End: 2016-02-21
  Administered 2016-02-21: 1 mg via INTRAVENOUS
  Administered 2016-02-21: 2 mg via INTRAVENOUS

## 2016-02-21 MED ORDER — CLINDAMYCIN PHOSPHATE 300 MG/50ML IV SOLN
INTRAVENOUS | Status: AC
  Filled 2016-02-21: qty 50

## 2016-02-21 MED ORDER — ONDANSETRON HCL 4 MG/2ML IJ SOLN: 4.0000 mg | Freq: Four times a day (QID) | INTRAMUSCULAR | Status: DC | PRN

## 2016-02-21 MED ORDER — SODIUM CHLORIDE 0.9 % IV SOLN
INTRAVENOUS | Status: DC
  Administered 2016-02-21: 13:00:00 via INTRAVENOUS

## 2016-02-21 MED ORDER — FENTANYL CITRATE (PF) 100 MCG/2ML IJ SOLN
INTRAMUSCULAR | Status: DC | PRN
  Administered 2016-02-21 (×2): 50 ug via INTRAVENOUS

## 2016-02-21 MED ORDER — HYDROMORPHONE HCL 1 MG/ML IJ SOLN: 1.0000 mg | Freq: Once | INTRAMUSCULAR | Status: DC

## 2016-02-21 SURGICAL SUPPLY — 10 items
BAG DECANTER STRL (MISCELLANEOUS) ×3 IMPLANT
KIT PORT POWER 8FR ISP CVUE (Catheter) ×2 IMPLANT
PACK ANGIOGRAPHY (CUSTOM PROCEDURE TRAY) ×3 IMPLANT
PAD GROUND ADULT SPLIT (MISCELLANEOUS) ×3 IMPLANT
PENCIL ELECTRO HAND CTR (MISCELLANEOUS) ×3 IMPLANT
PREP CHG 10.5 TEAL (MISCELLANEOUS) ×3 IMPLANT
SUT MNCRL AB 4-0 PS2 18 (SUTURE) ×3 IMPLANT
SUT PROLENE 0 CT 1 30 (SUTURE) ×3 IMPLANT
SUTURE VIC 3-0 (SUTURE) ×3 IMPLANT
TOWEL OR 17X26 4PK STRL BLUE (TOWEL DISPOSABLE) ×3 IMPLANT

## 2016-02-21 NOTE — H&P (Signed)
  [facility] History & Physical Update  The patient was interviewed and re-examined.  The patient's previous History and Physical has been reviewed and is unchanged.  There is no change in the plan of care. We plan to proceed with the scheduled procedure.  DEW,JASON, MD  02/21/2016, 12:52 PM

## 2016-02-21 NOTE — Op Note (Signed)
      [facility]       Operative Note  Date: 02/21/2016  Preoperative diagnosis:  1. Lung cancer  Postoperative diagnosis:  Same as above  Procedures: #1. Ultrasound guidance for vascular access to the right internal jugular vein. #2. Fluoroscopic guidance for placement of catheter. #3. Placement of CT compatible Port-A-Cath, right internal jugular vein.  Surgeon: Leotis Pain, MD.   Anesthesia: Local with moderate conscious sedation for approximately 25  minutes using 3 mg of Versed and 50 mcg of Fentanyl  Fluoroscopy time: less than 1 minute  Contrast used: 0  Estimated blood loss: Minimal  Indication for the procedure:  The patient is a 75 y.o.male with lung cancer.  The patient needs a Port-A-Cath for durable venous access, chemotherapy, lab draws, and CT scans. We are asked to place this. Risks and benefits were discussed and informed consent was obtained.  Description of procedure: The patient was brought to the vascular and interventional radiology suite.  Moderate conscious sedation was administered throughout the procedure during a face to face encounter with the patient with my supervision of the RN administering medicines and monitoring the patient's vital signs, pulse oximetry, telemetry and mental status throughout from the start of the procedure until the patient was taken to the recovery room. The right neck chest and shoulder were sterilely prepped and draped, and a sterile surgical field was created. Ultrasound was used to help visualize a patent right internal jugular vein. This was then accessed under direct ultrasound guidance without difficulty with the Seldinger needle and a permanent image was recorded. A J-wire was placed. After skin nick and dilatation, the peel-away sheath was then placed over the wire. I then anesthetized an area under the clavicle approximately 1-2 fingerbreadths. A transverse incision was created and an inferior pocket was  created with electrocautery and blunt dissection. The port was then brought onto the field, placed into the pocket and secured to the chest wall with 2 Prolene sutures. The catheter was connected to the port and tunneled from the subclavicular incision to the access site. Fluoroscopic guidance was then used to cut the catheter to an appropriate length. The catheter was then placed through the peel-away sheath and the peel-away sheath was removed. The catheter tip was parked in excellent location under fluorocoscopic guidance in the mid superior vena cava. The pocket was then irrigated with antibiotic impregnated saline and the wound was closed with a running 3-0 Vicryl and a 4-0 Monocryl. The access incision was closed with a single 4-0 Monocryl. The Huber needle was used to withdraw blood and flush the port with heparinized saline. Dermabond was then placed as a dressing. The patient tolerated the procedure well and was taken to the recovery room in stable condition.   Makenly Larabee 02/21/2016 1:56 PM

## 2016-02-22 ENCOUNTER — Inpatient Hospital Stay: Payer: Medicare Other

## 2016-02-22 ENCOUNTER — Encounter: Payer: Self-pay | Admitting: Vascular Surgery

## 2016-02-22 ENCOUNTER — Ambulatory Visit
Admission: RE | Admit: 2016-02-22 | Discharge: 2016-02-22 | Disposition: A | Discharge status: Home / Self Care | Payer: Medicare Other | Source: Ambulatory Visit | Attending: Radiation Oncology | Admitting: Radiation Oncology

## 2016-02-22 ENCOUNTER — Inpatient Hospital Stay (HOSPITAL_BASED_OUTPATIENT_CLINIC_OR_DEPARTMENT_OTHER): Payer: Medicare Other | Admitting: Internal Medicine

## 2016-02-22 VITALS — BP 114/69 | HR 76 | Temp 99.4°F | Resp 22 | Wt 189.6 lb

## 2016-02-22 DIAGNOSIS — J449 Chronic obstructive pulmonary disease, unspecified: Secondary | ICD-10-CM

## 2016-02-22 DIAGNOSIS — G893 Neoplasm related pain (acute) (chronic): Secondary | ICD-10-CM

## 2016-02-22 DIAGNOSIS — Z7984 Long term (current) use of oral hypoglycemic drugs: Secondary | ICD-10-CM

## 2016-02-22 DIAGNOSIS — Z95828 Presence of other vascular implants and grafts: Secondary | ICD-10-CM

## 2016-02-22 DIAGNOSIS — C3492 Malignant neoplasm of unspecified part of left bronchus or lung: Secondary | ICD-10-CM

## 2016-02-22 DIAGNOSIS — R51 Headache: Secondary | ICD-10-CM

## 2016-02-22 DIAGNOSIS — C3412 Malignant neoplasm of upper lobe, left bronchus or lung: Secondary | ICD-10-CM | POA: Diagnosis not present

## 2016-02-22 DIAGNOSIS — R630 Anorexia: Secondary | ICD-10-CM | POA: Diagnosis not present

## 2016-02-22 DIAGNOSIS — Z5111 Encounter for antineoplastic chemotherapy: Secondary | ICD-10-CM | POA: Diagnosis not present

## 2016-02-22 DIAGNOSIS — F17210 Nicotine dependence, cigarettes, uncomplicated: Secondary | ICD-10-CM

## 2016-02-22 DIAGNOSIS — I252 Old myocardial infarction: Secondary | ICD-10-CM

## 2016-02-22 DIAGNOSIS — C773 Secondary and unspecified malignant neoplasm of axilla and upper limb lymph nodes: Secondary | ICD-10-CM

## 2016-02-22 DIAGNOSIS — Z800 Family history of malignant neoplasm of digestive organs: Secondary | ICD-10-CM

## 2016-02-22 DIAGNOSIS — I2510 Atherosclerotic heart disease of native coronary artery without angina pectoris: Secondary | ICD-10-CM

## 2016-02-22 DIAGNOSIS — Z923 Personal history of irradiation: Secondary | ICD-10-CM

## 2016-02-22 DIAGNOSIS — Z79899 Other long term (current) drug therapy: Secondary | ICD-10-CM

## 2016-02-22 DIAGNOSIS — I10 Essential (primary) hypertension: Secondary | ICD-10-CM

## 2016-02-22 DIAGNOSIS — J45909 Unspecified asthma, uncomplicated: Secondary | ICD-10-CM

## 2016-02-22 DIAGNOSIS — R918 Other nonspecific abnormal finding of lung field: Secondary | ICD-10-CM

## 2016-02-22 DIAGNOSIS — E119 Type 2 diabetes mellitus without complications: Secondary | ICD-10-CM

## 2016-02-22 DIAGNOSIS — Z510 Encounter for antineoplastic radiation therapy: Secondary | ICD-10-CM | POA: Diagnosis not present

## 2016-02-22 DIAGNOSIS — Z9221 Personal history of antineoplastic chemotherapy: Secondary | ICD-10-CM

## 2016-02-22 LAB — COMPREHENSIVE METABOLIC PANEL
ALT: 16 U/L — AB (ref 17–63)
AST: 25 U/L (ref 15–41)
Albumin: 4 g/dL (ref 3.5–5.0)
Alkaline Phosphatase: 42 U/L (ref 38–126)
Anion gap: 8 (ref 5–15)
BILIRUBIN TOTAL: 0.6 mg/dL (ref 0.3–1.2)
BUN: 14 mg/dL (ref 6–20)
CALCIUM: 8.9 mg/dL (ref 8.9–10.3)
CO2: 26 mmol/L (ref 22–32)
CREATININE: 0.92 mg/dL (ref 0.61–1.24)
Chloride: 98 mmol/L — ABNORMAL LOW (ref 101–111)
GFR calc Af Amer: >60 mL/min (ref >60)
Glucose, Bld: 221 mg/dL — ABNORMAL HIGH (ref 65–99)
Potassium: 4.1 mmol/L (ref 3.5–5.1)
Sodium: 132 mmol/L — ABNORMAL LOW (ref 135–145)
TOTAL PROTEIN: 7.8 g/dL (ref 6.5–8.1)

## 2016-02-22 LAB — CBC WITH DIFFERENTIAL/PLATELET
BASOS ABS: 0 10*3/uL (ref 0–0.1)
Basophils Relative: 1 %
Eosinophils Absolute: 0.2 10*3/uL (ref 0–0.7)
Eosinophils Relative: 2 %
HEMATOCRIT: 43 % (ref 40.0–52.0)
Hemoglobin: 14.7 g/dL (ref 13.0–18.0)
LYMPHS PCT: 25 %
Lymphs Abs: 1.8 10*3/uL (ref 1.0–3.6)
MCH: 28.2 pg (ref 26.0–34.0)
MCHC: 34.2 g/dL (ref 32.0–36.0)
MCV: 82.5 fL (ref 80.0–100.0)
MONO ABS: 0.5 10*3/uL (ref 0.2–1.0)
Monocytes Relative: 7 %
NEUTROS ABS: 4.8 10*3/uL (ref 1.4–6.5)
Neutrophils Relative %: 65 %
Platelets: 191 10*3/uL (ref 150–440)
RBC: 5.21 MIL/uL (ref 4.40–5.90)
RDW: 13.7 % (ref 11.5–14.5)
WBC: 7.3 10*3/uL (ref 3.8–10.6)

## 2016-02-22 LAB — LACTATE DEHYDROGENASE: LDH: 157 U/L (ref 98–192)

## 2016-02-22 MED ORDER — SODIUM CHLORIDE 0.9 % IV SOLN
527.5000 mg | Freq: Once | INTRAVENOUS | Status: AC
  Administered 2016-02-22: 530 mg via INTRAVENOUS
  Filled 2016-02-22: qty 53

## 2016-02-22 MED ORDER — SODIUM CHLORIDE 0.9 % IV SOLN
10.0000 mg | Freq: Once | INTRAVENOUS | Status: AC
  Administered 2016-02-22: 10 mg via INTRAVENOUS
  Filled 2016-02-22: qty 1

## 2016-02-22 MED ORDER — PALONOSETRON HCL INJECTION 0.25 MG/5ML
0.2500 mg | Freq: Once | INTRAVENOUS | Status: AC
  Administered 2016-02-22: 0.25 mg via INTRAVENOUS
  Filled 2016-02-22: qty 5

## 2016-02-22 MED ORDER — SODIUM CHLORIDE 0.9 % IV SOLN
Freq: Once | INTRAVENOUS | Status: AC
  Administered 2016-02-22: 12:00:00 via INTRAVENOUS
  Filled 2016-02-22: qty 1000

## 2016-02-22 MED ORDER — SODIUM CHLORIDE 0.9 % IV SOLN
100.0000 mg/m2 | Freq: Once | INTRAVENOUS | Status: AC
  Administered 2016-02-22: 210 mg via INTRAVENOUS
  Filled 2016-02-22: qty 10.5

## 2016-02-22 MED ORDER — SODIUM CHLORIDE 0.9 % IV SOLN: 100.0000 mg/m2 | Freq: Once | INTRAVENOUS | Status: DC

## 2016-02-22 MED ORDER — LIDOCAINE-PRILOCAINE 2.5-2.5 % EX CREA: 1.0000 "application " | TOPICAL_CREAM | CUTANEOUS | Status: DC | PRN

## 2016-02-22 MED ORDER — HEPARIN SOD (PORK) LOCK FLUSH 100 UNIT/ML IV SOLN
500.0000 [IU] | Freq: Once | INTRAVENOUS | Status: AC | PRN
  Administered 2016-02-22: 500 [IU]
  Filled 2016-02-22: qty 5

## 2016-02-22 NOTE — Patient Instructions (Signed)

## 2016-02-22 NOTE — Progress Notes (Signed)
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 RUL nodule- 12x10 mm-stable/benign; LEft supraclav LN/ right hilarLN/subcarinal LN/ Left axillary LN; MAY 17th 2017- START Carbo-Etop q3 W  # Left Chest pain- Pal RT [may 17th]  # [facility]- NEG/ Smoking  # SEP 2009- SQUAMOUS CELL CA LUL T2/T3 [Stage IIB] s/p Chemo-RT [decined Surgery]  INTERVAL HISTORY:  75year old male patient with extensive stage small cell lung cancer newly diagnosed is here to proceed with carboplatin etoposide chemotherapy.  Patient started radiation to his left chest wall/for palliative reasons today.  Patient states his pain is about 3 and a scale of 10. He has been needing to take tramadol as needed. He just had a tramadol refill. He is a poor appetite. Otherwise no significant weight loss.  Patient has chronic mild shortness of breath or chronic cough otherwise no hemoptysis. No hoarseness of was. No headaches.   REVIEW OF SYSTEMS:  A complete 10 point review of system is done which is negative except mentioned above/history of present illness.   PAST MEDICAL HISTORY :  Past Medical History  Diagnosis Date  . [place]ocardial infarct ([facility]   . COPD (chronic obstructive pulmonary disease) ([facility]   . Hypertension   . Diabetes mellitus without complication ([facility]   . Lung cancer, upper lobe ([facility] 2009  . Asthma     PAST SURGICAL HISTORY :   Past Surgical History  Procedure Laterality Date  . Throat surgery    . Tumor removal Right     "years ago-right lower posterior side"  . Peripheral vascular catheterization N/A 02/21/2016    Procedure: PGlori LuisCath Insertion;  Surgeon: JAlgernon Huxley MD;  [facility];  Service:  Cardiovascular;  Laterality: N/A;    FAMILY HISTORY :   Family History  Problem Relation Age of Onset  . Cancer - Colon Father   . Cancer - Colon Sister     SOCIAL HISTORY:   Social History  Substance Use Topics  . Smoking status: Current Every Day Smoker -- 1.00 packs/day for 62 years    Types: Cigarettes  . Smokeless tobacco: Not on file  . Alcohol Use: 3.6 oz/week    6 Cans of beer per week     Comment: rarely    ALLERGIES:  is allergic to clarithromycin and penicillins.  MEDICATIONS:  No current facility-administered medications for this visit.   Current Outpatient Prescriptions  Medication Sig Dispense Refill  . lidocaine-prilocaine (EMLA) cream Apply 1 application topically as needed. 30 g 6   Facility-Administered Medications Ordered in Other Visits  Medication Dose Route Frequency Provider Last Rate Last Dose  . 0.9 %  sodium chloride infusion   Intravenous Continuous Kimberly A Stegmayer, PA-C 50 mL/hr at 02/21/16 1250    . gentamicin (GARAMYCIN) 80 mg in sodium chloride irrigation 0.9 % 500 mL irrigation   Irrigation Once JAlgernon Huxley MD      . HYDROmorphone (DILAUDID) injection 1 mg  1 mg Intravenous Once K[employer] PA-C      . ondansetron (ZOFRAN) injection 4 mg  4 mg Intravenous Q6H PRN Kimberly A Stegmayer, PA-C        PHYSICAL EXAMINATION: ECOG PERFORMANCE STATUS: 1 - Symptomatic but  completely ambulatory  BP 114/69 mmHg  Pulse 76  Temp(Src) 99.4 F (37.4 C) (Tympanic)  Resp 22  Wt 189 lb 9.5 oz (86 kg)  SpO2 94%  Filed Weights   02/22/16 1038  Weight: 189 lb 9.5 oz (86 kg)    GENERAL: Well-nourished well-developed; Alert, no distress and comfortable.   Accompanied by wife /1daughters.  EYES: no pallor or icterus OROPHARYNX: no thrush or ulceration; poor dentition  NECK: supple, no masses felt LYMPH:  no palpable lymphadenopathy in the cervical, axillary or inguinal regions LUNGS: Decreased air entry bilaterally.  No wheeze or  crackles HEART/CVS: regular rate & rhythm and no murmurs; No lower extremity edema ABDOMEN:abdomen soft, non-tender and normal bowel sounds Musculoskeletal:no cyanosis of digits and no clubbing; tenderness noted in the left chest wall. No lumps or bumps noted. PSYCH: alert & oriented x 3 with fluent speech NEURO: no focal motor/sensory deficits SKIN:  no rashes or significant lesions  LABORATORY DATA:  I have reviewed the data as listed    Component Value Date/Time   NA 132* 02/22/2016 0922   K 4.1 02/22/2016 0922   CL 98* 02/22/2016 0922   CO2 26 02/22/2016 0922   GLUCOSE 221* 02/22/2016 0922   BUN 14 02/22/2016 0922   CREATININE 0.92 02/22/2016 0922   CREATININE 0.99 02/26/2014 1033   CALCIUM 8.9 02/22/2016 0922   CALCIUM 8.2* 02/26/2014 1033   PROT 7.8 02/22/2016 0922   PROT 7.3 02/26/2014 1033   ALBUMIN 4.0 02/22/2016 0922   ALBUMIN 3.4 02/26/2014 1033   AST 25 02/22/2016 0922   AST 17 02/26/2014 1033   ALT 16* 02/22/2016 0922   ALT 25 02/26/2014 1033   ALKPHOS 42 02/22/2016 0922   ALKPHOS 54 02/26/2014 1033   BILITOT 0.6 02/22/2016 0922   BILITOT 0.4 02/26/2014 1033   GFRNONAA >60 02/22/2016 0922   GFRNONAA >60 02/26/2014 1033   GFRAA >60 02/22/2016 0922   GFRAA >60 02/26/2014 1033    No results found for: SPEP, UPEP  Lab Results  Component Value Date   WBC 7.3 02/22/2016   NEUTROABS 4.8 02/22/2016   HGB 14.7 02/22/2016   HCT 43.0 02/22/2016   MCV 82.5 02/22/2016   PLT 191 02/22/2016      Chemistry      Component Value Date/Time   NA 132* 02/22/2016 0922   K 4.1 02/22/2016 0922   CL 98* 02/22/2016 0922   CO2 26 02/22/2016 0922   BUN 14 02/22/2016 0922   CREATININE 0.92 02/22/2016 0922   CREATININE 0.99 02/26/2014 1033      Component Value Date/Time   CALCIUM 8.9 02/22/2016 0922   CALCIUM 8.2* 02/26/2014 1033   ALKPHOS 42 02/22/2016 0922   ALKPHOS 54 02/26/2014 1033   AST 25 02/22/2016 0922   AST 17 02/26/2014 1033   ALT 16* 02/22/2016 0922    ALT 25 02/26/2014 1033   BILITOT 0.6 02/22/2016 0922   BILITOT 0.4 02/26/2014 1033      ASSESSMENT & PLAN:   # Small cell lung cancer- Extensive stage small cell lung cancer- proceed with first-line chemotherapy with carboplatin and etoposide today.  # CBC CMP within normal limits. I again discussed with the patient- that he will need 4-6 cycles of chemotherapy. Patient interested in clinical trial. I will again talk to the clinical trials nurse. We will plan to do a CT scan after 2-3 cycles.  # Left chest wall pain- sec to malignancy. Currently improved on tramadol. Patient also started radiation  today.   #  Follow-up with me in 10 days CBC BMP; follow-up with me in 3 weeks for cycle #2.   Cammie Sickle, MD 02/22/2016 11:02 AM

## 2016-02-22 NOTE — Progress Notes (Signed)
EMLA cream rx sent to patient's pharmacy today.

## 2016-02-22 NOTE — Progress Notes (Signed)
Patient ambulates without assistance, brought to exam room 5 accompanied by family.  Vitals documented, medication record updated, information provided by patient.

## 2016-02-23 ENCOUNTER — Ambulatory Visit
Admission: RE | Admit: 2016-02-23 | Discharge: 2016-02-23 | Disposition: A | Discharge status: Home / Self Care | Payer: Medicare Other | Source: Ambulatory Visit | Attending: Radiation Oncology | Admitting: Radiation Oncology

## 2016-02-23 ENCOUNTER — Inpatient Hospital Stay: Payer: Medicare Other

## 2016-02-23 VITALS — BP 108/70 | HR 75 | Temp 96.7°F | Resp 18

## 2016-02-23 DIAGNOSIS — Z5111 Encounter for antineoplastic chemotherapy: Secondary | ICD-10-CM | POA: Diagnosis not present

## 2016-02-23 DIAGNOSIS — Z510 Encounter for antineoplastic radiation therapy: Secondary | ICD-10-CM | POA: Diagnosis not present

## 2016-02-23 DIAGNOSIS — C3412 Malignant neoplasm of upper lobe, left bronchus or lung: Secondary | ICD-10-CM

## 2016-02-23 DIAGNOSIS — C3492 Malignant neoplasm of unspecified part of left bronchus or lung: Secondary | ICD-10-CM

## 2016-02-23 MED ORDER — SODIUM CHLORIDE 0.9 % IV SOLN
Freq: Once | INTRAVENOUS | Status: AC
  Administered 2016-02-23: 10:00:00 via INTRAVENOUS
  Filled 2016-02-23: qty 1000

## 2016-02-23 MED ORDER — HEPARIN SOD (PORK) LOCK FLUSH 100 UNIT/ML IV SOLN
500.0000 [IU] | Freq: Once | INTRAVENOUS | Status: AC | PRN
  Administered 2016-02-23: 500 [IU]
  Filled 2016-02-23: qty 5

## 2016-02-23 MED ORDER — SODIUM CHLORIDE 0.9% FLUSH
10.0000 mL | INTRAVENOUS | Status: DC | PRN
  Administered 2016-02-23: 10 mL
  Filled 2016-02-23: qty 10

## 2016-02-23 MED ORDER — DEXAMETHASONE SODIUM PHOSPHATE 100 MG/10ML IJ SOLN
10.0000 mg | Freq: Once | INTRAMUSCULAR | Status: AC
  Administered 2016-02-23: 10 mg via INTRAVENOUS
  Filled 2016-02-23: qty 1

## 2016-02-23 MED ORDER — SODIUM CHLORIDE 0.9 % IV SOLN: 100.0000 mg/m2 | Freq: Once | INTRAVENOUS | Status: DC

## 2016-02-23 MED ORDER — SODIUM CHLORIDE 0.9 % IV SOLN
100.0000 mg/m2 | Freq: Once | INTRAVENOUS | Status: AC
  Administered 2016-02-23: 210 mg via INTRAVENOUS
  Filled 2016-02-23: qty 10.5

## 2016-02-23 NOTE — H&P (Signed)
[facility] Admission History & Physical  MRN : [id number]  Richard Jimenez is a 75 y.o. ([birth year]) male who presents with chief complaint of No chief complaint on file. Marland Kitchen  History of Present Illness:  Patient is a 75 year old male sent from his oncologist for a Port-A-Cath. I have asked to see the patient to place a Port-A-Cath for durable venous access as he is scheduled to start his chemotherapy later this week. He has chest pain and shortness of breath and is digital to start chemotherapy soon. He has chronic COPD and shortness of breath prior to his lung cancer diagnosis. He has no fevers or chills or signs systemic infection.  No current facility-administered medications for this encounter.   Current Outpatient Prescriptions  Medication Sig Dispense Refill  . albuterol (PROAIR HFA) 108 (90 Base) MCG/ACT inhaler Inhale 2 puffs into the lungs every 6 (six) hours as needed.    Marland Kitchen albuterol (PROVENTIL) (2.5 MG/3ML) 0.083% nebulizer solution Inhale 3 mLs into the lungs every 12 (twelve) hours as needed.    Marland Kitchen glucose blood test strip     . lidocaine-prilocaine (EMLA) cream Apply 1 application topically as needed. 30 g 6  . metFORMIN (GLUCOPHAGE) 1000 MG tablet Take 1,000 mg by mouth 2 (two) times daily. Reported on 02/22/2016    . ondansetron (ZOFRAN) 8 MG tablet Take 1 tablet (8 mg total) by mouth every 8 (eight) hours as needed for nausea or vomiting (start 3 days; after chemo). (Patient not taking: Reported on 02/22/2016) 40 tablet 0  . prochlorperazine (COMPAZINE) 10 MG tablet Take 1 tablet (10 mg total) by mouth every 6 (six) hours as needed for nausea or vomiting. (Patient not taking: Reported on 02/22/2016) 30 tablet 0  . Shark Cartilage 500 MG CAPS Take by mouth.    . traMADol (ULTRAM) 50 MG tablet Take 50 mg by mouth every 6 (six) hours as needed. for pain  3    Past Medical History  Diagnosis Date  . Myocardial infarct ([place])   . COPD (chronic obstructive  pulmonary disease) ([place])   . Hypertension   . Diabetes mellitus without complication ([place])   . Lung cancer, upper lobe ([place]) 2009  . Asthma     Past Surgical History  Procedure Laterality Date  . Throat surgery    . Tumor removal Right     "years ago-right lower posterior side"  . Peripheral vascular catheterization N/A 02/21/2016    Procedure: Glori Luis Cath Insertion;  Surgeon: Algernon Huxley, MD;  Location: [facility];  Service: Cardiovascular;  Laterality: N/A;    Social History Social History  Substance Use Topics  . Smoking status: Current Every Day Smoker -- 1.00 packs/day for 62 years    Types: Cigarettes  . Smokeless tobacco: None  . Alcohol Use: 3.6 oz/week    6 Cans of beer per week     Comment: rarely    Family History Family History  Problem Relation Age of Onset  . Cancer - Colon Father   . Cancer - Colon Sister   No bleeding disorders or clotting disorders.  Allergies  Allergen Reactions  . Clarithromycin Nausea And Vomiting  . Penicillins Nausea Only     REVIEW OF SYSTEMS (Negative unless checked)  Constitutional: '[]'$ Weight loss  '[]'$ Fever  '[]'$ Chills Cardiac: '[x]'$ Chest pain   '[]'$ Chest pressure   '[]'$ Palpitations   '[]'$ Shortness of breath when laying flat   '[]'$ Shortness of breath at rest   '[x]'$ Shortness of breath with  exertion. Vascular:  '[]'$ Pain in legs with walking   '[]'$ Pain in legs at rest   '[]'$ Pain in legs when laying flat   '[]'$ Claudication   '[]'$ Pain in feet when walking  '[]'$ Pain in feet at rest  '[]'$ Pain in feet when laying flat   '[]'$ History of DVT   '[]'$ Phlebitis   '[]'$ Swelling in legs   '[]'$ Varicose veins   '[]'$ Non-healing ulcers Pulmonary:   '[]'$ Uses home oxygen   '[x]'$ Productive cough   '[]'$ Hemoptysis   '[]'$ Wheeze  '[x]'$ COPD   '[x]'$ Asthma Neurologic:  '[]'$ Dizziness  '[]'$ Blackouts   '[]'$ Seizures   '[]'$ History of stroke   '[]'$ History of TIA  '[]'$ Aphasia   '[]'$ Temporary blindness   '[]'$ Dysphagia   '[]'$ Weakness or numbness in arms   '[]'$ Weakness or numbness in legs Musculoskeletal:  '[]'$ Arthritis   '[]'$ Joint  swelling   '[]'$ Joint pain   '[]'$ Low back pain Hematologic:  '[]'$ Easy bruising  '[]'$ Easy bleeding   '[]'$ Hypercoagulable state   '[]'$ Anemic  '[]'$ Hepatitis Gastrointestinal:  '[]'$ Blood in stool   '[]'$ Vomiting blood  '[]'$ Gastroesophageal reflux/heartburn   '[]'$ Difficulty swallowing. Genitourinary:  '[]'$ Chronic kidney disease   '[]'$ Difficult urination  '[]'$ Frequent urination  '[]'$ Burning with urination   '[]'$ Blood in urine Skin:  '[]'$ Rashes   '[]'$ Ulcers   '[]'$ Wounds Psychological:  '[]'$ History of anxiety   '[]'$  History of major depression.  Physical Examination  Filed Vitals:   02/21/16 1231 02/21/16 1400 02/21/16 1415  BP:  137/73 158/75  Pulse: 78 78 85  Temp: 98.4 F (36.9 C)    TempSrc: Oral    Resp: '22 17 25  '$ Height: '5\' 9"'$  (1.753 m)    Weight: 87.544 kg (193 lb)    SpO2: 95% 95% 97%   Body mass index is 28.49 kg/(m^2). Gen: WD/WN, NAD Head: [place]/AT, No temporalis wasting. Prominent temp pulse not noted. Ear/Nose/Throat: Hearing grossly intact, nares w/o erythema or drainage, oropharynx w/o Erythema/Exudate,  Eyes: PERRLA, EOMI.  Neck: Supple, no nuchal rigidity.  No JVD.  Pulmonary:  Good air movement, no use of accessory muscles.  Cardiac: RRR, normal S1, S2 Vascular:  Vessel Right Left  Radial Palpable Palpable                                   Gastrointestinal: soft, non-tender/non-distended. No guarding/reflex.  Musculoskeletal: M/S 5/5 throughout.  Extremities without ischemic changes.  No deformity or atrophy.  Neurologic: CN 2-12 intact. Pain and light touch intact in extremities.  Symmetrical.  Speech is fluent. Motor exam as listed above. Psychiatric: Judgment intact, Mood & affect appropriate for pt's clinical situation. Dermatologic: No rashes or ulcers noted.  No cellulitis or open wounds. Lymph : No Cervical, Axillary, or Inguinal lymphadenopathy.    CBC Lab Results  Component Value Date   WBC 7.3 02/22/2016   HGB 14.7 02/22/2016   HCT 43.0 02/22/2016   MCV 82.5 02/22/2016   PLT 191  02/22/2016    BMET    Component Value Date/Time   NA 132* 02/22/2016 0922   K 4.1 02/22/2016 0922   CL 98* 02/22/2016 0922   CO2 26 02/22/2016 0922   GLUCOSE 221* 02/22/2016 0922   BUN 14 02/22/2016 0922   CREATININE 0.92 02/22/2016 0922   CREATININE 0.99 02/26/2014 1033   CALCIUM 8.9 02/22/2016 0922   CALCIUM 8.2* 02/26/2014 1033   GFRNONAA >60 02/22/2016 0922   GFRNONAA >60 02/26/2014 1033   GFRAA >60 02/22/2016 0922   GFRAA >60 02/26/2014 1033   Estimated Creatinine Clearance: 77.1 mL/min (  by C-G formula based on Cr of 0.92).  COAG Lab Results  Component Value Date   INR 0.96 01/13/2016    Radiology No results found.    Assessment/Plan 1. Lung cancer. Needs Port-A-Cath for durable venous access for chemotherapy. Risks and benefits were discussed and he is agreeable to proceed. 2. COPD. Chronic shortness of breath with ongoing tobacco use. 3. Diabetes. Stable outpatient medications. 4. Hypertension. Stable outpatient medications.   Jajuan Skoog, MD  02/23/2016 8:55 AM

## 2016-02-24 ENCOUNTER — Ambulatory Visit
Admission: RE | Admit: 2016-02-24 | Discharge: 2016-02-24 | Disposition: A | Discharge status: Home / Self Care | Payer: Medicare Other | Source: Ambulatory Visit | Attending: Radiation Oncology | Admitting: Radiation Oncology

## 2016-02-24 ENCOUNTER — Inpatient Hospital Stay: Payer: Medicare Other

## 2016-02-24 DIAGNOSIS — C3412 Malignant neoplasm of upper lobe, left bronchus or lung: Secondary | ICD-10-CM

## 2016-02-24 DIAGNOSIS — Z5111 Encounter for antineoplastic chemotherapy: Secondary | ICD-10-CM | POA: Diagnosis not present

## 2016-02-24 DIAGNOSIS — C3492 Malignant neoplasm of unspecified part of left bronchus or lung: Secondary | ICD-10-CM

## 2016-02-24 DIAGNOSIS — Z510 Encounter for antineoplastic radiation therapy: Secondary | ICD-10-CM | POA: Diagnosis not present

## 2016-02-24 MED ORDER — HEPARIN SOD (PORK) LOCK FLUSH 100 UNIT/ML IV SOLN
500.0000 [IU] | Freq: Once | INTRAVENOUS | Status: AC
  Administered 2016-02-24: 500 [IU] via INTRAVENOUS
  Filled 2016-02-24: qty 5

## 2016-02-24 MED ORDER — SODIUM CHLORIDE 0.9 % IV SOLN
10.0000 mg | Freq: Once | INTRAVENOUS | Status: AC
  Administered 2016-02-24: 10 mg via INTRAVENOUS
  Filled 2016-02-24: qty 1

## 2016-02-24 MED ORDER — SODIUM CHLORIDE 0.9% FLUSH
10.0000 mL | INTRAVENOUS | Status: DC | PRN
  Administered 2016-02-24: 10 mL via INTRAVENOUS
  Filled 2016-02-24: qty 10

## 2016-02-24 MED ORDER — SODIUM CHLORIDE 0.9 % IV SOLN
Freq: Once | INTRAVENOUS | Status: AC
  Administered 2016-02-24: 11:00:00 via INTRAVENOUS
  Filled 2016-02-24: qty 1000

## 2016-02-24 MED ORDER — PEGFILGRASTIM 6 MG/0.6ML ~~LOC~~ PSKT
6.0000 mg | PREFILLED_SYRINGE | Freq: Once | SUBCUTANEOUS | Status: AC
  Administered 2016-02-24: 6 mg via SUBCUTANEOUS
  Filled 2016-02-24: qty 0.6

## 2016-02-24 MED ORDER — SODIUM CHLORIDE 0.9 % IV SOLN
100.0000 mg/m2 | Freq: Once | INTRAVENOUS | Status: AC
  Administered 2016-02-24: 210 mg via INTRAVENOUS
  Filled 2016-02-24: qty 10.5

## 2016-02-27 ENCOUNTER — Ambulatory Visit
Admission: RE | Admit: 2016-02-27 | Discharge: 2016-02-27 | Disposition: A | Discharge status: Home / Self Care | Payer: Medicare Other | Source: Ambulatory Visit | Attending: Radiation Oncology | Admitting: Radiation Oncology

## 2016-02-27 DIAGNOSIS — Z510 Encounter for antineoplastic radiation therapy: Secondary | ICD-10-CM | POA: Diagnosis not present

## 2016-02-28 ENCOUNTER — Ambulatory Visit
Admission: RE | Admit: 2016-02-28 | Discharge: 2016-02-28 | Disposition: A | Discharge status: Home / Self Care | Payer: Medicare Other | Source: Ambulatory Visit | Attending: Radiation Oncology | Admitting: Radiation Oncology

## 2016-02-28 DIAGNOSIS — Z510 Encounter for antineoplastic radiation therapy: Secondary | ICD-10-CM | POA: Diagnosis not present

## 2016-02-29 ENCOUNTER — Ambulatory Visit
Admission: RE | Admit: 2016-02-29 | Discharge: 2016-02-29 | Disposition: A | Discharge status: Home / Self Care | Payer: Medicare Other | Source: Ambulatory Visit | Attending: Radiation Oncology | Admitting: Radiation Oncology

## 2016-02-29 DIAGNOSIS — Z510 Encounter for antineoplastic radiation therapy: Secondary | ICD-10-CM | POA: Diagnosis not present

## 2016-03-01 ENCOUNTER — Ambulatory Visit
Admission: RE | Admit: 2016-03-01 | Discharge: 2016-03-01 | Disposition: A | Discharge status: Home / Self Care | Payer: Medicare Other | Source: Ambulatory Visit | Attending: Radiation Oncology | Admitting: Radiation Oncology

## 2016-03-01 DIAGNOSIS — Z510 Encounter for antineoplastic radiation therapy: Secondary | ICD-10-CM | POA: Diagnosis not present

## 2016-03-02 ENCOUNTER — Inpatient Hospital Stay (HOSPITAL_BASED_OUTPATIENT_CLINIC_OR_DEPARTMENT_OTHER): Payer: Medicare Other | Admitting: Internal Medicine

## 2016-03-02 ENCOUNTER — Ambulatory Visit
Admission: RE | Admit: 2016-03-02 | Discharge: 2016-03-02 | Disposition: A | Discharge status: Home / Self Care | Payer: Medicare Other | Source: Ambulatory Visit | Attending: Radiation Oncology | Admitting: Radiation Oncology

## 2016-03-02 ENCOUNTER — Inpatient Hospital Stay: Payer: Medicare Other

## 2016-03-02 VITALS — BP 115/71 | HR 80 | Temp 97.6°F | Resp 18 | Wt 189.6 lb

## 2016-03-02 DIAGNOSIS — C773 Secondary and unspecified malignant neoplasm of axilla and upper limb lymph nodes: Secondary | ICD-10-CM

## 2016-03-02 DIAGNOSIS — C3412 Malignant neoplasm of upper lobe, left bronchus or lung: Secondary | ICD-10-CM | POA: Diagnosis not present

## 2016-03-02 DIAGNOSIS — Z5111 Encounter for antineoplastic chemotherapy: Secondary | ICD-10-CM | POA: Diagnosis not present

## 2016-03-02 DIAGNOSIS — C3492 Malignant neoplasm of unspecified part of left bronchus or lung: Secondary | ICD-10-CM

## 2016-03-02 DIAGNOSIS — J449 Chronic obstructive pulmonary disease, unspecified: Secondary | ICD-10-CM

## 2016-03-02 DIAGNOSIS — R51 Headache: Secondary | ICD-10-CM

## 2016-03-02 DIAGNOSIS — Z923 Personal history of irradiation: Secondary | ICD-10-CM

## 2016-03-02 DIAGNOSIS — Z800 Family history of malignant neoplasm of digestive organs: Secondary | ICD-10-CM

## 2016-03-02 DIAGNOSIS — R918 Other nonspecific abnormal finding of lung field: Secondary | ICD-10-CM

## 2016-03-02 DIAGNOSIS — E119 Type 2 diabetes mellitus without complications: Secondary | ICD-10-CM

## 2016-03-02 DIAGNOSIS — I10 Essential (primary) hypertension: Secondary | ICD-10-CM

## 2016-03-02 DIAGNOSIS — Z510 Encounter for antineoplastic radiation therapy: Secondary | ICD-10-CM | POA: Diagnosis not present

## 2016-03-02 DIAGNOSIS — Z7984 Long term (current) use of oral hypoglycemic drugs: Secondary | ICD-10-CM

## 2016-03-02 DIAGNOSIS — Z79899 Other long term (current) drug therapy: Secondary | ICD-10-CM

## 2016-03-02 DIAGNOSIS — G893 Neoplasm related pain (acute) (chronic): Secondary | ICD-10-CM

## 2016-03-02 DIAGNOSIS — I2510 Atherosclerotic heart disease of native coronary artery without angina pectoris: Secondary | ICD-10-CM

## 2016-03-02 DIAGNOSIS — F17210 Nicotine dependence, cigarettes, uncomplicated: Secondary | ICD-10-CM

## 2016-03-02 DIAGNOSIS — R630 Anorexia: Secondary | ICD-10-CM

## 2016-03-02 DIAGNOSIS — Z9221 Personal history of antineoplastic chemotherapy: Secondary | ICD-10-CM

## 2016-03-02 DIAGNOSIS — J45909 Unspecified asthma, uncomplicated: Secondary | ICD-10-CM

## 2016-03-02 DIAGNOSIS — I252 Old myocardial infarction: Secondary | ICD-10-CM

## 2016-03-02 LAB — CBC WITH DIFFERENTIAL/PLATELET
BASOS ABS: 0 10*3/uL (ref 0–0.1)
BASOS PCT: 0 %
Eosinophils Absolute: 0.1 10*3/uL (ref 0–0.7)
Eosinophils Relative: 3 %
HEMATOCRIT: 41.3 % (ref 40.0–52.0)
HEMOGLOBIN: 13.8 g/dL (ref 13.0–18.0)
LYMPHS PCT: 23 %
Lymphs Abs: 1.1 10*3/uL (ref 1.0–3.6)
MCH: 28 pg (ref 26.0–34.0)
MCHC: 33.5 g/dL (ref 32.0–36.0)
MCV: 83.5 fL (ref 80.0–100.0)
MONO ABS: 0.3 10*3/uL (ref 0.2–1.0)
Monocytes Relative: 7 %
NEUTROS ABS: 3.3 10*3/uL (ref 1.4–6.5)
NEUTROS PCT: 67 %
Platelets: 89 10*3/uL — ABNORMAL LOW (ref 150–440)
RBC: 4.95 MIL/uL (ref 4.40–5.90)
RDW: 13.4 % (ref 11.5–14.5)
WBC: 4.9 10*3/uL (ref 3.8–10.6)

## 2016-03-02 LAB — BASIC METABOLIC PANEL
ANION GAP: 5 (ref 5–15)
BUN: 13 mg/dL (ref 6–20)
CHLORIDE: 98 mmol/L — AB (ref 101–111)
CO2: 30 mmol/L (ref 22–32)
Calcium: 8.9 mg/dL (ref 8.9–10.3)
Creatinine, Ser: 0.85 mg/dL (ref 0.61–1.24)
GFR calc non Af Amer: >60 mL/min (ref >60)
GLUCOSE: 175 mg/dL — AB (ref 65–99)
Potassium: 4.2 mmol/L (ref 3.5–5.1)
Sodium: 133 mmol/L — ABNORMAL LOW (ref 135–145)

## 2016-03-02 NOTE — Progress Notes (Signed)
[facility] OFFICE PROGRESS NOTE  Patient Care Team: Lottie Mussel III, MD as PCP - General (Internal Medicine)   SUMMARY OF ONCOLOGIC HISTORY:  # April-MAY 2017 SMALL CELL LUNG CANCER EXTENSIVE STAGE  LUL #1 4.1x 3.5cm;#2- 3.3x3.0 #3 Left pleural based nodule 43m [new]; Left apical radiation changes. RUL nodule- 12x10 mm-stable/benign; LEft supraclav LN/ right hilarLN/subcarinal LN/ Left axillary LN; MAY 17th 2017- START Carbo-Etop q3 W  # Left Chest pain- Pal RT [may 17th]  # [facility]- NEG/ Smoking  # SEP 2009- SQUAMOUS CELL CA LUL T2/T3 [Stage IIB] s/p Chemo-RT [decined Surgery]  INTERVAL HISTORY:  75year old male patient with extensive stage small cell lung cancer currently on first line therapy with carboplatin etoposide chemotherapy- status post cycle #1 approximately 10 days ago.  Patient's appetite is good. He notes his left chest wall pain significantly improved. He has not been needing to take any pain medications.   He had mild nausea without vomiting. Appetite good. No weight loss. Patient has chronic mild shortness of breath or chronic cough. No hoarseness of voice.  In last few days noted to have increasing blood while coughing. Otherwise no profuse hemoptysis. Patient is not on aspirin.   REVIEW OF SYSTEMS:  A complete 10 point review of system is done which is negative except mentioned above/history of present illness.   PAST MEDICAL HISTORY :  Past Medical History  Diagnosis Date  . Myocardial infarct ([place]   . COPD (chronic obstructive pulmonary disease) ([place]   . Hypertension   . Diabetes mellitus without complication ([place]   . Lung cancer, [place] lobe ([place] 2009  . Asthma     PAST SURGICAL HISTORY :   Past Surgical History  Procedure Laterality Date  . Throat surgery    . Tumor removal Right     "years ago-right lower posterior side"  . Peripheral vascular catheterization N/A 02/21/2016    Procedure: PGlori LuisCath Insertion;  Surgeon:  JAlgernon Huxley MD;  [facility];  Service: Cardiovascular;  Laterality: N/A;    FAMILY HISTORY :   Family History  Problem Relation Age of Onset  . Cancer - Colon Father   . Cancer - Colon Sister     SOCIAL HISTORY:   Social History  Substance Use Topics  . Smoking status: Current Every Day Smoker -- 1.00 packs/day for 62 years    Types: Cigarettes  . Smokeless tobacco: Not on file  . Alcohol Use: 3.6 oz/week    6 Cans of beer per week     Comment: rarely    ALLERGIES:  is allergic to clarithromycin and penicillins.  MEDICATIONS:  Current Outpatient Prescriptions  Medication Sig Dispense Refill  . albuterol (PROAIR HFA) 108 (90 Base) MCG/ACT inhaler Inhale 2 puffs into the lungs every 6 (six) hours as needed.    .Marland Kitchenalbuterol (PROVENTIL) (2.5 MG/3ML) 0.083% nebulizer solution Inhale 3 mLs into the lungs every 12 (twelve) hours as needed.    .Marland Kitchenglucose blood test strip     . lidocaine-prilocaine (EMLA) cream Apply 1 application topically as needed. 30 g 6  . metFORMIN (GLUCOPHAGE) 1000 MG tablet Take 1,000 mg by mouth 2 (two) times daily. Reported on 02/22/2016    . ondansetron (ZOFRAN) 8 MG tablet Take 1 tablet (8 mg total) by mouth every 8 (eight) hours as needed for nausea or vomiting (start 3 days; after chemo). 40 tablet 0  . prochlorperazine (COMPAZINE) 10 MG tablet Take 1 tablet (10  mg total) by mouth every 6 (six) hours as needed for nausea or vomiting. 30 tablet 0  . Shark Cartilage 500 MG CAPS Take by mouth.    . traMADol (ULTRAM) 50 MG tablet Take 50 mg by mouth every 6 (six) hours as needed. for pain  3   No current facility-administered medications for this visit.    PHYSICAL EXAMINATION: ECOG PERFORMANCE STATUS: 1 - Symptomatic but completely ambulatory  BP 115/71 mmHg  Pulse 80  Temp(Src) 97.6 F (36.4 C) (Tympanic)  Resp 18  Wt 189 lb 9.5 oz (86 kg)  Filed Weights   03/02/16 1051  Weight: 189 lb 9.5 oz (86 kg)    GENERAL:  Well-nourished well-developed; Alert, no distress and comfortable.   Accompanied by wife /1daughter  EYES: no pallor or icterus OROPHARYNX: no thrush or ulceration; poor dentition  NECK: supple, no masses felt LYMPH:  no palpable lymphadenopathy in the cervical, axillary or inguinal regions LUNGS: Decreased air entry bilaterally.  No wheeze or crackles HEART/CVS: regular rate & rhythm and no murmurs; No lower extremity edema ABDOMEN:abdomen soft, non-tender and normal bowel sounds Musculoskeletal:no cyanosis of digits and no clubbing; tenderness noted in the left chest wall. No lumps or bumps noted. PSYCH: alert & oriented x 3 with fluent speech NEURO: no focal motor/sensory deficits SKIN:  no rashes or significant lesions  LABORATORY DATA:  I have reviewed the data as listed    Component Value Date/Time   NA 133* 03/02/2016 0950   K 4.2 03/02/2016 0950   CL 98* 03/02/2016 0950   CO2 30 03/02/2016 0950   GLUCOSE 175* 03/02/2016 0950   BUN 13 03/02/2016 0950   CREATININE 0.85 03/02/2016 0950   CREATININE 0.99 02/26/2014 1033   CALCIUM 8.9 03/02/2016 0950   CALCIUM 8.2* 02/26/2014 1033   PROT 7.8 02/22/2016 0922   PROT 7.3 02/26/2014 1033   ALBUMIN 4.0 02/22/2016 0922   ALBUMIN 3.4 02/26/2014 1033   AST 25 02/22/2016 0922   AST 17 02/26/2014 1033   ALT 16* 02/22/2016 0922   ALT 25 02/26/2014 1033   ALKPHOS 42 02/22/2016 0922   ALKPHOS 54 02/26/2014 1033   BILITOT 0.6 02/22/2016 0922   BILITOT 0.4 02/26/2014 1033   GFRNONAA >60 03/02/2016 0950   GFRNONAA >60 02/26/2014 1033   GFRAA >60 03/02/2016 0950   GFRAA >60 02/26/2014 1033    No results found for: SPEP, UPEP  Lab Results  Component Value Date   WBC 4.9 03/02/2016   NEUTROABS 3.3 03/02/2016   HGB 13.8 03/02/2016   HCT 41.3 03/02/2016   MCV 83.5 03/02/2016   PLT 89* 03/02/2016      Chemistry      Component Value Date/Time   NA 133* 03/02/2016 0950   K 4.2 03/02/2016 0950   CL 98* 03/02/2016 0950   CO2  30 03/02/2016 0950   BUN 13 03/02/2016 0950   CREATININE 0.85 03/02/2016 0950   CREATININE 0.99 02/26/2014 1033      Component Value Date/Time   CALCIUM 8.9 03/02/2016 0950   CALCIUM 8.2* 02/26/2014 1033   ALKPHOS 42 02/22/2016 0922   ALKPHOS 54 02/26/2014 1033   AST 25 02/22/2016 0922   AST 17 02/26/2014 1033   ALT 16* 02/22/2016 0922   ALT 25 02/26/2014 1033   BILITOT 0.6 02/22/2016 0922   BILITOT 0.4 02/26/2014 1033      ASSESSMENT & PLAN:   # Small cell lung cancer- Extensive stage small cell lung cancer-Status post cycle #1  of carbo etoposide approximately 10 days ago. Also on palliative radiation left chest wall.  # Patient seems to be tolerating chemotherapy well; as well as clinical response with improvement of the pain. Proceed with cycle #2 the next 10 days. CBC unremarkable except for platelet count of 89. CMP within normal limits.  # Mild hemoptysis- likely from the underlying malignancy; this is not profuse. Monitor for now. Discussed with Dr. Donella Stade.  # Left chest wall pain- sec to malignancy. Significant improvement noted. Patient not taking any pain medications.  #  Follow-up with me in 10 days CBC; CMP- cycle #2.  # 25 minutes face-to-face with the patient discussing the above plan of care; more than 50% of time spent on prognosis/ natural history; counseling and coordination.    Cammie Sickle, MD 03/02/2016 11:08 AM

## 2016-03-06 ENCOUNTER — Ambulatory Visit
Admission: RE | Admit: 2016-03-06 | Discharge: 2016-03-06 | Disposition: A | Discharge status: Home / Self Care | Payer: Medicare Other | Source: Ambulatory Visit | Attending: Radiation Oncology | Admitting: Radiation Oncology

## 2016-03-06 DIAGNOSIS — Z510 Encounter for antineoplastic radiation therapy: Secondary | ICD-10-CM | POA: Diagnosis not present

## 2016-03-07 ENCOUNTER — Ambulatory Visit
Admission: RE | Admit: 2016-03-07 | Discharge: 2016-03-07 | Disposition: A | Discharge status: Home / Self Care | Payer: Medicare Other | Source: Ambulatory Visit | Attending: Radiation Oncology | Admitting: Radiation Oncology

## 2016-03-07 DIAGNOSIS — Z510 Encounter for antineoplastic radiation therapy: Secondary | ICD-10-CM | POA: Diagnosis not present

## 2016-03-08 ENCOUNTER — Ambulatory Visit
Admission: RE | Admit: 2016-03-08 | Discharge: 2016-03-08 | Disposition: A | Discharge status: Home / Self Care | Payer: Medicare Other | Source: Ambulatory Visit | Attending: Radiation Oncology | Admitting: Radiation Oncology

## 2016-03-08 DIAGNOSIS — Z510 Encounter for antineoplastic radiation therapy: Secondary | ICD-10-CM | POA: Diagnosis not present

## 2016-03-09 ENCOUNTER — Ambulatory Visit
Admission: RE | Admit: 2016-03-09 | Discharge: 2016-03-09 | Disposition: A | Discharge status: Home / Self Care | Payer: Medicare Other | Source: Ambulatory Visit | Attending: Radiation Oncology | Admitting: Radiation Oncology

## 2016-03-09 DIAGNOSIS — Z510 Encounter for antineoplastic radiation therapy: Secondary | ICD-10-CM | POA: Diagnosis not present

## 2016-03-12 ENCOUNTER — Ambulatory Visit: Payer: Medicare Other | Admitting: Internal Medicine

## 2016-03-12 ENCOUNTER — Other Ambulatory Visit: Payer: Medicare Other

## 2016-03-12 ENCOUNTER — Ambulatory Visit
Admission: RE | Admit: 2016-03-12 | Discharge: 2016-03-12 | Disposition: A | Discharge status: Home / Self Care | Payer: Medicare Other | Source: Ambulatory Visit | Attending: Radiation Oncology | Admitting: Radiation Oncology

## 2016-03-12 DIAGNOSIS — Z510 Encounter for antineoplastic radiation therapy: Secondary | ICD-10-CM | POA: Diagnosis not present

## 2016-03-13 ENCOUNTER — Ambulatory Visit: Payer: Medicare Other

## 2016-03-13 ENCOUNTER — Ambulatory Visit
Admission: RE | Admit: 2016-03-13 | Discharge: 2016-03-13 | Disposition: A | Discharge status: Home / Self Care | Payer: Medicare Other | Source: Ambulatory Visit | Attending: Radiation Oncology | Admitting: Radiation Oncology

## 2016-03-13 DIAGNOSIS — Z510 Encounter for antineoplastic radiation therapy: Secondary | ICD-10-CM | POA: Diagnosis not present

## 2016-03-14 ENCOUNTER — Ambulatory Visit
Admission: RE | Admit: 2016-03-14 | Discharge: 2016-03-14 | Disposition: A | Discharge status: Home / Self Care | Payer: Medicare Other | Source: Ambulatory Visit | Attending: Radiation Oncology | Admitting: Radiation Oncology

## 2016-03-14 ENCOUNTER — Inpatient Hospital Stay: Payer: Medicare Other

## 2016-03-14 ENCOUNTER — Inpatient Hospital Stay (HOSPITAL_BASED_OUTPATIENT_CLINIC_OR_DEPARTMENT_OTHER): Payer: Medicare Other | Admitting: Internal Medicine

## 2016-03-14 ENCOUNTER — Inpatient Hospital Stay: Payer: Medicare Other | Attending: Internal Medicine

## 2016-03-14 VITALS — BP 113/70 | HR 74 | Resp 20

## 2016-03-14 VITALS — BP 118/71 | HR 80 | Temp 97.0°F | Resp 20 | Ht 69.0 in | Wt 187.3 lb

## 2016-03-14 DIAGNOSIS — Z800 Family history of malignant neoplasm of digestive organs: Secondary | ICD-10-CM

## 2016-03-14 DIAGNOSIS — R0789 Other chest pain: Secondary | ICD-10-CM | POA: Insufficient documentation

## 2016-03-14 DIAGNOSIS — J449 Chronic obstructive pulmonary disease, unspecified: Secondary | ICD-10-CM | POA: Diagnosis not present

## 2016-03-14 DIAGNOSIS — Z7984 Long term (current) use of oral hypoglycemic drugs: Secondary | ICD-10-CM

## 2016-03-14 DIAGNOSIS — I10 Essential (primary) hypertension: Secondary | ICD-10-CM | POA: Insufficient documentation

## 2016-03-14 DIAGNOSIS — F17210 Nicotine dependence, cigarettes, uncomplicated: Secondary | ICD-10-CM | POA: Insufficient documentation

## 2016-03-14 DIAGNOSIS — Z7689 Persons encountering health services in other specified circumstances: Secondary | ICD-10-CM | POA: Insufficient documentation

## 2016-03-14 DIAGNOSIS — Z510 Encounter for antineoplastic radiation therapy: Secondary | ICD-10-CM | POA: Diagnosis not present

## 2016-03-14 DIAGNOSIS — C3412 Malignant neoplasm of upper lobe, left bronchus or lung: Secondary | ICD-10-CM | POA: Diagnosis not present

## 2016-03-14 DIAGNOSIS — C3492 Malignant neoplasm of unspecified part of left bronchus or lung: Secondary | ICD-10-CM

## 2016-03-14 DIAGNOSIS — R1310 Dysphagia, unspecified: Secondary | ICD-10-CM | POA: Diagnosis not present

## 2016-03-14 DIAGNOSIS — E860 Dehydration: Secondary | ICD-10-CM | POA: Diagnosis not present

## 2016-03-14 DIAGNOSIS — K5900 Constipation, unspecified: Secondary | ICD-10-CM | POA: Insufficient documentation

## 2016-03-14 DIAGNOSIS — R911 Solitary pulmonary nodule: Secondary | ICD-10-CM

## 2016-03-14 DIAGNOSIS — J45909 Unspecified asthma, uncomplicated: Secondary | ICD-10-CM | POA: Diagnosis not present

## 2016-03-14 DIAGNOSIS — C778 Secondary and unspecified malignant neoplasm of lymph nodes of multiple regions: Secondary | ICD-10-CM

## 2016-03-14 DIAGNOSIS — I252 Old myocardial infarction: Secondary | ICD-10-CM

## 2016-03-14 DIAGNOSIS — E119 Type 2 diabetes mellitus without complications: Secondary | ICD-10-CM | POA: Insufficient documentation

## 2016-03-14 DIAGNOSIS — Z79899 Other long term (current) drug therapy: Secondary | ICD-10-CM | POA: Diagnosis not present

## 2016-03-14 DIAGNOSIS — Z5111 Encounter for antineoplastic chemotherapy: Secondary | ICD-10-CM | POA: Diagnosis present

## 2016-03-14 DIAGNOSIS — M542 Cervicalgia: Secondary | ICD-10-CM | POA: Insufficient documentation

## 2016-03-14 LAB — COMPREHENSIVE METABOLIC PANEL
ALT: 18 U/L (ref 17–63)
ANION GAP: 7 (ref 5–15)
AST: 22 U/L (ref 15–41)
Albumin: 4 g/dL (ref 3.5–5.0)
Alkaline Phosphatase: 54 U/L (ref 38–126)
BUN: 16 mg/dL (ref 6–20)
CHLORIDE: 100 mmol/L — AB (ref 101–111)
CO2: 28 mmol/L (ref 22–32)
Calcium: 8.8 mg/dL — ABNORMAL LOW (ref 8.9–10.3)
Creatinine, Ser: 0.94 mg/dL (ref 0.61–1.24)
Glucose, Bld: 199 mg/dL — ABNORMAL HIGH (ref 65–99)
POTASSIUM: 3.9 mmol/L (ref 3.5–5.1)
Sodium: 135 mmol/L (ref 135–145)
Total Bilirubin: 0.4 mg/dL (ref 0.3–1.2)
Total Protein: 7.5 g/dL (ref 6.5–8.1)

## 2016-03-14 LAB — CBC WITH DIFFERENTIAL/PLATELET
BASOS ABS: 0.1 10*3/uL (ref 0–0.1)
Basophils Relative: 1 %
Eosinophils Absolute: 0 10*3/uL (ref 0–0.7)
Eosinophils Relative: 1 %
HEMATOCRIT: 40.6 % (ref 40.0–52.0)
HEMOGLOBIN: 13.8 g/dL (ref 13.0–18.0)
LYMPHS ABS: 1.2 10*3/uL (ref 1.0–3.6)
LYMPHS PCT: 14 %
MCH: 28.3 pg (ref 26.0–34.0)
MCHC: 34 g/dL (ref 32.0–36.0)
MCV: 83 fL (ref 80.0–100.0)
Monocytes Absolute: 0.6 10*3/uL (ref 0.2–1.0)
Monocytes Relative: 7 %
NEUTROS ABS: 6.6 10*3/uL — AB (ref 1.4–6.5)
NEUTROS PCT: 77 %
Platelets: 268 10*3/uL (ref 150–440)
RBC: 4.89 MIL/uL (ref 4.40–5.90)
RDW: 14.3 % (ref 11.5–14.5)
WBC: 8.6 10*3/uL (ref 3.8–10.6)

## 2016-03-14 MED ORDER — DIPHENHYDRAMINE HCL 50 MG/ML IJ SOLN
25.0000 mg | Freq: Once | INTRAMUSCULAR | Status: AC
  Administered 2016-03-14: 25 mg via INTRAVENOUS

## 2016-03-14 MED ORDER — HYDROCORTISONE NA SUCCINATE PF 100 MG IJ SOLR
100.0000 mg | Freq: Once | INTRAMUSCULAR | Status: AC
  Administered 2016-03-14: 100 mg via INTRAVENOUS

## 2016-03-14 MED ORDER — PALONOSETRON HCL INJECTION 0.25 MG/5ML
0.2500 mg | Freq: Once | INTRAVENOUS | Status: AC
  Administered 2016-03-14: 0.25 mg via INTRAVENOUS
  Filled 2016-03-14: qty 5

## 2016-03-14 MED ORDER — MORPHINE SULFATE (PF) 2 MG/ML IV SOLN: 1.0000 mg | Freq: Once | INTRAVENOUS | Status: DC

## 2016-03-14 MED ORDER — SODIUM CHLORIDE 0.9 % IV SOLN
10.0000 mg | Freq: Once | INTRAVENOUS | Status: AC
  Administered 2016-03-14: 10 mg via INTRAVENOUS
  Filled 2016-03-14: qty 1

## 2016-03-14 MED ORDER — SODIUM CHLORIDE 0.9 % IV SOLN
100.0000 mg/m2 | Freq: Once | INTRAVENOUS | Status: AC
  Administered 2016-03-14: 210 mg via INTRAVENOUS
  Filled 2016-03-14: qty 10.5

## 2016-03-14 MED ORDER — HEPARIN SOD (PORK) LOCK FLUSH 100 UNIT/ML IV SOLN
500.0000 [IU] | Freq: Once | INTRAVENOUS | Status: AC | PRN
  Administered 2016-03-14: 500 [IU]
  Filled 2016-03-14 (×2): qty 5

## 2016-03-14 MED ORDER — MORPHINE SULFATE 4 MG/ML IJ SOLN: 1.0000 mg | Freq: Once | INTRAMUSCULAR | Status: DC

## 2016-03-14 MED ORDER — SODIUM CHLORIDE 0.9 % IV SOLN
527.5000 mg | Freq: Once | INTRAVENOUS | Status: AC
  Administered 2016-03-14: 530 mg via INTRAVENOUS
  Filled 2016-03-14: qty 53

## 2016-03-14 MED ORDER — SODIUM CHLORIDE 0.9 % IV SOLN
Freq: Once | INTRAVENOUS | Status: AC
  Administered 2016-03-14: 11:00:00 via INTRAVENOUS
  Filled 2016-03-14: qty 1000

## 2016-03-14 MED ORDER — MORPHINE SULFATE (PF) 2 MG/ML IV SOLN
1.0000 mg | Freq: Once | INTRAVENOUS | Status: AC
Start: 2016-03-14 — End: 2016-03-14
  Administered 2016-03-14: 1 mg via INTRAVENOUS
  Filled 2016-03-14: qty 1

## 2016-03-14 NOTE — Progress Notes (Signed)
[facility] OFF[facility] PROGRESS NOTE  Patient Care Team: Lottie Mussel III, MD as PCP - General (Internal Medicine)   SUMMARY OF ONCOLOGIC HISTORY:  # April-MAY 2017 SMALL CELL LUNG CANCER EXTENSIVE STAGE  LUL #1 4.1x 3.5cm;#2- 3.3x3.0 #3 Left pleural based nodule 19m [new]; Left apical radiation changes. RUL nodule- 12x10 mm-stable/benign; LEft supraclav LN/ right hilarLN/subcarinal LN/ Left axillary LN; MAY 17th 2017- START Carbo-Etop q3 W  # Left Chest pain- Pal RT [may 17th]  # [facility]- NEG/ Smoking  # SEP 2009- SQUAMOUS CELL CA LUL T2/T3 [Stage IIB] s/p Chemo-RT [decined Surgery]  INTERVAL HISTORY:  75year old male patient with extensive stage small cell lung cancer currently on first line therapy with carboplatin etoposide chemotherapy- status post cycle #1 approximately 3 weeks ago.  Patient complains of worsening left chest wall pain in the last few days. Patient has not been taking any pain medications. Today's his last day of his palliative radiation to the chest wall.  No nausea no vomiting. Appetite good. No weight loss. Patient has chronic mild shortness of breath or chronic cough. No hoarseness of voice.   REVIEW OF SYSTEMS:  A complete 10 point review of system is done which is negative except mentioned above/history of present illness.   PAST MEDICAL HISTORY :  Past Medical History  Diagnosis Date  . [place] ([place]   . COPD (chronic obstructive pulmonary disease) ([place]   . Hypertension   . Diabetes mellitus without complication ([place]   . Lung cancer, upper lobe ([place] 2009  . Asthma     PAST SURGICAL HISTORY :   Past Surgical History  Procedure Laterality Date  . Throat surgery    . Tumor removal Right     "years ago-right lower posterior side"  . Peripheral vascular catheterization N/A 02/21/2016    Procedure: PGlori LuisCath Insertion;  Surgeon: JAlgernon Huxley MD;  [facility];  Service: Cardiovascular;  Laterality: N/A;     FAMILY HISTORY :   Family History  Problem Relation Age of Onset  . Cancer - Colon Father   . Cancer - Colon Sister     SOCIAL HISTORY:   Social History  Substance Use Topics  . Smoking status: Current Every Day Smoker -- 1.00 packs/day for 62 years    Types: Cigarettes  . Smokeless tobacco: Not on file  . Alcohol Use: 3.6 oz/week    6 Cans of beer per week     Comment: rarely    ALLERGIES:  is allergic to clarithromycin and penicillins.  MEDICATIONS:  Current Outpatient Prescriptions  Medication Sig Dispense Refill  . albuterol (PROAIR HFA) 108 (90 Base) MCG/ACT inhaler Inhale 2 puffs into the lungs every 6 (six) hours as needed.    .Marland Kitchenalbuterol (PROVENTIL) (2.5 MG/3ML) 0.083% nebulizer solution Inhale 3 mLs into the lungs every 12 (twelve) hours as needed.    .Marland Kitchenglucose blood test strip     . lidocaine-prilocaine (EMLA) cream Apply 1 application topically as needed. 30 g 6  . metFORMIN (GLUCOPHAGE) 1000 MG tablet Take 1,000 mg by mouth 2 (two) times daily. Reported on 02/22/2016    . traMADol (ULTRAM) 50 MG tablet Take 50 mg by mouth every 6 (six) hours as needed. for pain  3  . ondansetron (ZOFRAN) 8 MG tablet Take 1 tablet (8 mg total) by mouth every 8 (eight) hours as needed for nausea or vomiting (start 3 days; after chemo). (Patient not taking: Reported on 03/14/2016)  40 tablet 0  . prochlorperazine (COMPAZINE) 10 MG tablet Take 1 tablet (10 mg total) by mouth every 6 (six) hours as needed for nausea or vomiting. (Patient not taking: Reported on 03/14/2016) 30 tablet 0  . Shark Cartilage 500 MG CAPS Take by mouth. Reported on 03/14/2016     No current facility-administered medications for this visit.    PHYSICAL EXAMINATION: ECOG PERFORMANCE STATUS: 1 - Symptomatic but completely ambulatory  BP 118/71 mmHg  Pulse 80  Temp(Src) 97 F (36.1 C) (Oral)  Resp 20  Ht '5\' 9"'$  (1.753 m)  Wt 187 lb 4.5 oz (84.95 kg)  BMI 27.64 kg/m2  Filed Weights   03/14/16 0928  Weight:  187 lb 4.5 oz (84.95 kg)    GENERAL: Well-nourished well-developed; Alert, no distress and comfortable.   Accompanied by wife /1daughter  EYES: no pallor or icterus OROPHARYNX: no thrush or ulceration; poor dentition  NECK: supple, no masses felt LYMPH:  no palpable lymphadenopathy in the cervical, axillary or inguinal regions LUNGS: Decreased air entry bilaterally.  No wheeze or crackles HEART/CVS: regular rate & rhythm and no murmurs; No lower extremity edema ABDOMEN:abdomen soft, non-tender and normal bowel sounds Musculoskeletal:no cyanosis of digits and no clubbing; tenderness noted in the left chest wall. No lumps or bumps noted. PSYCH: alert & oriented x 3 with fluent speech NEURO: no focal motor/sensory deficits SKIN:  no rashes or significant lesions  LABORATORY DATA:  I have reviewed the data as listed    Component Value Date/Time   NA 135 03/14/2016 0845   K 3.9 03/14/2016 0845   CL 100* 03/14/2016 0845   CO2 28 03/14/2016 0845   GLUCOSE 199* 03/14/2016 0845   BUN 16 03/14/2016 0845   CREATININE 0.94 03/14/2016 0845   CREATININE 0.99 02/26/2014 1033   CALCIUM 8.8* 03/14/2016 0845   CALCIUM 8.2* 02/26/2014 1033   PROT 7.5 03/14/2016 0845   PROT 7.3 02/26/2014 1033   ALBUMIN 4.0 03/14/2016 0845   ALBUMIN 3.4 02/26/2014 1033   AST 22 03/14/2016 0845   AST 17 02/26/2014 1033   ALT 18 03/14/2016 0845   ALT 25 02/26/2014 1033   ALKPHOS 54 03/14/2016 0845   ALKPHOS 54 02/26/2014 1033   BILITOT 0.4 03/14/2016 0845   BILITOT 0.4 02/26/2014 1033   GFRNONAA >60 03/14/2016 0845   GFRNONAA >60 02/26/2014 1033   GFRAA >60 03/14/2016 0845   GFRAA >60 02/26/2014 1033    No results found for: SPEP, UPEP  Lab Results  Component Value Date   WBC 8.6 03/14/2016   NEUTROABS 6.6* 03/14/2016   HGB 13.8 03/14/2016   HCT 40.6 03/14/2016   MCV 83.0 03/14/2016   PLT 268 03/14/2016      Chemistry      Component Value Date/Time   NA 135 03/14/2016 0845   K 3.9 03/14/2016  0845   CL 100* 03/14/2016 0845   CO2 28 03/14/2016 0845   BUN 16 03/14/2016 0845   CREATININE 0.94 03/14/2016 0845   CREATININE 0.99 02/26/2014 1033      Component Value Date/Time   CALCIUM 8.8* 03/14/2016 0845   CALCIUM 8.2* 02/26/2014 1033   ALKPHOS 54 03/14/2016 0845   ALKPHOS 54 02/26/2014 1033   AST 22 03/14/2016 0845   AST 17 02/26/2014 1033   ALT 18 03/14/2016 0845   ALT 25 02/26/2014 1033   BILITOT 0.4 03/14/2016 0845   BILITOT 0.4 02/26/2014 1033      ASSESSMENT & PLAN:   # Small cell  lung cancer- Extensive stage small cell lung cancer-Status post cycle #1 of carbo etoposide approximately 3 weeks ago. Also on palliative radiation left chest wall. Patient seems to be tolerating chemotherapy well; No significant improvement of pain.  # Proceed with cycle #2 chemotherapy today. CBC CMP within normal limits. Given the absence of significant improvement of pain- get CT scan after this cycle few days before the next visit.  # Left chest wall pain- sec to malignancy. As the patient notes significant improvement. Discussed regarding taking tramadol and regular basis. Patient not wanting narcotics secondary to constipation.   # Constipation discussed regarding the MiraLAX/ stools softener  # follow-up with me in 10 days; in cycle #3 in 3 weeks CT scan prior.     Cammie Sickle, MD 03/14/2016 9:50 AM

## 2016-03-14 NOTE — Progress Notes (Signed)
03/14/16 1203 VP16 stopped due to pt's c/o chest pain, O2 sats 94% on RA, BP 120/71, Dr Gildardo Griffes called to chair side, Benadyrl '25mg'$  given IVP, solumedrol '100mg'$  given IVP, HR 94, sats up to 96-97%, pt c/o lower abd left lateral, morphine '1mg'$  given for pain as ordered, chemo held for 76mnutes as ordered and then restarted without incident

## 2016-03-15 ENCOUNTER — Inpatient Hospital Stay: Payer: Medicare Other

## 2016-03-15 VITALS — BP 139/61 | HR 81 | Temp 97.5°F

## 2016-03-15 DIAGNOSIS — C3412 Malignant neoplasm of upper lobe, left bronchus or lung: Secondary | ICD-10-CM

## 2016-03-15 DIAGNOSIS — Z5111 Encounter for antineoplastic chemotherapy: Secondary | ICD-10-CM | POA: Diagnosis not present

## 2016-03-15 DIAGNOSIS — C3492 Malignant neoplasm of unspecified part of left bronchus or lung: Secondary | ICD-10-CM

## 2016-03-15 MED ORDER — SODIUM CHLORIDE 0.9 % IV SOLN
Freq: Once | INTRAVENOUS | Status: AC
  Administered 2016-03-15: 14:00:00 via INTRAVENOUS
  Filled 2016-03-15: qty 1000

## 2016-03-15 MED ORDER — HEPARIN SOD (PORK) LOCK FLUSH 100 UNIT/ML IV SOLN
500.0000 [IU] | Freq: Once | INTRAVENOUS | Status: AC | PRN
  Administered 2016-03-15: 500 [IU]
  Filled 2016-03-15: qty 5

## 2016-03-15 MED ORDER — SODIUM CHLORIDE 0.9 % IV SOLN
100.0000 mg/m2 | Freq: Once | INTRAVENOUS | Status: AC
  Administered 2016-03-15: 210 mg via INTRAVENOUS
  Filled 2016-03-15: qty 10.5

## 2016-03-15 MED ORDER — SODIUM CHLORIDE 0.9% FLUSH
10.0000 mL | INTRAVENOUS | Status: DC | PRN
  Filled 2016-03-15: qty 10

## 2016-03-15 MED ORDER — SODIUM CHLORIDE 0.9 % IV SOLN
10.0000 mg | Freq: Once | INTRAVENOUS | Status: AC
  Administered 2016-03-15: 10 mg via INTRAVENOUS
  Filled 2016-03-15: qty 1

## 2016-03-15 NOTE — Progress Notes (Signed)
Patients port was accessed when he arrived, originally accessed on 03/14/98.  Dried bloody drainage present and patient reported that it was tender.  Good blood return noted.  When etoposide infusion completed flushed port with heparin and deaccessed needle.  Cleaned dried drainage off with prevantics antiseptic and covered with pink foam alevyn dressing.

## 2016-03-16 ENCOUNTER — Inpatient Hospital Stay: Payer: Medicare Other

## 2016-03-16 DIAGNOSIS — Z5111 Encounter for antineoplastic chemotherapy: Secondary | ICD-10-CM | POA: Diagnosis not present

## 2016-03-16 DIAGNOSIS — C3412 Malignant neoplasm of upper lobe, left bronchus or lung: Secondary | ICD-10-CM

## 2016-03-16 DIAGNOSIS — C3492 Malignant neoplasm of unspecified part of left bronchus or lung: Secondary | ICD-10-CM

## 2016-03-16 MED ORDER — SODIUM CHLORIDE 0.9% FLUSH
10.0000 mL | INTRAVENOUS | Status: DC | PRN
  Administered 2016-03-16: 10 mL
  Filled 2016-03-16: qty 10

## 2016-03-16 MED ORDER — ETOPOSIDE CHEMO INJECTION 1 GM/50ML
100.0000 mg/m2 | Freq: Once | INTRAVENOUS | Status: AC
  Administered 2016-03-16: 210 mg via INTRAVENOUS
  Filled 2016-03-16: qty 10.5

## 2016-03-16 MED ORDER — HEPARIN SOD (PORK) LOCK FLUSH 100 UNIT/ML IV SOLN
500.0000 [IU] | Freq: Once | INTRAVENOUS | Status: AC | PRN
  Administered 2016-03-16: 500 [IU]
  Filled 2016-03-16: qty 5

## 2016-03-16 MED ORDER — PEGFILGRASTIM 6 MG/0.6ML ~~LOC~~ PSKT
6.0000 mg | PREFILLED_SYRINGE | Freq: Once | SUBCUTANEOUS | Status: AC
  Administered 2016-03-16: 6 mg via SUBCUTANEOUS
  Filled 2016-03-16: qty 0.6

## 2016-03-16 MED ORDER — SODIUM CHLORIDE 0.9 % IV SOLN
10.0000 mg | Freq: Once | INTRAVENOUS | Status: AC
  Administered 2016-03-16: 10 mg via INTRAVENOUS
  Filled 2016-03-16: qty 1

## 2016-03-16 MED ORDER — SODIUM CHLORIDE 0.9 % IV SOLN
Freq: Once | INTRAVENOUS | Status: AC
  Administered 2016-03-16: 15:00:00 via INTRAVENOUS
  Filled 2016-03-16: qty 1000

## 2016-03-28 ENCOUNTER — Inpatient Hospital Stay: Payer: Medicare Other

## 2016-03-28 DIAGNOSIS — Z5111 Encounter for antineoplastic chemotherapy: Secondary | ICD-10-CM | POA: Diagnosis not present

## 2016-03-28 DIAGNOSIS — C3492 Malignant neoplasm of unspecified part of left bronchus or lung: Secondary | ICD-10-CM

## 2016-03-28 LAB — BASIC METABOLIC PANEL
ANION GAP: 3 — AB (ref 5–15)
BUN: 17 mg/dL (ref 6–20)
CALCIUM: 8.7 mg/dL — AB (ref 8.9–10.3)
CO2: 31 mmol/L (ref 22–32)
CREATININE: 0.87 mg/dL (ref 0.61–1.24)
Chloride: 99 mmol/L — ABNORMAL LOW (ref 101–111)
Glucose, Bld: 127 mg/dL — ABNORMAL HIGH (ref 65–99)
Potassium: 3.8 mmol/L (ref 3.5–5.1)
SODIUM: 133 mmol/L — AB (ref 135–145)

## 2016-03-28 LAB — CBC WITH DIFFERENTIAL/PLATELET
BASOS ABS: 0.1 10*3/uL (ref 0–0.1)
BASOS PCT: 1 %
EOS ABS: 0.1 10*3/uL (ref 0–0.7)
EOS PCT: 1 %
HCT: 38.9 % — ABNORMAL LOW (ref 40.0–52.0)
Hemoglobin: 13.1 g/dL (ref 13.0–18.0)
Lymphocytes Relative: 20 %
Lymphs Abs: 1.3 10*3/uL (ref 1.0–3.6)
MCH: 28.5 pg (ref 26.0–34.0)
MCHC: 33.8 g/dL (ref 32.0–36.0)
MCV: 84.2 fL (ref 80.0–100.0)
MONO ABS: 0.6 10*3/uL (ref 0.2–1.0)
Monocytes Relative: 9 %
NEUTROS ABS: 4.7 10*3/uL (ref 1.4–6.5)
Neutrophils Relative %: 69 %
PLATELETS: 71 10*3/uL — AB (ref 150–440)
RBC: 4.62 MIL/uL (ref 4.40–5.90)
RDW: 15.1 % — AB (ref 11.5–14.5)
WBC: 6.8 10*3/uL (ref 3.8–10.6)

## 2016-03-30 ENCOUNTER — Ambulatory Visit
Admission: RE | Admit: 2016-03-30 | Discharge: 2016-03-30 | Disposition: A | Discharge status: Home / Self Care | Payer: Medicare Other | Source: Ambulatory Visit | Attending: Internal Medicine | Admitting: Internal Medicine

## 2016-03-30 ENCOUNTER — Telehealth: Payer: Self-pay | Admitting: Internal Medicine

## 2016-03-30 DIAGNOSIS — Z923 Personal history of irradiation: Secondary | ICD-10-CM | POA: Diagnosis not present

## 2016-03-30 DIAGNOSIS — C3492 Malignant neoplasm of unspecified part of left bronchus or lung: Secondary | ICD-10-CM | POA: Diagnosis present

## 2016-03-30 MED ORDER — IOPAMIDOL (ISOVUE-300) INJECTION 61%
75.0000 mL | Freq: Once | INTRAVENOUS | Status: AC | PRN
  Administered 2016-03-30: 75 mL via INTRAVENOUS

## 2016-03-30 NOTE — Telephone Encounter (Signed)
I Informed the patient that that the CT scan shows improvement response. Follow-up as planned

## 2016-04-02 ENCOUNTER — Other Ambulatory Visit: Payer: Self-pay | Admitting: *Deleted

## 2016-04-02 MED ORDER — SUCRALFATE 1 G PO TABS: 1.0000 g | ORAL_TABLET | Freq: Three times a day (TID) | ORAL | Status: DC

## 2016-04-04 ENCOUNTER — Inpatient Hospital Stay: Payer: Medicare Other

## 2016-04-04 ENCOUNTER — Inpatient Hospital Stay (HOSPITAL_BASED_OUTPATIENT_CLINIC_OR_DEPARTMENT_OTHER): Payer: Medicare Other | Admitting: Internal Medicine

## 2016-04-04 VITALS — BP 133/68 | HR 85 | Temp 98.6°F | Resp 20 | Wt 186.1 lb

## 2016-04-04 DIAGNOSIS — Z7689 Persons encountering health services in other specified circumstances: Secondary | ICD-10-CM | POA: Diagnosis not present

## 2016-04-04 DIAGNOSIS — I10 Essential (primary) hypertension: Secondary | ICD-10-CM

## 2016-04-04 DIAGNOSIS — Z79899 Other long term (current) drug therapy: Secondary | ICD-10-CM

## 2016-04-04 DIAGNOSIS — C3492 Malignant neoplasm of unspecified part of left bronchus or lung: Secondary | ICD-10-CM

## 2016-04-04 DIAGNOSIS — M542 Cervicalgia: Secondary | ICD-10-CM

## 2016-04-04 DIAGNOSIS — C778 Secondary and unspecified malignant neoplasm of lymph nodes of multiple regions: Secondary | ICD-10-CM | POA: Diagnosis not present

## 2016-04-04 DIAGNOSIS — R1310 Dysphagia, unspecified: Secondary | ICD-10-CM | POA: Diagnosis not present

## 2016-04-04 DIAGNOSIS — R0789 Other chest pain: Secondary | ICD-10-CM

## 2016-04-04 DIAGNOSIS — F17210 Nicotine dependence, cigarettes, uncomplicated: Secondary | ICD-10-CM

## 2016-04-04 DIAGNOSIS — R911 Solitary pulmonary nodule: Secondary | ICD-10-CM

## 2016-04-04 DIAGNOSIS — E119 Type 2 diabetes mellitus without complications: Secondary | ICD-10-CM

## 2016-04-04 DIAGNOSIS — I252 Old myocardial infarction: Secondary | ICD-10-CM

## 2016-04-04 DIAGNOSIS — C3412 Malignant neoplasm of upper lobe, left bronchus or lung: Secondary | ICD-10-CM

## 2016-04-04 DIAGNOSIS — Z800 Family history of malignant neoplasm of digestive organs: Secondary | ICD-10-CM

## 2016-04-04 DIAGNOSIS — J45909 Unspecified asthma, uncomplicated: Secondary | ICD-10-CM

## 2016-04-04 DIAGNOSIS — C801 Malignant (primary) neoplasm, unspecified: Secondary | ICD-10-CM

## 2016-04-04 DIAGNOSIS — Z7984 Long term (current) use of oral hypoglycemic drugs: Secondary | ICD-10-CM

## 2016-04-04 DIAGNOSIS — J449 Chronic obstructive pulmonary disease, unspecified: Secondary | ICD-10-CM

## 2016-04-04 DIAGNOSIS — E860 Dehydration: Secondary | ICD-10-CM

## 2016-04-04 LAB — CBC WITH DIFFERENTIAL/PLATELET
Basophils Absolute: 0.1 10*3/uL (ref 0–0.1)
Basophils Relative: 1 %
Eosinophils Absolute: 0 10*3/uL (ref 0–0.7)
Eosinophils Relative: 1 %
HCT: 38.6 % — ABNORMAL LOW (ref 40.0–52.0)
Hemoglobin: 13.3 g/dL (ref 13.0–18.0)
Lymphocytes Relative: 14 %
Lymphs Abs: 1.1 10*3/uL (ref 1.0–3.6)
MCH: 28.9 pg (ref 26.0–34.0)
MCHC: 34.6 g/dL (ref 32.0–36.0)
MCV: 83.6 fL (ref 80.0–100.0)
Monocytes Absolute: 1.6 10*3/uL — ABNORMAL HIGH (ref 0.2–1.0)
Monocytes Relative: 20 %
Neutro Abs: 4.9 10*3/uL (ref 1.4–6.5)
Neutrophils Relative %: 64 %
Platelets: 240 10*3/uL (ref 150–440)
RBC: 4.61 MIL/uL (ref 4.40–5.90)
RDW: 16.3 % — ABNORMAL HIGH (ref 11.5–14.5)
WBC: 7.6 10*3/uL (ref 3.8–10.6)

## 2016-04-04 LAB — COMPREHENSIVE METABOLIC PANEL
ALT: 12 U/L — ABNORMAL LOW (ref 17–63)
AST: 14 U/L — ABNORMAL LOW (ref 15–41)
Albumin: 3.6 g/dL (ref 3.5–5.0)
Alkaline Phosphatase: 50 U/L (ref 38–126)
Anion gap: 8 (ref 5–15)
BUN: 10 mg/dL (ref 6–20)
CO2: 30 mmol/L (ref 22–32)
Calcium: 8.9 mg/dL (ref 8.9–10.3)
Chloride: 93 mmol/L — ABNORMAL LOW (ref 101–111)
Creatinine, Ser: 0.83 mg/dL (ref 0.61–1.24)
GFR calc Af Amer: >60 mL/min (ref >60)
GFR calc non Af Amer: >60 mL/min (ref >60)
Glucose, Bld: 141 mg/dL — ABNORMAL HIGH (ref 65–99)
Potassium: 4.4 mmol/L (ref 3.5–5.1)
Sodium: 131 mmol/L — ABNORMAL LOW (ref 135–145)
Total Bilirubin: 0.8 mg/dL (ref 0.3–1.2)
Total Protein: 8 g/dL (ref 6.5–8.1)

## 2016-04-04 MED ORDER — MAGIC MOUTHWASH: 5.0000 mL | Freq: Four times a day (QID) | ORAL | Status: DC | PRN

## 2016-04-04 MED ORDER — HEPARIN SOD (PORK) LOCK FLUSH 100 UNIT/ML IV SOLN
500.0000 [IU] | Freq: Once | INTRAVENOUS | Status: AC
  Administered 2016-04-04: 500 [IU] via INTRAVENOUS

## 2016-04-04 MED ORDER — HEPARIN SOD (PORK) LOCK FLUSH 100 UNIT/ML IV SOLN
INTRAVENOUS | Status: AC
  Filled 2016-04-04: qty 5

## 2016-04-04 MED ORDER — CEPHALEXIN 500 MG PO CAPS: 500.0000 mg | ORAL_CAPSULE | Freq: Three times a day (TID) | ORAL | Status: DC

## 2016-04-04 MED ORDER — SODIUM CHLORIDE 0.9 % IV SOLN
INTRAVENOUS | Status: DC | PRN
  Administered 2016-04-04: 10:00:00 via INTRAVENOUS
  Filled 2016-04-04: qty 1000

## 2016-04-04 NOTE — Progress Notes (Signed)
Patient states his throat has been sore since the weekend.  He is swollen under his chin.  Cannot swallow. States he is hungry but cannot eat.  States he has run temperature up to 100 through Monday.  No temp today 98.6.  Wants to skip treatment today.

## 2016-04-04 NOTE — Assessment & Plan Note (Addendum)
#   Small cell lung cancer- Extensive stage small cell lung cancer-Status post cycle #2 of carbo etoposide approximately 3 weeks ago. Also on palliative radiation left chest wall.;  CT scan shows significant improvement of the left upper lobe lung mass;  Shows stable previous radiation changes.   I reviewed the images myself and with the patient and family in detail. A copy of this report was given.  #  Hold cycle #3 today [ the discussion below].  Reevaluate in 2 weeks for cycle #3  #  Neck pain/ swelling-  Unclear etiology.  Start patient on antibiotics Keflex 500 mg 3 times a day.  As the patient to call us to let us know if his symptoms does not improve-  Then plan a CT scan of the neck.  He'll call us back in 2 days if no symptoms improved  # dehydration- IV fluids today.   ## follow up in 2 weeks/ chemoagain

## 2016-04-04 NOTE — Progress Notes (Signed)
Richard Jimenez OFFICE PROGRESS NOTE  Patient Care Team: Richard Brunner, MD as PCP - General (Internal Medicine)   SUMMARY OF ONCOLOGIC HISTORY:  Oncology History   # April-MAY 2017 SMALL CELL LUNG CANCER EXTENSIVE STAGE  LUL #1 4.1x 3.5cm;#2- 3.3x3.0 #3 Left pleural based nodule 102m [new]; Left apical radiation changes. RUL nodule- 12x10 mm-stable/benign; LEft supraclav LN/ right hilarLN/subcarinal LN/ Left axillary LN; MAY 17th 2017- START Carbo-Etop q3 W; June 23rd CT- Significant PR  # Left Chest pain- Pal RT [may 17th]  # [facility]- NEG/ Smoking  # SEP 2009- SQUAMOUS CELL CA LUL T2/T3 [Stage IIB] s/p Chemo-RT [decined Surgery]     [place] ([place]   02/06/2016 Initial Diagnosis Small cell lung cancer ([place]     INTERVAL HISTORY:  75year old male patient with extensive stage small cell lung cancer currently on first line therapy with carboplatin etoposide chemotherapy- status post cycle #2 approximately 3 weeks ago/CT scan review.   patient states this has been feeling poorly for the last few days. Complaining of pain in his neck/ anteriorly;  And also pain with swallowing. No nausea no vomiting. Poor appetite. Poor by mouth intake. Feels dizzy.   Patient not taking any pain medication for this left chest wall pain/resolved.  Patient has chronic mild shortness of breath or chronic cough. No hoarseness of voice.   REVIEW OF SYSTEMS:  A complete 10 point review of system is done which is negative except mentioned above/history of present illness.   PAST MEDICAL HISTORY :  Past Medical History  Diagnosis Date  . Myocardial infarct ([place]   . COPD (chronic obstructive pulmonary disease) ([place]   . Hypertension   . Diabetes mellitus without complication ([place]   . Asthma   . Lung cancer, upper lobe ([place] 2009    PAST SURGICAL HISTORY :   Past Surgical History  Procedure Laterality Date  . Throat surgery    . Tumor removal Right     "years  ago-right lower posterior side"  . Peripheral vascular catheterization N/A 02/21/2016    Procedure: PGlori LuisCath Insertion;  Surgeon: JAlgernon Huxley MD;  [facility];  Service: Cardiovascular;  Laterality: N/A;    FAMILY HISTORY :   Family History  Problem Relation Age of Onset  . Cancer - Colon Father   . Cancer - Colon Sister     SOCIAL HISTORY:   Social History  Substance Use Topics  . Smoking status: Current Every Day Smoker -- 1.00 packs/day for 62 years    Types: Cigarettes  . Smokeless tobacco: Not on file  . Alcohol Use: 3.6 oz/week    6 Cans of beer per week     Comment: rarely    ALLERGIES:  is allergic to clarithromycin and penicillins.  MEDICATIONS:  Current Outpatient Prescriptions  Medication Sig Dispense Refill  . albuterol (PROAIR HFA) 108 (90 Base) MCG/ACT inhaler Inhale 2 puffs into the lungs every 6 (six) hours as needed.    .Marland Kitchenalbuterol (PROVENTIL) (2.5 MG/3ML) 0.083% nebulizer solution Inhale 3 mLs into the lungs every 12 (twelve) hours as needed.    .Marland Kitchenglucose blood test strip     . lidocaine-prilocaine (EMLA) cream Apply 1 application topically as needed. 30 g 6  . metFORMIN (GLUCOPHAGE) 1000 MG tablet Take 1,000 mg by mouth 2 (two) times daily. Reported on 02/22/2016    . ondansetron (ZOFRAN) 8 MG tablet Take 1 tablet (8 mg total) by mouth  every 8 (eight) hours as needed for nausea or vomiting (start 3 days; after chemo). 40 tablet 0  . prochlorperazine (COMPAZINE) 10 MG tablet Take 1 tablet (10 mg total) by mouth every 6 (six) hours as needed for nausea or vomiting. 30 tablet 0  . Shark Cartilage 500 MG CAPS Take by mouth. Reported on 03/14/2016    . sucralfate (CARAFATE) 1 g tablet Take 1 tablet (1 g total) by mouth 3 (three) times daily before meals. Dissolve tablets in 4 tablespoons of warm water; swish and swallow 90 tablet 3  . traMADol (ULTRAM) 50 MG tablet Take 50 mg by mouth every 6 (six) hours as needed. for pain  3  . cephALEXin  (KEFLEX) 500 MG capsule Take 1 capsule (500 mg total) by mouth 3 (three) times daily. 30 capsule 0  . magic mouthwash SOLN Take 5 mLs by mouth 4 (four) times daily as needed for mouth pain. 200 mL 3   Current Facility-Administered Medications  Medication Dose Route Frequency Provider Last Rate Last Dose  . 0.9 %  sodium chloride infusion   Intravenous PRN Cammie Sickle, MD   Stopped at 04/04/16 1209    PHYSICAL EXAMINATION: ECOG PERFORMANCE STATUS: 1 - Symptomatic but completely ambulatory  BP 133/68 mmHg  Pulse 85  Temp(Src) 98.6 F (37 C) (Tympanic)  Resp 20  Wt 186 lb 1.1 oz (84.4 kg)  Filed Weights   04/04/16 0910  Weight: 186 lb 1.1 oz (84.4 kg)    GENERAL: Well-nourished well-developed; Alert, no distress and comfortable.   Accompanied by wife /1daughter  EYES: no pallor or icterus OROPHARYNX: no thrush or ulceration; poor dentition; Bil ear wax  NECK: vague swelling under the neck; tender; no skin changes.  LYMPH:  no palpable lymphadenopathy in the cervical, axillary or inguinal regions LUNGS: Decreased air entry bilaterally.  No wheeze or crackles HEART/CVS: regular rate & rhythm and no murmurs; No lower extremity edema ABDOMEN:abdomen soft, non-tender and normal bowel sounds Musculoskeletal:no cyanosis of digits and no clubbing; NO tenderness noted in the left chest wall. No lumps or bumps noted. PSYCH: alert & oriented x 3 with fluent speech NEURO: no focal motor/sensory deficits SKIN:  no rashes or significant lesions  LABORATORY DATA:  I have reviewed the data as listed    Component Value Date/Time   NA 131* 04/04/2016 0837   K 4.4 04/04/2016 0837   CL 93* 04/04/2016 0837   CO2 30 04/04/2016 0837   GLUCOSE 141* 04/04/2016 0837   BUN 10 04/04/2016 0837   CREATININE 0.83 04/04/2016 0837   CREATININE 0.99 02/26/2014 1033   CALCIUM 8.9 04/04/2016 0837   CALCIUM 8.2* 02/26/2014 1033   PROT 8.0 04/04/2016 0837   PROT 7.3 02/26/2014 1033   ALBUMIN 3.6  04/04/2016 0837   ALBUMIN 3.4 02/26/2014 1033   AST 14* 04/04/2016 0837   AST 17 02/26/2014 1033   ALT 12* 04/04/2016 0837   ALT 25 02/26/2014 1033   ALKPHOS 50 04/04/2016 0837   ALKPHOS 54 02/26/2014 1033   BILITOT 0.8 04/04/2016 0837   BILITOT 0.4 02/26/2014 1033   GFRNONAA >60 04/04/2016 0837   GFRNONAA >60 02/26/2014 1033   GFRAA >60 04/04/2016 0837   GFRAA >60 02/26/2014 1033    No results found for: SPEP, UPEP  Lab Results  Component Value Date   WBC 7.6 04/04/2016   NEUTROABS 4.9 04/04/2016   HGB 13.3 04/04/2016   HCT 38.6* 04/04/2016   MCV 83.6 04/04/2016   PLT 240  04/04/2016      Chemistry      Component Value Date/Time   NA 131* 04/04/2016 0837   K 4.4 04/04/2016 0837   CL 93* 04/04/2016 0837   CO2 30 04/04/2016 0837   BUN 10 04/04/2016 0837   CREATININE 0.83 04/04/2016 0837   CREATININE 0.99 02/26/2014 1033      Component Value Date/Time   CALCIUM 8.9 04/04/2016 0837   CALCIUM 8.2* 02/26/2014 1033   ALKPHOS 50 04/04/2016 0837   ALKPHOS 54 02/26/2014 1033   AST 14* 04/04/2016 0837   AST 17 02/26/2014 1033   ALT 12* 04/04/2016 0837   ALT 25 02/26/2014 1033   BILITOT 0.8 04/04/2016 0837   BILITOT 0.4 02/26/2014 1033      ASSESSMENT & PLAN:   Small cell lung cancer ([place]) # Small cell lung cancer- Extensive stage small cell lung cancer-Status post cycle #2 of carbo etoposide approximately 3 weeks ago. Also on palliative radiation left chest wall.;  CT scan shows significant improvement of the left upper lobe lung mass;  Shows stable previous radiation changes.   I reviewed the images myself and with the patient and family in detail. A copy of this report was given.  #  Hold cycle #3 today [ the discussion below].  Reevaluate in 2 weeks for cycle #3  #  Neck pain/ swelling-  Unclear etiology.  Start patient on antibiotics Keflex 500 mg 3 times a day.  As the patient to call us to let us know if his symptoms does not improve-  Then plan a CT scan of  the neck.  He'll call us back in 2 days if no symptoms improved  # dehydration- IV fluids today.   ## follow up in 2 weeks/ chemoagain      Cammie Sickle, MD 04/04/2016 5:45 PM

## 2016-04-05 ENCOUNTER — Inpatient Hospital Stay: Payer: Medicare Other

## 2016-04-06 ENCOUNTER — Telehealth: Payer: Self-pay | Admitting: Internal Medicine

## 2016-04-06 ENCOUNTER — Inpatient Hospital Stay
Admission: EM | Admit: 2016-04-06 | Discharge: 2016-04-09 | DRG: 315 | Disposition: A | Discharge status: Home / Self Care | Payer: Medicare Other | Attending: Internal Medicine | Admitting: Internal Medicine

## 2016-04-06 ENCOUNTER — Ambulatory Visit
Admission: RE | Admit: 2016-04-06 | Discharge: 2016-04-06 | Disposition: A | Discharge status: Home / Self Care | Payer: Medicare Other | Source: Ambulatory Visit | Attending: Internal Medicine | Admitting: Internal Medicine

## 2016-04-06 ENCOUNTER — Inpatient Hospital Stay: Payer: Medicare Other

## 2016-04-06 ENCOUNTER — Other Ambulatory Visit: Payer: Self-pay | Admitting: Internal Medicine

## 2016-04-06 ENCOUNTER — Telehealth: Payer: Self-pay | Admitting: *Deleted

## 2016-04-06 ENCOUNTER — Encounter: Payer: Self-pay | Admitting: Emergency Medicine

## 2016-04-06 DIAGNOSIS — C3490 Malignant neoplasm of unspecified part of unspecified bronchus or lung: Secondary | ICD-10-CM | POA: Diagnosis present

## 2016-04-06 DIAGNOSIS — Z79899 Other long term (current) drug therapy: Secondary | ICD-10-CM

## 2016-04-06 DIAGNOSIS — Z800 Family history of malignant neoplasm of digestive organs: Secondary | ICD-10-CM | POA: Diagnosis not present

## 2016-04-06 DIAGNOSIS — E870 Hyperosmolality and hypernatremia: Secondary | ICD-10-CM | POA: Diagnosis present

## 2016-04-06 DIAGNOSIS — I252 Old myocardial infarction: Secondary | ICD-10-CM

## 2016-04-06 DIAGNOSIS — D649 Anemia, unspecified: Secondary | ICD-10-CM | POA: Diagnosis present

## 2016-04-06 DIAGNOSIS — I82C11 Acute embolism and thrombosis of right internal jugular vein: Secondary | ICD-10-CM | POA: Diagnosis not present

## 2016-04-06 DIAGNOSIS — F17210 Nicotine dependence, cigarettes, uncomplicated: Secondary | ICD-10-CM | POA: Diagnosis present

## 2016-04-06 DIAGNOSIS — I82890 Acute embolism and thrombosis of other specified veins: Secondary | ICD-10-CM | POA: Diagnosis present

## 2016-04-06 DIAGNOSIS — T82868A Thrombosis of vascular prosthetic devices, implants and grafts, initial encounter: Principal | ICD-10-CM | POA: Diagnosis present

## 2016-04-06 DIAGNOSIS — R221 Localized swelling, mass and lump, neck: Secondary | ICD-10-CM

## 2016-04-06 DIAGNOSIS — Y929 Unspecified place or not applicable: Secondary | ICD-10-CM

## 2016-04-06 DIAGNOSIS — Z7984 Long term (current) use of oral hypoglycemic drugs: Secondary | ICD-10-CM

## 2016-04-06 DIAGNOSIS — I808 Phlebitis and thrombophlebitis of other sites: Secondary | ICD-10-CM | POA: Diagnosis present

## 2016-04-06 DIAGNOSIS — J449 Chronic obstructive pulmonary disease, unspecified: Secondary | ICD-10-CM | POA: Diagnosis present

## 2016-04-06 DIAGNOSIS — M542 Cervicalgia: Secondary | ICD-10-CM

## 2016-04-06 DIAGNOSIS — R220 Localized swelling, mass and lump, head: Secondary | ICD-10-CM

## 2016-04-06 DIAGNOSIS — E119 Type 2 diabetes mellitus without complications: Secondary | ICD-10-CM | POA: Diagnosis present

## 2016-04-06 DIAGNOSIS — R1310 Dysphagia, unspecified: Secondary | ICD-10-CM | POA: Diagnosis present

## 2016-04-06 DIAGNOSIS — E860 Dehydration: Secondary | ICD-10-CM | POA: Diagnosis present

## 2016-04-06 DIAGNOSIS — Z923 Personal history of irradiation: Secondary | ICD-10-CM

## 2016-04-06 DIAGNOSIS — L03221 Cellulitis of neck: Secondary | ICD-10-CM | POA: Diagnosis present

## 2016-04-06 DIAGNOSIS — C3412 Malignant neoplasm of upper lobe, left bronchus or lung: Secondary | ICD-10-CM | POA: Diagnosis not present

## 2016-04-06 DIAGNOSIS — E871 Hypo-osmolality and hyponatremia: Secondary | ICD-10-CM | POA: Diagnosis present

## 2016-04-06 DIAGNOSIS — I2510 Atherosclerotic heart disease of native coronary artery without angina pectoris: Secondary | ICD-10-CM | POA: Diagnosis present

## 2016-04-06 DIAGNOSIS — I82C19 Acute embolism and thrombosis of unspecified internal jugular vein: Secondary | ICD-10-CM

## 2016-04-06 DIAGNOSIS — Z880 Allergy status to penicillin: Secondary | ICD-10-CM | POA: Diagnosis not present

## 2016-04-06 DIAGNOSIS — Z9221 Personal history of antineoplastic chemotherapy: Secondary | ICD-10-CM

## 2016-04-06 DIAGNOSIS — I10 Essential (primary) hypertension: Secondary | ICD-10-CM | POA: Diagnosis present

## 2016-04-06 LAB — CBC WITH DIFFERENTIAL/PLATELET
BASOS ABS: 0 10*3/uL (ref 0–0.1)
BASOS PCT: 1 %
Eosinophils Absolute: 0.1 10*3/uL (ref 0–0.7)
Eosinophils Relative: 1 %
HEMATOCRIT: 37.1 % — AB (ref 40.0–52.0)
Hemoglobin: 12.3 g/dL — ABNORMAL LOW (ref 13.0–18.0)
LYMPHS PCT: 11 %
Lymphs Abs: 0.9 10*3/uL — ABNORMAL LOW (ref 1.0–3.6)
MCH: 28 pg (ref 26.0–34.0)
MCHC: 33.2 g/dL (ref 32.0–36.0)
MCV: 84.4 fL (ref 80.0–100.0)
MONO ABS: 1.1 10*3/uL — AB (ref 0.2–1.0)
Monocytes Relative: 14 %
NEUTROS ABS: 5.7 10*3/uL (ref 1.4–6.5)
Neutrophils Relative %: 73 %
PLATELETS: 283 10*3/uL (ref 150–440)
RBC: 4.39 MIL/uL — AB (ref 4.40–5.90)
RDW: 16.6 % — AB (ref 11.5–14.5)
WBC: 7.8 10*3/uL (ref 3.8–10.6)

## 2016-04-06 LAB — PROTIME-INR
INR: 1.09
PROTHROMBIN TIME: 14.3 s (ref 11.4–15.0)

## 2016-04-06 LAB — BASIC METABOLIC PANEL
ANION GAP: 9 (ref 5–15)
BUN: 11 mg/dL (ref 6–20)
CALCIUM: 9.2 mg/dL (ref 8.9–10.3)
CO2: 30 mmol/L (ref 22–32)
CREATININE: 0.84 mg/dL (ref 0.61–1.24)
Chloride: 94 mmol/L — ABNORMAL LOW (ref 101–111)
Glucose, Bld: 130 mg/dL — ABNORMAL HIGH (ref 65–99)
Potassium: 4.4 mmol/L (ref 3.5–5.1)
SODIUM: 133 mmol/L — AB (ref 135–145)

## 2016-04-06 LAB — APTT: APTT: 35 s (ref 24–36)

## 2016-04-06 LAB — GLUCOSE, CAPILLARY: GLUCOSE-CAPILLARY: 161 mg/dL — AB (ref 65–99)

## 2016-04-06 MED ORDER — SENNA 8.6 MG PO TABS
1.0000 | ORAL_TABLET | Freq: Two times a day (BID) | ORAL | Status: DC
  Administered 2016-04-06 – 2016-04-09 (×4): 8.6 mg via ORAL
  Filled 2016-04-06 (×5): qty 1

## 2016-04-06 MED ORDER — INSULIN ASPART 100 UNIT/ML ~~LOC~~ SOLN
4.0000 [IU] | Freq: Three times a day (TID) | SUBCUTANEOUS | Status: DC
  Administered 2016-04-08 – 2016-04-09 (×5): 4 [IU] via SUBCUTANEOUS
  Filled 2016-04-06 (×6): qty 4

## 2016-04-06 MED ORDER — HEPARIN (PORCINE) IN NACL 100-0.45 UNIT/ML-% IJ SOLN
1500.0000 [IU]/h | INTRAMUSCULAR | Status: DC
  Administered 2016-04-06: 1350 [IU]/h via INTRAVENOUS
  Administered 2016-04-07 – 2016-04-08 (×3): 1500 [IU]/h via INTRAVENOUS
  Filled 2016-04-06 (×7): qty 250

## 2016-04-06 MED ORDER — HEPARIN SODIUM (PORCINE) 5000 UNIT/ML IJ SOLN: 4000.0000 [IU] | Freq: Once | INTRAMUSCULAR | Status: DC

## 2016-04-06 MED ORDER — ONDANSETRON HCL 4 MG PO TABS: 8.0000 mg | ORAL_TABLET | Freq: Three times a day (TID) | ORAL | Status: DC | PRN

## 2016-04-06 MED ORDER — VANCOMYCIN HCL 10 G IV SOLR
1250.0000 mg | Freq: Two times a day (BID) | INTRAVENOUS | Status: DC
  Administered 2016-04-07 – 2016-04-08 (×3): 1250 mg via INTRAVENOUS
  Filled 2016-04-06 (×4): qty 1250

## 2016-04-06 MED ORDER — ALBUTEROL SULFATE (2.5 MG/3ML) 0.083% IN NEBU
3.0000 mL | INHALATION_SOLUTION | Freq: Four times a day (QID) | RESPIRATORY_TRACT | Status: DC | PRN
  Administered 2016-04-07: 18:00:00 3 mL via RESPIRATORY_TRACT
  Filled 2016-04-06: qty 3

## 2016-04-06 MED ORDER — VANCOMYCIN HCL 10 G IV SOLR
1250.0000 mg | Freq: Once | INTRAVENOUS | Status: AC
  Administered 2016-04-06: 1250 mg via INTRAVENOUS
  Filled 2016-04-06: qty 1250

## 2016-04-06 MED ORDER — SODIUM CHLORIDE 0.9 % IV SOLN
INTRAVENOUS | Status: DC
  Administered 2016-04-06 – 2016-04-07 (×2): via INTRAVENOUS

## 2016-04-06 MED ORDER — HEPARIN BOLUS VIA INFUSION
4200.0000 [IU] | Freq: Once | INTRAVENOUS | Status: AC
  Administered 2016-04-06: 4200 [IU] via INTRAVENOUS
  Filled 2016-04-06: qty 4200

## 2016-04-06 MED ORDER — METFORMIN HCL 500 MG PO TABS
1000.0000 mg | ORAL_TABLET | Freq: Two times a day (BID) | ORAL | Status: DC
  Administered 2016-04-07 – 2016-04-09 (×4): 1000 mg via ORAL
  Filled 2016-04-06 (×5): qty 2

## 2016-04-06 MED ORDER — ACETAMINOPHEN 325 MG PO TABS
650.0000 mg | ORAL_TABLET | Freq: Four times a day (QID) | ORAL | Status: DC | PRN
  Administered 2016-04-07 (×2): 650 mg via ORAL
  Filled 2016-04-06 (×2): qty 2

## 2016-04-06 MED ORDER — MAGIC MOUTHWASH
5.0000 mL | Freq: Four times a day (QID) | ORAL | Status: DC | PRN
  Filled 2016-04-06: qty 5

## 2016-04-06 MED ORDER — PROCHLORPERAZINE MALEATE 10 MG PO TABS: 10.0000 mg | ORAL_TABLET | Freq: Four times a day (QID) | ORAL | Status: DC | PRN

## 2016-04-06 MED ORDER — ALBUTEROL SULFATE HFA 108 (90 BASE) MCG/ACT IN AERS: 2.0000 | INHALATION_SPRAY | Freq: Four times a day (QID) | RESPIRATORY_TRACT | Status: DC | PRN

## 2016-04-06 MED ORDER — DOCUSATE SODIUM 100 MG PO CAPS
100.0000 mg | ORAL_CAPSULE | Freq: Two times a day (BID) | ORAL | Status: DC
  Administered 2016-04-06 – 2016-04-09 (×4): 100 mg via ORAL
  Filled 2016-04-06 (×4): qty 1

## 2016-04-06 MED ORDER — ALBUTEROL SULFATE (2.5 MG/3ML) 0.083% IN NEBU
2.5000 mg | INHALATION_SOLUTION | Freq: Four times a day (QID) | RESPIRATORY_TRACT | Status: DC
  Administered 2016-04-07 – 2016-04-08 (×7): 2.5 mg via RESPIRATORY_TRACT
  Filled 2016-04-06 (×7): qty 3

## 2016-04-06 MED ORDER — INSULIN ASPART 100 UNIT/ML ~~LOC~~ SOLN: 0.0000 [IU] | Freq: Every day | SUBCUTANEOUS | Status: DC

## 2016-04-06 MED ORDER — INSULIN ASPART 100 UNIT/ML ~~LOC~~ SOLN
0.0000 [IU] | Freq: Three times a day (TID) | SUBCUTANEOUS | Status: DC
Start: 2016-04-07 — End: 2016-04-09
  Administered 2016-04-08: 3 [IU] via SUBCUTANEOUS
  Administered 2016-04-08 – 2016-04-09 (×3): 1 [IU] via SUBCUTANEOUS
  Filled 2016-04-06 (×2): qty 1
  Filled 2016-04-06: qty 3
  Filled 2016-04-06: qty 5
  Filled 2016-04-06: qty 1
  Filled 2016-04-06: qty 3

## 2016-04-06 MED ORDER — HYDROCODONE-ACETAMINOPHEN 5-325 MG PO TABS
1.0000 | ORAL_TABLET | ORAL | Status: DC | PRN
  Administered 2016-04-08 (×2): 1 via ORAL
  Administered 2016-04-08 – 2016-04-09 (×2): 2 via ORAL
  Filled 2016-04-06 (×2): qty 1
  Filled 2016-04-06 (×2): qty 2

## 2016-04-06 MED ORDER — ACETAMINOPHEN 650 MG RE SUPP: 650.0000 mg | Freq: Four times a day (QID) | RECTAL | Status: DC | PRN

## 2016-04-06 MED ORDER — IOPAMIDOL (ISOVUE-300) INJECTION 61%
75.0000 mL | Freq: Once | INTRAVENOUS | Status: AC | PRN
  Administered 2016-04-06: 75 mL via INTRAVENOUS

## 2016-04-06 NOTE — H&P (Addendum)
[facility] [place] NAME: Richard Jimenez    MR#:  [id number]  DATE OF BIRTH:  10/31/1940  DATE OF ADMISSION:  04/06/2016  PRIMARY CARE PHYSICIAN: Madelyn Brunner, MD   REQUESTING/REFERRING PHYSICIAN:   CHIEF COMPLAINT:   Chief Complaint  Patient presents with  . Neck Pain    HISTORY OF PRESENT ILLNESS: [place] Cordts  is a 75 y.o. male with a known history of Small cell lung carcinoma in upper lobe, coronary artery disease, status post MI, COPD, hypertension, diabetes mellitus type 2, who presents to the hospital with complaints of right neck and head pain and swelling, difficulty swallowing due to significant pain and swelling. Apparently patient was noted to have right neck swelling approximately one half weeks ago which progressively got worse. It was also painful in the chest area at port site, which is also swollen. Patient was initiated on antibiotic therapy, he underwent CT of soft tissues of the neck which revealed right IJ vein thrombosis, patient was referred to emergency room for admission and heparin IV initiation by oncologist. Patient admits of significant swelling in the right neck and pain involving his entire head on the right side. Patient admits of having fevers to around 100s over the past one to 2 days, chills, he was initiated on antibiotic therapy with Keflex by primary oncologist 2 days ago. Hospitalist services were contacted for admission  PAST MEDICAL HISTORY:   Past Medical History  Diagnosis Date  . Myocardial infarct ([place])   . COPD (chronic obstructive pulmonary disease) ([place])   . Hypertension   . Diabetes mellitus without complication ([place])   . Asthma   . Lung cancer, upper lobe ([place]) 2009    PAST SURGICAL HISTORY: Past Surgical History  Procedure Laterality Date  . Throat surgery    . Tumor removal Right     "years ago-right lower posterior side"  . Peripheral vascular catheterization N/A 02/21/2016     Procedure: Glori Luis Cath Insertion;  Surgeon: Algernon Huxley, MD;  Location: [facility];  Service: Cardiovascular;  Laterality: N/A;    SOCIAL HISTORY:  Social History  Substance Use Topics  . Smoking status: Current Every Day Smoker -- 1.00 packs/day for 62 years    Types: Cigarettes  . Smokeless tobacco: Not on file  . Alcohol Use: 3.6 oz/week    6 Cans of beer per week     Comment: rarely    FAMILY HISTORY:  Family History  Problem Relation Age of Onset  . Cancer - Colon Father   . Cancer - Colon Sister     DRUG ALLERGIES:  Allergies  Allergen Reactions  . Clarithromycin Nausea And Vomiting  . Penicillins Nausea And Vomiting, Rash and Other (See Comments)    Has patient had a PCN reaction causing immediate rash, facial/tongue/throat swelling, SOB or lightheadedness with hypotension: No Has patient had a PCN reaction causing severe rash involving mucus membranes or skin necrosis: No Has patient had a PCN reaction that required hospitalization No Has patient had a PCN reaction occurring within the last 10 years: No If all of the above answers are "NO", then may proceed with Cephalosporin use.    Review of Systems  Constitutional: Positive for fever, chills and weight loss. Negative for malaise/fatigue.  HENT: Positive for ear pain and sore throat. Negative for congestion.   Eyes: Negative for blurred vision and double vision.  Respiratory: Positive for cough, sputum production, shortness of  breath and wheezing.   Cardiovascular: Positive for chest pain. Negative for palpitations, orthopnea, leg swelling and PND.  Gastrointestinal: Positive for constipation. Negative for nausea, vomiting, abdominal pain, diarrhea, blood in stool and melena.  Genitourinary: Negative for dysuria, urgency, frequency and hematuria.  Musculoskeletal: Negative for falls.  Skin: Negative for rash.  Neurological: Positive for weakness and headaches. Negative for dizziness.   Psychiatric/Behavioral: Negative for depression and memory loss. The patient is not nervous/anxious.     MEDICATIONS AT HOME:  Prior to Admission medications   Medication Sig Start Date End Date Taking? Authorizing Provider  albuterol (PROAIR HFA) 108 (90 Base) MCG/ACT inhaler Inhale 2 puffs into the lungs every 6 (six) hours as needed for wheezing or shortness of breath.    Yes Historical Provider, MD  albuterol (PROVENTIL) (2.5 MG/3ML) 0.083% nebulizer solution Inhale 3 mLs into the lungs every 6 (six) hours as needed for wheezing or shortness of breath.    Yes Historical Provider, MD  cephALEXin (KEFLEX) 500 MG capsule Take 1 capsule (500 mg total) by mouth 3 (three) times daily. 04/04/16  Yes Cammie Sickle, MD  lidocaine-prilocaine (EMLA) cream Apply 1 application topically as needed (prior to accessing port).   Yes Historical Provider, MD  magic mouthwash SOLN Take 5 mLs by mouth 4 (four) times daily as needed for mouth pain. 04/04/16  Yes Cammie Sickle, MD  metFORMIN (GLUCOPHAGE) 1000 MG tablet Take 1,000 mg by mouth 2 (two) times daily with a meal.    Yes Historical Provider, MD  ondansetron (ZOFRAN) 8 MG tablet Take 8 mg by mouth every 8 (eight) hours as needed for nausea or vomiting.   Yes Historical Provider, MD  prochlorperazine (COMPAZINE) 10 MG tablet Take 1 tablet (10 mg total) by mouth every 6 (six) hours as needed for nausea or vomiting. 02/07/16  Yes Cammie Sickle, MD  traMADol (ULTRAM) 50 MG tablet Take 50 mg by mouth every 6 (six) hours as needed for moderate pain.    Yes Historical Provider, MD      PHYSICAL EXAMINATION:   VITAL SIGNS: Blood pressure 143/69, pulse 75, temperature 98.4 F (36.9 C), temperature source Oral, resp. rate 23, height '5\' 9"'$  (1.753 m), weight 83.915 kg (185 lb), SpO2 96 %.  GENERAL:  75 y.o.-year-old patient lying in the bed with no acute distress. Right-sided neck is swollen and tender to palpation, including right scalp, right  upper chest at around the port area EYES: Pupils equal, round, reactive to light and accommodation. No scleral icterus. Extraocular muscles intact.  HEENT: Head atraumatic, normocephalic. Oropharynx and nasopharynx clear.  NECK:  Supple, no jugular venous distention. Swollen on the right, tender to palpation. No thyroid enlargement LUNGS: Diminished breath sounds bilaterally, diffuse scattered wheezing, no rales,rhonchi or crepitationS. No use of accessory muscles of respiration.  CARDIOVASCULAR: S1, S2 normal. No murmurs, rubs, or gallops.  ABDOMEN: Soft, nontender, nondistended. Bowel sounds present. No organomegaly or mass.  EXTREMITIES: No pedal edema, cyanosis, or clubbing.  NEUROLOGIC: Cranial nerves II through XII are intact. Muscle strength 5/5 in all extremities. Sensation intact. Gait not checked.  PSYCHIATRIC: The patient is alert and oriented x 3.  SKIN: No obvious rash, lesion, or ulcer.   LABORATORY PANEL:   CBC  Recent Labs Lab 04/04/16 0837 04/06/16 1800  WBC 7.6 7.8  HGB 13.3 12.3*  HCT 38.6* 37.1*  PLT 240 283  MCV 83.6 84.4  MCH 28.9 28.0  MCHC 34.6 33.2  RDW 16.3* 16.6*  LYMPHSABS  1.1 0.9*  MONOABS 1.6* 1.1*  EOSABS 0.0 0.1  BASOSABS 0.1 0.0   ------------------------------------------------------------------------------------------------------------------  Chemistries   Recent Labs Lab 04/04/16 0837 04/06/16 1800  NA 131* 133*  K 4.4 4.4  CL 93* 94*  CO2 30 30  GLUCOSE 141* 130*  BUN 10 11  CREATININE 0.83 0.84  CALCIUM 8.9 9.2  AST 14*  --   ALT 12*  --   ALKPHOS 50  --   BILITOT 0.8  --    ------------------------------------------------------------------------------------------------------------------  Cardiac Enzymes No results for input(s): TROPONINI in the last 168 hours. ------------------------------------------------------------------------------------------------------------------  RADIOLOGY: Ct Soft Tissue Neck W  Contrast  04/06/2016  CLINICAL DATA:  75 year old male with right neck pain and swelling since 04/03/2016. Lung cancer post chemotherapy. Left vocal cord surgery 4 months ago. Initial encounter. EXAM: CT NECK WITH CONTRAST TECHNIQUE: Multidetector CT imaging of the neck was performed using the standard protocol following the bolus administration of intravenous contrast. CONTRAST:  41m ISOVUE-300 IOPAMIDOL (ISOVUE-300) INJECTION 61% COMPARISON:  07/19/2011 neck CT. 01/17/2016 brain MR. 01/17/2016 PET CT. FINDINGS: Pharynx and larynx: Mild impression upon the right aspect of the palatine tonsil/ pharynx by the inflamed enlarged right internal jugular vein. Prominence of the palatine tonsils and lingual tonsils without definitive mass identified. Marked narrowing supraglottic airway. Teflon left vocal cord. Heterogeneous appearance of the thyroid cartilage similar to prior exam. Salivary glands: No primary worrisome mass. Thyroid: No worrisome dominant mass. Lymph nodes: Interval enlargement of lymph node posterior right submandibular gland immediately anterior to the thrombosed inflamed right internal jugular vein may be reactive in origin. Attention to this on follow-up. Previously noted left level 4 and level 5 malignant adenopathy has decreased in size now measuring up to 1.5 cm versus prior 2.5 cm. Vascular: New from the 01/17/2016 brain MR is thrombosis of the right internal jugular vein. This extends from the level of the skull base (right jugular fossa) throughout the neck to level of the right innominate vein. A right central port is in place entering the thrombosed right internal jugular vein with the tip not imaged on the current exam. The thrombosed right internal jugular vein is enlarged and inflamed with adjacent reactive changes. Right carotid artery displaced by a thrombosed right internal jugular vein. Prominent plaque right carotid bifurcation/ proximal right internal carotid artery with 66%  diameter stenosis proximal right internal carotid artery and moderate narrowing just below the skullbase. Moderate narrowing left common carotid artery. Left carotid bifurcation calcifications with less than 60% diameter stenosis. Aortic atherosclerosis. Limited intracranial: No enhancing lesion. Visualized orbits: Negative. Mastoids and visualized paranasal sinuses: Clear. 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 Upper chest: Left upper lobe consolidation consistent with post therapy changes. The previously noted nodular components along the anterior and posterior aspect of the left thorax are less well delineated on current exam however, right upper lobe nodular density has enlarged slightly now with maximal transverse dimension of 1.6 cm versus prior 1.3 cm. 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 Port enters this thrombosed vessel. The vessel is enlarged and inflamed. Patient is at risk for right sigmoid sinus thrombosis. Decrease size of left level 4 and 5 lymph nodes and nodularity left upper lung. Slight increase in size of right nodularity (current 1.6 cm versus prior 1.3 cm). New enlarged lymph node posterior right submandibular gland may be reactive in origin given the adjacent thrombosed  vessel rather than metastatic in origin. Attention to this on follow-up. Aortic atherosclerosis 66% diameter stenosis proximal right internal carotid artery and moderate narrowing just below the skullbase. Moderate narrowing left common carotid artery. Left carotid bifurcation calcifications with less than 60% diameter stenosis. These results were called by telephone at the time of interpretation on 04/06/2016 at 4:34 pm to Dr. Charlaine Dalton , who verbally acknowledged these results. Electronically Signed   By: Genia Del M.D.   On: 04/06/2016 16:59     EKG: Orders placed or performed during the hospital encounter of 12/28/15  . EKG 12-Lead  . EKG 12-Lead  . ED EKG within 10 minutes  . ED EKG within 10 minutes  . EKG  EKG reveals sinus rhythm at 84 bpm, normal axis, possible atypical left bundle branch block per EKG criteria, nonspecific ST-T changes  IMPRESSION AND PLAN:  Principal Problem:   Internal jugular vein thrombosis (HCC) Active Problems:   Neck pain   Hyponatremia   Anemia #1 internal jugular vein thrombosis on the right due to port, ? infected, admit patient to medical floor, initiate him on IV heparin, vancomycin, after an blood cultures are drawn 2 #2. Neck pain due to right internal jugular vein thrombosis, follow clinically on heparin and antibiotics #3. Hyponatremia, patient admits of poor oral intake recently due to dysphagia,suspected dehydration,  initiate patient on IV fluids, follow sodium level in the morning.  #4 anemia, get Hemoccult, follow with therapy #5. COPD, initiate patient on nebs every 6 hours   All the records are reviewed and case discussed with ED provider. Management plans discussed with the patient, family and they are in agreement.  CODE STATUS: Code Status History    This patient does not have a recorded code status. Please follow your organizational policy for patients in this situation.       TOTAL TIME TAKING CARE OF THIS PATIENT: 50 minutes.    Theodoro Grist M.D on 04/06/2016 at 8:32 PM  Between 7am to 6pm - Pager - [phone] After 6pm go to [email or web address] - password [email or web address] [facility]  Office  [phone]  CC: Primary care physician; Madelyn Brunner, MD

## 2016-04-06 NOTE — Consult Note (Signed)
Consult Note  Patient name: Richard Jimenez MRN: [id number] DOB: March 12, 1941 Sex: male  Consulting Physician:  ER  Reason for Consult:  Chief Complaint  Patient presents with  . Neck Pain    HISTORY OF PRESENT ILLNESS: This is a 75 year old gentleman with history of small cell lung cancer.  He had a port placed by Dr. do approximate 1 month ago.  For the past week he has been complaining of right neck swelling that has gotten worse.  In addition he is complaining of pain over his port.  He recently underwent a CT scan which revealed a DVT within the right internal jugular vein.  He has been started on heparin in the emergency department.  He is also been on antibiotics for temperature around 100 over the past couple of days.  Past Medical History  Diagnosis Date  . Myocardial infarct ([place])   . COPD (chronic obstructive pulmonary disease) ([place])   . Hypertension   . Diabetes mellitus without complication ([place])   . Asthma   . Lung cancer, upper lobe ([place]) 2009    Past Surgical History  Procedure Laterality Date  . Throat surgery    . Tumor removal Right     "years ago-right lower posterior side"  . Peripheral vascular catheterization N/A 02/21/2016    Procedure: Glori Luis Cath Insertion;  Surgeon: Algernon Huxley, MD;  Location: [facility];  Service: Cardiovascular;  Laterality: N/A;    Social History   Social History  . Marital Status: Married    Spouse Name: N/A  . Number of Children: N/A  . Years of Education: N/A   Occupational History  . Not on file.   Social History Main Topics  . Smoking status: Current Every Day Smoker -- 1.00 packs/day for 62 years    Types: Cigarettes  . Smokeless tobacco: Not on file  . Alcohol Use: 3.6 oz/week    6 Cans of beer per week     Comment: rarely  . Drug Use: No  . Sexual Activity: Not on file   Other Topics Concern  . Not on file   Social History Narrative    Family History  Problem Relation Age of Onset  .  Cancer - Colon Father   . Cancer - Colon Sister     Allergies as of 04/06/2016 - Review Complete 04/06/2016  Allergen Reaction Noted  . Clarithromycin Nausea And Vomiting 05/06/2014  . Penicillins Nausea And Vomiting, Rash, and Other (See Comments) 05/06/2014    No current facility-administered medications on file prior to encounter.   Current Outpatient Prescriptions on File Prior to Encounter  Medication Sig Dispense Refill  . albuterol (PROAIR HFA) 108 (90 Base) MCG/ACT inhaler Inhale 2 puffs into the lungs every 6 (six) hours as needed for wheezing or shortness of breath.     Marland Kitchen albuterol (PROVENTIL) (2.5 MG/3ML) 0.083% nebulizer solution Inhale 3 mLs into the lungs every 6 (six) hours as needed for wheezing or shortness of breath.     . cephALEXin (KEFLEX) 500 MG capsule Take 1 capsule (500 mg total) by mouth 3 (three) times daily. 30 capsule 0  . magic mouthwash SOLN Take 5 mLs by mouth 4 (four) times daily as needed for mouth pain. 200 mL 3  . metFORMIN (GLUCOPHAGE) 1000 MG tablet Take 1,000 mg by mouth 2 (two) times daily with a meal.     . prochlorperazine (COMPAZINE) 10 MG tablet Take 1 tablet (10  mg total) by mouth every 6 (six) hours as needed for nausea or vomiting. 30 tablet 0  . traMADol (ULTRAM) 50 MG tablet Take 50 mg by mouth every 6 (six) hours as needed for moderate pain.   3     REVIEW OF SYSTEMS: Constitutional: Positive for fever, chills and weight loss. Negative for malaise/fatigue.  HENT: Positive for ear pain and sore throat. Negative for congestion.  Eyes: Negative for blurred vision and double vision.  Respiratory: Positive for cough, sputum production, shortness of breath and wheezing.  Cardiovascular: Positive for chest pain. Negative for palpitations, orthopnea, leg swelling and PND.  Gastrointestinal: Positive for constipation. Negative for nausea, vomiting, abdominal pain, diarrhea, blood in stool and melena.  Genitourinary: Negative for dysuria,  urgency, frequency and hematuria.  Musculoskeletal: Negative for falls.  Skin: Negative for rash.  Neurological: Positive for weakness and headaches. Negative for dizziness.  Psychiatric/Behavioral: Negative for depression and memory loss. The patient is not nervous/anxious.   PHYSICAL EXAMINATION: General: The patient appears their stated age.  Vital signs are BP 136/65 mmHg  Pulse 80  Temp(Src) 98.4 F (36.9 C) (Oral)  Resp 24  Ht '5\' 9"'$  (1.753 m)  Wt 185 lb (83.915 kg)  BMI 27.31 kg/m2  SpO2 94% Pulmonary: Respirations are non-labored HEENT:  No gross abnormalities Musculoskeletal: There are no major deformities.   Neurologic: No focal weakness or paresthesias are detected, Skin: There are no ulcer or rashes noted. Psychiatric: The patient has normal affect. Cardiovascular: There is a regular rate and rhythm without significant murmur appreciated.  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 Port enters this thrombosed vessel. The vessel is enlarged and inflamed. Patient is at risk for right sigmoid sinus thrombosis    Assessment:  Right internal jugular vein DVT, status post Port-A-Cath Plan: I discussed with the patient that the treatment for this needs to be anticoagulation as well as removal of his Port-A-Cath.  I discussed the details of the procedure with the patient.  We will plan to do this tomorrow.  He'll be nothing by mouth after midnight.  He will continue his IV heparin.  This can be converted to oral agents after his operation.     Eldridge Abrahams, M.D. [facility] Office: [phone] Pager:  [phone]

## 2016-04-06 NOTE — Telephone Encounter (Signed)
Per D rB, CT neck is being ordered stat. Wife informed she will be getting a call with time and date

## 2016-04-06 NOTE — ED Provider Notes (Signed)
[facility] Emergency Department Provider Note   ____________________________________________  Time seen: ~1910  I have reviewed the triage vital signs and the nursing notes.   HISTORY  Chief Complaint Neck Pain   History limited by: Not Limited   HPI Richard Jimenez is a 75 y.o. male who presents to the emergency department today because of findings from outpatient CT scan.The patient got the CT scan because of the neck pain. He has been having neck pain for about a week. Initially said it presented as a sore throat however then started becoming right-sided neck pain. Discussed with his oncologist 2 days ago who then ordered the CT scan. CT scan does show that it involves the port catheter. Patient denies any fevers. Denies any new chest pain shortness of breath or cough.    Past Medical History  Diagnosis Date  . Myocardial infarct ([place])   . COPD (chronic obstructive pulmonary disease) ([place])   . Hypertension   . Diabetes mellitus without complication ([place])   . Asthma   . Lung cancer, upper lobe [facility]) 2009    Patient Active Problem List   Diagnosis Date Noted  . Dehydration 04/04/2016  . Dysphagia 04/04/2016  . Small cell lung cancer ([place]) 02/06/2016  . Type 2 diabetes mellitus ([place]) 07/08/2015  . Lung cancer, upper lobe ([place]) 03/29/2015  . Chronic obstructive pulmonary disease ([place]) 04/21/2012  . Breath shortness 04/21/2012  . H/O malignant neoplasm 04/21/2012  . H/O acute myocardial infarction 04/21/2012  . Arthritis, degenerative 04/21/2012  . BP (high blood pressure) 04/21/2012  . 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 Throat surgery    . Tumor removal Right     "years ago-right lower posterior side"  . Peripheral vascular catheterization N/A 02/21/2016    Procedure: Glori Luis Cath Insertion;  Surgeon: Algernon Huxley, MD;  Location: [facility];  Service: Cardiovascular;   Laterality: N/A;    Current Outpatient Rx  Name  Route  Sig  Dispense  Refill  . albuterol (PROAIR HFA) 108 (90 Base) MCG/ACT inhaler   Inhalation   Inhale 2 puffs into the lungs every 6 (six) hours as needed.         Marland Kitchen albuterol (PROVENTIL) (2.5 MG/3ML) 0.083% nebulizer solution   Inhalation   Inhale 3 mLs into the lungs every 12 (twelve) hours as needed.         . cephALEXin (KEFLEX) 500 MG capsule   Oral   Take 1 capsule (500 mg total) by mouth 3 (three) times daily.   30 capsule   0   . glucose blood test strip               . lidocaine-prilocaine (EMLA) cream   Topical   Apply 1 application topically as needed.   30 g   6   . magic mouthwash SOLN   Oral   Take 5 mLs by mouth 4 (four) times daily as needed for mouth pain.   200 mL   3   . metFORMIN (GLUCOPHAGE) 1000 MG tablet   Oral   Take 1,000 mg by mouth 2 (two) times daily. Reported on 02/22/2016         . ondansetron (ZOFRAN) 8 MG tablet   Oral   Take 1 tablet (8 mg total) by mouth every 8 (eight) hours as needed for nausea or vomiting (start 3 days; after chemo).   40 tablet  0   . prochlorperazine (COMPAZINE) 10 MG tablet   Oral   Take 1 tablet (10 mg total) by mouth every 6 (six) hours as needed for nausea or vomiting.   30 tablet   0   . Shark Cartilage 500 MG CAPS   Oral   Take by mouth. Reported on 03/14/2016         . sucralfate (CARAFATE) 1 g tablet   Oral   Take 1 tablet (1 g total) by mouth 3 (three) times daily before meals. Dissolve tablets in 4 tablespoons of warm water; swish and swallow   90 tablet   3   . traMADol (ULTRAM) 50 MG tablet   Oral   Take 50 mg by mouth every 6 (six) hours as needed. for pain      3     Allergies Clarithromycin and Penicillins  Family History  Problem Relation Age of Onset  . Cancer - Colon Father   . Cancer - Colon Sister     Social History Social History  Substance Use Topics  . Smoking status: Current Every Day Smoker --  1.00 packs/day for 62 years    Types: Cigarettes  . Smokeless tobacco: None  . Alcohol Use: 3.6 oz/week    6 Cans of beer per week     Comment: rarely    Review of Systems  Constitutional: Negative for fever. Cardiovascular: Negative for chest pain. Respiratory: Negative for shortness of breath. Gastrointestinal: Negative for abdominal pain, vomiting and diarrhea. Neurological: Negative for headaches, focal weakness or numbness.  10-point ROS otherwise negative.  ____________________________________________   PHYSICAL EXAM:  VITAL SIGNS: ED Triage Vitals  Enc Vitals Group     BP 04/06/16 1732 102/79 mmHg     Pulse Rate 04/06/16 1732 84     Resp 04/06/16 1732 18     Temp 04/06/16 1732 98.4 F (36.9 C)     Temp Source 04/06/16 1732 Oral     SpO2 04/06/16 1732 99 %     Weight 04/06/16 1732 185 lb (83.915 kg)     Height 04/06/16 1732 '5\' 9"'$  (1.753 m)     Head Cir --      Peak Flow --      Pain Score 04/06/16 1732 6   Constitutional: Alert and oriented. Well appearing and in no distress. Eyes: Conjunctivae are normal. PERRL. Normal extraocular movements. ENT   Head: Normocephalic and atraumatic.   Nose: No congestion/rhinnorhea.   Mouth/Throat: Mucous membranes are moist.   Neck: No stridor. Hematological/Lymphatic/Immunilogical: No cervical lymphadenopathy. Cardiovascular: Normal rate, regular rhythm.  No murmurs, rubs, or gallops. Respiratory: Normal respiratory effort without tachypnea nor retractions. Breath sounds are clear and equal bilaterally. No wheezes/rales/rhonchi. Gastrointestinal: Soft and nontender. No distention. Genitourinary: Deferred Musculoskeletal: Normal range of motion in all extremities. No joint effusions.  No lower extremity tenderness nor edema. Neurologic:  Normal speech and language. No gross focal neurologic deficits are appreciated.  Skin:  Skin is warm, dry and intact. No rash noted. Psychiatric: Mood and affect are normal.  Speech and behavior are normal. Patient exhibits appropriate insight and judgment.  ____________________________________________    LABS (pertinent positives/negatives)  Labs Reviewed  CBC WITH DIFFERENTIAL/PLATELET - Abnormal; Notable for the following:    RBC 4.39 (*)    Hemoglobin 12.3 (*)    HCT 37.1 (*)    RDW 16.6 (*)    Lymphs Abs 0.9 (*)    Monocytes Absolute 1.1 (*)    All other components  within normal limits  BASIC METABOLIC PANEL - Abnormal; Notable for the following:    Sodium 133 (*)    Chloride 94 (*)    Glucose, Bld 130 (*)    All other components within normal limits  PROTIME-INR  APTT  HEPARIN LEVEL (UNFRACTIONATED)     ____________________________________________   EKG  I, Nance Pear, attending physician, personally viewed and interpreted this EKG  EKG Time: 1743 Rate: 84 Rhythm: normal sinus rhythm Axis: normal Intervals: qtc 519 QRS: Wide ST changes: no st elevation Impression: abnormal ekg   ____________________________________________    RADIOLOGY  Outpatient CT neck  CLINICAL DATA: 75 year old male with right neck pain and swelling since 04/03/2016. Lung cancer post chemotherapy. Left vocal cord surgery 4 months ago. Initial encounter.  EXAM: CT NECK WITH CONTRAST  TECHNIQUE: Multidetector CT imaging of the neck was performed using the standard protocol following the bolus administration of intravenous contrast.  CONTRAST: 74m ISOVUE-300 IOPAMIDOL (ISOVUE-300) INJECTION 61%  COMPARISON: 07/19/2011 neck CT. 01/17/2016 brain MR. 01/17/2016 PET CT.  FINDINGS: Pharynx and larynx: Mild impression upon the right aspect of the palatine tonsil/ pharynx by the inflamed enlarged right internal jugular vein. Prominence of the palatine tonsils and lingual tonsils without definitive mass identified. Marked narrowing supraglottic airway. Teflon left vocal cord. Heterogeneous appearance of the thyroid cartilage similar  to prior exam.  Salivary glands: No primary worrisome mass.  Thyroid: No worrisome dominant mass.  Lymph nodes: Interval enlargement of lymph node posterior right submandibular gland immediately anterior to the thrombosed inflamed right internal jugular vein may be reactive in origin. Attention to this on follow-up. Previously noted left level 4 and level 5 malignant adenopathy has decreased in size now measuring up to 1.5 cm versus prior 2.5 cm.  Vascular: New from the 01/17/2016 brain MR is thrombosis of the right internal jugular vein. This extends from the level of the skull base (right jugular fossa) throughout the neck to level of the right innominate vein. A right central port is in place entering the thrombosed right internal jugular vein with the tip not imaged on the current exam. The thrombosed right internal jugular vein is enlarged and inflamed with adjacent reactive changes.  Right carotid artery displaced by a thrombosed right internal jugular vein. 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 Moderate narrowing left common carotid artery. Left carotid bifurcation calcifications with less than 60% diameter stenosis. Aortic atherosclerosis.  Limited intracranial: No enhancing lesion.  Visualized orbits: Negative.  Mastoids and visualized paranasal sinuses: Clear.  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  Upper chest: Left upper lobe consolidation consistent with post therapy changes. The previously noted nodular components along the anterior and posterior aspect of the left thorax are less well delineated on current exam however, right upper lobe nodular density has enlarged slightly now with maximal transverse dimension of  1.6 cm versus prior 1.3 cm.  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 Port enters this thrombosed vessel. The vessel is enlarged and inflamed. Patient is at risk for right sigmoid sinus thrombosis.  Decrease size of left level 4 and 5 lymph nodes and nodularity left upper lung. Slight increase in size of right nodularity (current 1.6 cm versus prior 1.3 cm).  New enlarged lymph node posterior right submandibular gland  may be reactive in origin given the adjacent thrombosed vessel rather than metastatic in origin. Attention to this on follow-up.  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 Moderate narrowing left common carotid artery. Left carotid bifurcation calcifications with less than 60% diameter stenosis.      ____________________________________________   PROCEDURES  Procedure(s) performed: None  Critical Care performed: No  ____________________________________________   INITIAL IMPRESSION / ASSESSMENT AND PLAN / ED COURSE  Pertinent labs & imaging results that were available during my care of the patient were reviewed by me and considered in my medical decision making (see chart for details).  Patient presented today after outpatient CT scan shows internal jugular vein thrombosis. This is likely related to port catheter. Will start patient on heparin. I did discuss with vascular surgery on call. They will likely tried to take the patient to the OR to remove this weekend. No leukocytosis or fever to suggest infection. Will admit to the hospital service.  ____________________________________________   FINAL CLINICAL IMPRESSION(S) / ED DIAGNOSES  Final diagnoses:  Jugular vein thrombosis, right     Note: This dictation was prepared with Dragon dictation. Any transcriptional errors  that result from this process are unintentional    Nance Pear, MD 04/06/16 1929

## 2016-04-06 NOTE — Consult Note (Signed)
ANTICOAGULATION CONSULT NOTE - Initial Consult  Pharmacy Consult for heparin drip Indication: VTE treatment- stenosis on carotid scan  Allergies  Allergen Reactions  . Clarithromycin Nausea And Vomiting  . Penicillins Nausea Only    Patient Measurements: Height: '5\' 9"'$  (175.3 cm) Weight: 185 lb (83.915 kg) IBW/kg (Calculated) : 70.7 Heparin Dosing Weight: 83.9kg  Vital Signs: Temp: 98.4 F (36.9 C) (06/30 1732) Temp Source: Oral (06/30 1732) BP: 148/70 mmHg (06/30 1900) Pulse Rate: 76 (06/30 1900)  Labs:  Recent Labs  04/04/16 0837 04/06/16 1800  HGB 13.3 12.3*  HCT 38.6* 37.1*  PLT 240 283  LABPROT  --  14.3  INR  --  1.09  CREATININE 0.83 0.84    Estimated Creatinine Clearance: 77.2 mL/min (by C-G formula based on Cr of 0.84).   Medical History: Past Medical History  Diagnosis Date  . Myocardial infarct ([place])   . COPD (chronic obstructive pulmonary disease) ([place])   . Hypertension   . Diabetes mellitus without complication ([place])   . Asthma   . Lung cancer, upper lobe ([place]) 2009    Medications:  Scheduled:  . 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 Pharmacy consulted to dose heparin drip. Med rec/ care everywhere dose not show any home anticoagulants. Baseline labs ordered.  Goal of Therapy:  Heparin level 0.3-0.7 units/ml Monitor platelets by anticoagulation protocol: Yes   Plan:  Give 4200 units bolus x 1 Start heparin infusion at 1350 units/hr Check anti-Xa level in 6 hours and daily while on heparin Continue to monitor H&H and platelets  Kirsta Probert D Yusra Ravert, Pharm.D Clinical Pharmacist   04/06/2016,7:14 PM

## 2016-04-06 NOTE — ED Notes (Signed)
Pt was told by cancer MD to come directly to ER after pt had scan of neck that looked concerning.  Pt alert and oriented x4, no distress noted at this time.  Pt states MD did not state whether or not an admission would be necessary at this time.

## 2016-04-06 NOTE — Telephone Encounter (Signed)
Patient has a ct scan at 345pm today. Pt requesting to "come to cancer center prior to ct scan apt" "to get 2 pain tablets stronger." Per wife that patient is currently taking tramadol. Last dose taking 1 time yesterday evening.  Pt's wife states that her husband is in extreme pain. He is "afraid" to take the tramadol because it make him very sleepy. Pt's wife states that the tramadol relieves the patient's pain. I asked the patient's wife to proceed to administer with tramadol at this time since patient had an excellent response to this relieving his pain.

## 2016-04-06 NOTE — ED Notes (Addendum)
Patient presents to the ED after a call from his doctor.  Patient had a "scan" 2 hours ago and was told he has a blood clot in his neck.  Patient has had pain to the left side of his neck for the past several days.  Patient is being treated for lung cancer has well.  Patient has been undergoing chemotherapy and radiating and last treatment was 2 weeks ago.  Patient denies any new shortness of breath or chest pain, reports some shortness of breath at baseline due to COPD.

## 2016-04-06 NOTE — ED Notes (Signed)
Pt up to use urinal, steady on feet.  Standby assistance used.

## 2016-04-06 NOTE — Telephone Encounter (Signed)
Called to report that the swelling at his neck is not any better at all and his head seems to be hurting more. Continues to be unable to swallow his pills. States h was otld to call back if not better after starting the antibiotics.

## 2016-04-06 NOTE — Telephone Encounter (Signed)
Received a call from radiology regarding extensive IJ DVT; recommend going to the emergency room for immediate anticoagulation- given the extensive DVT patient might need IV heparin/hospitalization. Spoke to the patient's wife/ states that she will take him to the hospital now.

## 2016-04-06 NOTE — ED Notes (Addendum)
Called lab to verify that blue top was within limits to run add on of APTT.  Russell in lab verified that tube could be used.

## 2016-04-06 NOTE — ED Notes (Signed)
Pt resting comfortably at this time.  Family at bedside.  Pt denies any needs at this time.

## 2016-04-06 NOTE — Progress Notes (Signed)
ANTIBIOTIC CONSULT NOTE - INITIAL  Pharmacy Consult for Vancomycin  Indication: sepsis  Allergies  Allergen Reactions  . Clarithromycin Nausea And Vomiting  . Penicillins Nausea And Vomiting, Rash and Other (See Comments)    Has patient had a PCN reaction causing immediate rash, facial/tongue/throat swelling, SOB or lightheadedness with hypotension: No Has patient had a PCN reaction causing severe rash involving mucus membranes or skin necrosis: No Has patient had a PCN reaction that required hospitalization No Has patient had a PCN reaction occurring within the last 10 years: No If all of the above answers are "NO", then may proceed with Cephalosporin use.    Patient Measurements: Height: '5\' 9"'$  (175.3 cm) Weight: 185 lb (83.915 kg) IBW/kg (Calculated) : 70.7 Adjusted Body Weight: 75.98 kg   Vital Signs: Temp: 98.4 F (36.9 C) (06/30 1732) Temp Source: Oral (06/30 1732) BP: 140/61 mmHg (06/30 2030) Pulse Rate: 80 (06/30 2030) Intake/Output from previous day:   Intake/Output from this shift:    Labs:  Recent Labs  04/04/16 0837 04/06/16 1800  WBC 7.6 7.8  HGB 13.3 12.3*  PLT 240 283  CREATININE 0.83 0.84   Estimated Creatinine Clearance: 77.2 mL/min (by C-G formula based on Cr of 0.84). No results for input(s): VANCOTROUGH, VANCOPEAK, VANCORANDOM, GENTTROUGH, GENTPEAK, GENTRANDOM, TOBRATROUGH, TOBRAPEAK, TOBRARND, AMIKACINPEAK, AMIKACINTROU, AMIKACIN in the last 72 hours.   Microbiology: No results found for this or any previous visit (from the past 720 hour(s)).  Medical History: Past Medical History  Diagnosis Date  . Myocardial infarct ([place])   . COPD (chronic obstructive pulmonary disease) ([place])   . Hypertension   . Diabetes mellitus without complication ([place])   . Asthma   . Lung cancer, upper lobe ([place]) 2009    Medications:   (Not in a hospital admission) Assessment: CrCl = 82.91 ml/min  Ke = 0.073 hr-1 T1/2 = 9.5 hrs Vd = 62.9 L    Goal of  Therapy:  Vancomycin trough level 15-20 mcg/ml  Plan:  Expected duration 7 days with resolution of temperature and/or normalization of WBC   Vancomycin 1250 mg IV X 1 given on 6/30 @ 22:00. Vancomycin 1250 mg IV Q12H ordered to start 7/1 @ 4:00, 6 hrs after 1st dose (stacked dosing). This pt will reach Css by 6/30 @ 22:00. Will draw 1st trough on 7/2 @ 15:30, which will be close to Css.   Helder Crisafulli D 04/06/2016,9:00 PM

## 2016-04-07 ENCOUNTER — Inpatient Hospital Stay: Payer: Medicare Other | Admitting: Anesthesiology

## 2016-04-07 ENCOUNTER — Encounter
Admission: EM | Disposition: A | Discharge status: Home / Self Care | Payer: Self-pay | Source: Home / Self Care | Attending: Internal Medicine

## 2016-04-07 DIAGNOSIS — I82890 Acute embolism and thrombosis of other specified veins: Secondary | ICD-10-CM

## 2016-04-07 HISTORY — DX: Acute embolism and thrombosis of other specified veins: I82.890

## 2016-04-07 HISTORY — PX: PORT-A-CATH REMOVAL: SHX5289

## 2016-04-07 LAB — CBC
HCT: 33 % — ABNORMAL LOW (ref 40.0–52.0)
Hemoglobin: 11.8 g/dL — ABNORMAL LOW (ref 13.0–18.0)
MCH: 29.5 pg (ref 26.0–34.0)
MCHC: 35.9 g/dL (ref 32.0–36.0)
MCV: 82.3 fL (ref 80.0–100.0)
Platelets: 262 10*3/uL (ref 150–440)
RBC: 4.01 MIL/uL — ABNORMAL LOW (ref 4.40–5.90)
RDW: 15.7 % — AB (ref 11.5–14.5)
WBC: 6.4 10*3/uL (ref 3.8–10.6)

## 2016-04-07 LAB — GLUCOSE, CAPILLARY
GLUCOSE-CAPILLARY: 131 mg/dL — AB (ref 65–99)
GLUCOSE-CAPILLARY: 206 mg/dL — AB (ref 65–99)
GLUCOSE-CAPILLARY: 143 mg/dL — AB (ref 65–99)
Glucose-Capillary: 121 mg/dL — ABNORMAL HIGH (ref 65–99)

## 2016-04-07 LAB — HEPARIN LEVEL (UNFRACTIONATED)
HEPARIN UNFRACTIONATED: 0.37 [IU]/mL (ref 0.30–0.70)
Heparin Unfractionated: 0.24 IU/mL — ABNORMAL LOW (ref 0.30–0.70)
Heparin Unfractionated: 0.38 IU/mL (ref 0.30–0.70)
Heparin Unfractionated: 0.44 IU/mL (ref 0.30–0.70)

## 2016-04-07 LAB — BASIC METABOLIC PANEL
Anion gap: 8 (ref 5–15)
BUN: 10 mg/dL (ref 6–20)
CALCIUM: 8.7 mg/dL — AB (ref 8.9–10.3)
CO2: 28 mmol/L (ref 22–32)
CREATININE: 0.78 mg/dL (ref 0.61–1.24)
Chloride: 100 mmol/L — ABNORMAL LOW (ref 101–111)
GFR calc Af Amer: >60 mL/min (ref >60)
GFR calc non Af Amer: >60 mL/min (ref >60)
GLUCOSE: 119 mg/dL — AB (ref 65–99)
Potassium: 4 mmol/L (ref 3.5–5.1)
Sodium: 136 mmol/L (ref 135–145)

## 2016-04-07 LAB — HEMOGLOBIN A1C: Hgb A1c MFr Bld: 6.9 % — ABNORMAL HIGH (ref 4.0–6.0)

## 2016-04-07 SURGERY — REMOVAL PORT-A-CATH
Anesthesia: General

## 2016-04-07 MED ORDER — FENTANYL CITRATE (PF) 100 MCG/2ML IJ SOLN
INTRAMUSCULAR | Status: AC
  Filled 2016-04-07: qty 2

## 2016-04-07 MED ORDER — NICOTINE 14 MG/24HR TD PT24
14.0000 mg | MEDICATED_PATCH | Freq: Every day | TRANSDERMAL | Status: DC
  Administered 2016-04-07 – 2016-04-09 (×3): 14 mg via TRANSDERMAL
  Filled 2016-04-07 (×3): qty 1

## 2016-04-07 MED ORDER — FENTANYL CITRATE (PF) 100 MCG/2ML IJ SOLN
25.0000 ug | INTRAMUSCULAR | Status: DC | PRN
  Administered 2016-04-07: 25 ug via INTRAVENOUS

## 2016-04-07 MED ORDER — MIDAZOLAM HCL 2 MG/2ML IJ SOLN
INTRAMUSCULAR | Status: DC | PRN
  Administered 2016-04-07: 1 mg via INTRAVENOUS

## 2016-04-07 MED ORDER — FENTANYL CITRATE (PF) 100 MCG/2ML IJ SOLN
INTRAMUSCULAR | Status: DC | PRN
  Administered 2016-04-07: 50 ug via INTRAVENOUS

## 2016-04-07 MED ORDER — LIDOCAINE-EPINEPHRINE 1 %-1:100000 IJ SOLN
INTRAMUSCULAR | Status: AC
  Filled 2016-04-07: qty 1

## 2016-04-07 MED ORDER — PROPOFOL 10 MG/ML IV BOLUS
INTRAVENOUS | Status: DC | PRN
  Administered 2016-04-07: 50 mg via INTRAVENOUS

## 2016-04-07 MED ORDER — ONDANSETRON HCL 4 MG/2ML IJ SOLN: 4.0000 mg | Freq: Once | INTRAMUSCULAR | Status: DC | PRN

## 2016-04-07 MED ORDER — TRAMADOL HCL 50 MG PO TABS
50.0000 mg | ORAL_TABLET | Freq: Three times a day (TID) | ORAL | Status: DC | PRN
  Filled 2016-04-07: qty 1

## 2016-04-07 MED ORDER — LIDOCAINE HCL (CARDIAC) 20 MG/ML IV SOLN
INTRAVENOUS | Status: DC | PRN
  Administered 2016-04-07: 40 mg via INTRAVENOUS

## 2016-04-07 MED ORDER — HEPARIN BOLUS VIA INFUSION
1200.0000 [IU] | Freq: Once | INTRAVENOUS | Status: AC
  Administered 2016-04-07: 05:00:00 1200 [IU] via INTRAVENOUS
  Filled 2016-04-07: qty 1200

## 2016-04-07 MED ORDER — LIDOCAINE HCL 1 % IJ SOLN
INTRAMUSCULAR | Status: DC | PRN
  Administered 2016-04-07: 2 mL

## 2016-04-07 SURGICAL SUPPLY — 27 items
CANISTER SUCT 1200ML W/VALVE (MISCELLANEOUS) ×3 IMPLANT
CHLORAPREP W/TINT 26ML (MISCELLANEOUS) ×3 IMPLANT
CLOSURE WOUND 1/4X4 (GAUZE/BANDAGES/DRESSINGS) ×1
COVER LIGHT HANDLE STERIS (MISCELLANEOUS) ×6 IMPLANT
DRESSING TELFA 4X3 1S ST N-ADH (GAUZE/BANDAGES/DRESSINGS) ×1 IMPLANT
DRSG TEGADERM 2-3/8X2-3/4 SM (GAUZE/BANDAGES/DRESSINGS) ×2 IMPLANT
DRSG TEGADERM 4X4.75 (GAUZE/BANDAGES/DRESSINGS) ×1 IMPLANT
ELECT CAUTERY NEEDLE TIP 1.0 (MISCELLANEOUS) ×3
ELECT REM PT RETURN 9FT ADLT (ELECTROSURGICAL) ×3
ELECTRODE CAUTERY NEDL TIP 1.0 (MISCELLANEOUS) ×1 IMPLANT
ELECTRODE REM PT RTRN 9FT ADLT (ELECTROSURGICAL) ×1 IMPLANT
GAUZE SPONGE 4X4 12PLY STRL (GAUZE/BANDAGES/DRESSINGS) ×2 IMPLANT
GLOVE BIO SURGEON STRL SZ7.5 (GLOVE) ×7 IMPLANT
GLOVE INDICATOR 8.0 STRL GRN (GLOVE) ×3 IMPLANT
GOWN STRL REUS W/ TWL LRG LVL3 (GOWN DISPOSABLE) ×2 IMPLANT
GOWN STRL REUS W/TWL LRG LVL3 (GOWN DISPOSABLE) ×6
LABEL OR SOLS (LABEL) ×3 IMPLANT
LIQUID BAND (GAUZE/BANDAGES/DRESSINGS) ×2 IMPLANT
PACK PORT-A-CATH (MISCELLANEOUS) ×3 IMPLANT
STRAP SAFETY BODY (MISCELLANEOUS) ×3 IMPLANT
STRIP CLOSURE SKIN 1/4X4 (GAUZE/BANDAGES/DRESSINGS) ×2 IMPLANT
SUT ETHILON 4-0 (SUTURE) ×3
SUT ETHILON 4-0 FS2 18XMFL BLK (SUTURE) ×1
SUT VIC AB 3-0 SH 27 (SUTURE) ×3
SUT VIC AB 3-0 SH 27X BRD (SUTURE) ×1 IMPLANT
SUT VIC AB 4-0 FS2 27 (SUTURE) ×3 IMPLANT
SUTURE ETHLN 4-0 FS2 18XMF BLK (SUTURE) ×1 IMPLANT

## 2016-04-07 NOTE — Anesthesia Preprocedure Evaluation (Addendum)
Anesthesia Evaluation  Patient identified by MRN, date of birth, ID band Patient awake    Reviewed: Allergy & Precautions, NPO status , Patient's Chart, lab work & pertinent test results  History of Anesthesia Complications Negative for: history of anesthetic complications  Airway Mallampati: III       Dental   Pulmonary asthma , COPD,  COPD inhaler, Current Smoker,           Cardiovascular hypertension, Pt. on medications + Past MI and + Peripheral Vascular Disease       Neuro/Psych    GI/Hepatic negative GI ROS, Neg liver ROS,   Endo/Other  diabetes, Type 2, Oral Hypoglycemic Agents  Renal/GU negative Renal ROS     Musculoskeletal  (+) Arthritis , Osteoarthritis,    Abdominal   Peds  Hematology  (+) anemia ,   Anesthesia Other Findings   Reproductive/Obstetrics                            Anesthesia Physical Anesthesia Plan  ASA: III and emergent  Anesthesia Plan: General   Post-op Pain Management:    Induction: Intravenous  Airway Management Planned: Nasal Cannula  Additional Equipment:   Intra-op Plan:   Post-operative Plan:   Informed Consent: I have reviewed the patients History and Physical, chart, labs and discussed the procedure including the risks, benefits and alternatives for the proposed anesthesia with the patient or authorized representative who has indicated his/her understanding and acceptance.     Plan Discussed with:   Anesthesia Plan Comments:         Anesthesia Quick Evaluation

## 2016-04-07 NOTE — Progress Notes (Signed)
ANTICOAGULATION CONSULT NOTE - Follow Up Consult  Pharmacy Consult for Heparin Indication: VTE treatment- stenosis on carotid scan  Allergies  Allergen Reactions  . Clarithromycin Nausea And Vomiting  . Penicillins Nausea And Vomiting, Rash and Other (See Comments)    Has patient had a PCN reaction causing immediate rash, facial/tongue/throat swelling, SOB or lightheadedness with hypotension: No Has patient had a PCN reaction causing severe rash involving mucus membranes or skin necrosis: No Has patient had a PCN reaction that required hospitalization No Has patient had a PCN reaction occurring within the last 10 years: No If all of the above answers are "NO", then may proceed with Cephalosporin use.    Patient Measurements: Height: '5\' 9"'$  (175.3 cm) Weight: 182 lb 1.6 oz (82.6 kg) IBW/kg (Calculated) : 70.7 Heparin Dosing Weight: 82.6 kg  Vital Signs: Temp: 98.6 F (37 C) (06/30 2225) Temp Source: Oral (06/30 2225) BP: 125/61 mmHg (06/30 2225) Pulse Rate: 84 (06/30 2225)  Labs:  Recent Labs  04/04/16 0837 04/06/16 1800 04/07/16 0242  HGB 13.3 12.3*  --   HCT 38.6* 37.1*  --   PLT 240 283  --   APTT  --  35  --   LABPROT  --  14.3  --   INR  --  1.09  --   HEPARINUNFRC  --   --  0.24*  CREATININE 0.83 0.84  --     Estimated Creatinine Clearance: 77.2 mL/min (by C-G formula based on Cr of 0.84).   Medications:  Infusions:  . sodium chloride 75 mL/hr at 04/06/16 2238  . heparin 1,350 Units/hr (04/06/16 1943)    Assessment: Patient currently ordered Heparin 1350 units/hr infusion.  7/1 02:30 Heparin level resulted at 0.24  Goal of Therapy:  Heparin level 0.3-0.7 units/ml Monitor platelets by anticoagulation protocol: Yes   Plan:  Will bolus with Heparin 1200 units IV then increase drip rate t 1500 unit/hr. Will check next Heparin level in 6hr at 11:00.  Paulina Fusi, PharmD, BCPS 04/07/2016 4:29 AM

## 2016-04-07 NOTE — Progress Notes (Signed)
ANTICOAGULATION CONSULT NOTE - Follow Up Consult  Pharmacy Consult for Heparin Indication: VTE treatment- stenosis on carotid scan  Allergies  Allergen Reactions  . Clarithromycin Nausea And Vomiting  . Penicillins Nausea And Vomiting, Rash and Other (See Comments)    Has patient had a PCN reaction causing immediate rash, facial/tongue/throat swelling, SOB or lightheadedness with hypotension: No Has patient had a PCN reaction causing severe rash involving mucus membranes or skin necrosis: No Has patient had a PCN reaction that required hospitalization No Has patient had a PCN reaction occurring within the last 10 years: No If all of the above answers are "NO", then may proceed with Cephalosporin use.    Patient Measurements: Height: '5\' 9"'$  (175.3 cm) Weight: 182 lb 1.6 oz (82.6 kg) IBW/kg (Calculated) : 70.7 Heparin Dosing Weight: 82.6 kg  Vital Signs: Temp: 98.6 F (37 C) (07/01 1345) Temp Source: Oral (07/01 1216) BP: 133/62 mmHg (07/01 1416) Pulse Rate: 75 (07/01 1416)  Labs:  Recent Labs  04/06/16 1800  04/07/16 0849 04/07/16 1043 04/07/16 1907  HGB 12.3*  --  11.8*  --   --   HCT 37.1*  --  33.0*  --   --   PLT 283  --  262  --   --   APTT 35  --   --   --   --   LABPROT 14.3  --   --   --   --   INR 1.09  --   --   --   --   HEPARINUNFRC  --   < > 0.38 0.37 0.44  CREATININE 0.84  --  0.78  --   --   < > = values in this interval not displayed.  Estimated Creatinine Clearance: 81 mL/min (by C-G formula based on Cr of 0.78).   Medications:  Infusions:  . heparin 1,500 Units/hr (04/07/16 1124)    Assessment: Patient currently ordered Heparin 1350 units/hr infusion.  7/1 02:30 Heparin level resulted at 0.24  Goal of Therapy:  Heparin level 0.3-0.7 units/ml Monitor platelets by anticoagulation protocol: Yes   Plan:   Will bolus with Heparin 1200 units IV then increase drip rate t 1500 unit/hr. Will check next Heparin level in 6hr at 11:00.  7/1  Heparin level at 1043= 0.37. Will check confirmatory level in 8 hrs at 1830.  7/1 PM: HL= 0.44. Will f/u AM labs.   Ulice Dash, PharmD Clinical Pharmacist   04/07/2016  7:33 PM

## 2016-04-07 NOTE — Transfer of Care (Signed)
Immediate Anesthesia Transfer of Care Note  Patient: Richard Jimenez  Procedure(s) Performed: Procedure(s): REMOVAL PORT-A-CATH (N/A)  Patient Location: PACU  Anesthesia Type:MAC  Level of Consciousness: awake  Airway & Oxygen Therapy: Patient Spontanous Breathing and Patient connected to nasal cannula oxygen  Post-op Assessment: Report given to RN and Post -op Vital signs reviewed and stable  Post vital signs: Reviewed and stable  Last Vitals:  Filed Vitals:   04/07/16 0508 04/07/16 1216  BP: 125/61 127/75  Pulse: 80 84  Temp: 36.8 C 36.9 C  Resp: 16 18    Last Pain:  Filed Vitals:   04/07/16 1349  PainSc: Asleep         Complications: No apparent anesthesia complications

## 2016-04-07 NOTE — Progress Notes (Signed)
[facility] [place] NAME: Richard Jimenez    MR#:  [id number]  DATE OF BIRTH:  1941/02/05  SUBJECTIVE: seen  at the bedside, admitted for right IJ thrombosis. He says he feels little better today. And waiting for Port-A-Cath removal.   CHIEF COMPLAINT:   Chief Complaint  Patient presents with  . Neck Pain    REVIEW OF SYSTEMS:    Review of Systems  Constitutional: Negative for fever and chills.  HENT: Negative for hearing loss.        Right-sided neck pain. But better than yesterday.  Eyes: Negative for blurred vision, double vision and photophobia.  Respiratory: Negative for cough, hemoptysis and shortness of breath.   Cardiovascular: Negative for palpitations, orthopnea and leg swelling.  Gastrointestinal: Negative for vomiting, abdominal pain and diarrhea.  Genitourinary: Negative for dysuria and urgency.  Musculoskeletal: Negative for myalgias and neck pain.  Skin: Negative for rash.  Neurological: Negative for dizziness, focal weakness, seizures, weakness and headaches.  Psychiatric/Behavioral: Negative for memory loss. The patient does not have insomnia.     Nutrition:  Tolerating Diet: Tolerating PT:      DRUG ALLERGIES:   Allergies  Allergen Reactions  . Clarithromycin Nausea And Vomiting  . Penicillins Nausea And Vomiting, Rash and Other (See Comments)    Has patient had a PCN reaction causing immediate rash, facial/tongue/throat swelling, SOB or lightheadedness with hypotension: No Has patient had a PCN reaction causing severe rash involving mucus membranes or skin necrosis: No Has patient had a PCN reaction that required hospitalization No Has patient had a PCN reaction occurring within the last 10 years: No If all of the above answers are "NO", then may proceed with Cephalosporin use.    VITALS:  Blood pressure 125/61, pulse 80, temperature 98.2 F (36.8 C), temperature source Oral, resp. rate 16, height  '5\' 9"'$  (1.753 m), weight 82.6 kg (182 lb 1.6 oz), SpO2 96 %.  PHYSICAL EXAMINATION:   Physical Exam  GENERAL:  75 y.o.-year-old patient lying in the bed with no acute distress.  EYES: Pupils equal, round, reactive to light and accommodation. No scleral icterus. Extraocular muscles intact.  HEENT: Head atraumatic, normocephalic. Oropharynx and nasopharynx clear.  NECK: swollen on right side. Slight  Tenderness.no jvd. LUNGS: Diminished breath sounds bilaterally, no wheezing noted. CARDIOVASCULAR: S1, S2 normal. No murmurs, rubs, or gallops.  ABDOMEN: Soft, nontender, nondistended. Bowel sounds present. No organomegaly or mass.  EXTREMITIES: No pedal edema, cyanosis, or clubbing.  NEUROLOGIC: Cranial nerves II through XII are intact. Muscle strength 5/5 in all extremities. Sensation intact. Gait not checked.  PSYCHIATRIC: The patient is alert and oriented x 3.  SKIN: No obvious rash, lesion, or ulcer.    LABORATORY PANEL:   CBC  Recent Labs Lab 04/07/16 0849  WBC 6.4  HGB 11.8*  HCT 33.0*  PLT 262   ------------------------------------------------------------------------------------------------------------------  Chemistries   Recent Labs Lab 04/04/16 0837  04/07/16 0849  NA 131*  < > 136  K 4.4  < > 4.0  CL 93*  < > 100*  CO2 30  < > 28  GLUCOSE 141*  < > 119*  BUN 10  < > 10  CREATININE 0.83  < > 0.78  CALCIUM 8.9  < > 8.7*  AST 14*  --   --   ALT 12*  --   --   ALKPHOS 50  --   --   BILITOT 0.8  --   --   < > =  values in this interval not displayed. ------------------------------------------------------------------------------------------------------------------  Cardiac Enzymes No results for input(s): TROPONINI in the last 168 hours. ------------------------------------------------------------------------------------------------------------------  RADIOLOGY:  Ct Soft Tissue Neck W Contrast  04/06/2016  CLINICAL DATA:  75 year old male with right neck pain  and swelling since 04/03/2016. Lung cancer post chemotherapy. Left vocal cord surgery 4 months ago. Initial encounter. EXAM: CT NECK WITH CONTRAST TECHNIQUE: Multidetector CT imaging of the neck was performed using the standard protocol following the bolus administration of intravenous contrast. CONTRAST:  5m ISOVUE-300 IOPAMIDOL (ISOVUE-300) INJECTION 61% COMPARISON:  07/19/2011 neck CT. 01/17/2016 brain MR. 01/17/2016 PET CT. FINDINGS: Pharynx and larynx: Mild impression upon the right aspect of the palatine tonsil/ pharynx by the inflamed enlarged right internal jugular vein. Prominence of the palatine tonsils and lingual tonsils without definitive mass identified. Marked narrowing supraglottic airway. Teflon left vocal cord. Heterogeneous appearance of the thyroid cartilage similar to prior exam. Salivary glands: No primary worrisome mass. Thyroid: No worrisome dominant mass. Lymph nodes: Interval enlargement of lymph node posterior right submandibular gland immediately anterior to the thrombosed inflamed right internal jugular vein may be reactive in origin. Attention to this on follow-up. Previously noted left level 4 and level 5 malignant adenopathy has decreased in size now measuring up to 1.5 cm versus prior 2.5 cm. Vascular: New from the 01/17/2016 brain MR is thrombosis of the right internal jugular vein. This extends from the level of the skull base (right jugular fossa) throughout the neck to level of the right innominate vein. A right central port is in place entering the thrombosed right internal jugular vein with the tip not imaged on the current exam. The thrombosed right internal jugular vein is enlarged and inflamed with adjacent reactive changes. Right carotid artery displaced by a thrombosed right internal jugular vein. Prominent plaque right carotid bifurcation/ proximal right internal carotid artery with 66% diameter stenosis proximal right internal carotid artery and moderate narrowing just  below the skullbase. Moderate narrowing left common carotid artery. Left carotid bifurcation calcifications with less than 60% diameter stenosis. Aortic atherosclerosis. Limited intracranial: No enhancing lesion. Visualized orbits: Negative. Mastoids and visualized paranasal sinuses: Clear. 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 Upper chest: Left upper lobe consolidation consistent with post therapy changes. The previously noted nodular components along the anterior and posterior aspect of the left thorax are less well delineated on current exam however, right upper lobe nodular density has enlarged slightly now with maximal transverse dimension of 1.6 cm versus prior 1.3 cm. 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 Port enters this thrombosed vessel. The vessel is enlarged and inflamed. Patient is at risk for right sigmoid sinus thrombosis. Decrease size of left level 4 and 5 lymph nodes and nodularity left upper lung. Slight increase in size of right nodularity (current 1.6 cm versus prior 1.3 cm). New enlarged lymph node posterior right submandibular gland may be reactive in origin given the adjacent thrombosed vessel rather than metastatic in origin. Attention to this on follow-up. Aortic atherosclerosis 66% diameter stenosis proximal right internal carotid artery and moderate narrowing just below the skullbase. Moderate narrowing left common carotid artery. Left carotid bifurcation calcifications with less than 60% diameter stenosis. These results were called by telephone at the time of interpretation on 04/06/2016 at 4:34 pm to Dr. GCharlaine Dalton, who verbally acknowledged these results. Electronically Signed   By: SAlcide EvenerD.  On: 04/06/2016 16:59     ASSESSMENT AND PLAN:   Principal Problem:   Internal jugular vein thrombosis  (HCC) Active Problems:   Neck pain   Hyponatremia   Anemia 1. right IJ thrombosis and thrombophlebitis: ; Started on heparin drip, IV vancomycin. Seen by oncology, scheduled for a right-sided Port-A-Cath removed today. After that he can eat.*#2 hypernatremia secondary to dehydration: Improved with IV fluids.   #3 COPD: No wheezing patient is on home medications, nebulizers. History of lung cancer seen by Dr. Rogue Bussing,     All the records are reviewed and case discussed with Care Management/Social Workerr. Management plans discussed with the patient, family and they are in agreement.  CODE STATUS:full  TOTAL TIME TAKING CARE OF THIS PATIENT: 86mnutes.   POSSIBLE D/C IN 1-2 DAYS, DEPENDING ON CLINICAL CONDITION.   KEpifanio LeschesM.D on 04/07/2016 at 12:13 PM  Between 7am to 6pm - Pager - [phone]  After 6pm go to [email or web address] - password [email or web address]  Office  [phone] CC: Primary care physician; WMadelyn Brunner MD

## 2016-04-07 NOTE — Progress Notes (Signed)
ANTICOAGULATION CONSULT NOTE - Follow Up Consult  Pharmacy Consult for Heparin Indication: VTE treatment- stenosis on carotid scan  Allergies  Allergen Reactions  . Clarithromycin Nausea And Vomiting  . Penicillins Nausea And Vomiting, Rash and Other (See Comments)    Has patient had a PCN reaction causing immediate rash, facial/tongue/throat swelling, SOB or lightheadedness with hypotension: No Has patient had a PCN reaction causing severe rash involving mucus membranes or skin necrosis: No Has patient had a PCN reaction that required hospitalization No Has patient had a PCN reaction occurring within the last 10 years: No If all of the above answers are "NO", then may proceed with Cephalosporin use.    Patient Measurements: Height: '5\' 9"'$  (175.3 cm) Weight: 182 lb 1.6 oz (82.6 kg) IBW/kg (Calculated) : 70.7 Heparin Dosing Weight: 82.6 kg  Vital Signs: Temp: 98.2 F (36.8 C) (07/01 0508) Temp Source: Oral (07/01 0508) BP: 125/61 mmHg (07/01 0508) Pulse Rate: 80 (07/01 0508)  Labs:  Recent Labs  04/06/16 1800 04/07/16 0242 04/07/16 0849 04/07/16 1043  HGB 12.3*  --  11.8*  --   HCT 37.1*  --  33.0*  --   PLT 283  --  262  --   APTT 35  --   --   --   LABPROT 14.3  --   --   --   INR 1.09  --   --   --   HEPARINUNFRC  --  0.24* 0.38 0.37  CREATININE 0.84  --  0.78  --     Estimated Creatinine Clearance: 81 mL/min (by C-G formula based on Cr of 0.78).   Medications:  Infusions:  . sodium chloride 75 mL/hr at 04/06/16 2238  . heparin 1,500 Units/hr (04/07/16 0435)    Assessment: Patient currently ordered Heparin 1350 units/hr infusion.  7/1 02:30 Heparin level resulted at 0.24  Goal of Therapy:  Heparin level 0.3-0.7 units/ml Monitor platelets by anticoagulation protocol: Yes   Plan:   Will bolus with Heparin 1200 units IV then increase drip rate t 1500 unit/hr. Will check next Heparin level in 6hr at 11:00.  7/1 Heparin level at 1043= 0.37. Will check  confirmatory level in 8 hrs at 1830.  Chinita Greenland PharmD Clinical Pharmacist 04/07/2016  11:23 AM

## 2016-04-07 NOTE — Plan of Care (Signed)
Problem: Nutrition: Goal: Adequate nutrition will be maintained Outcome: Progressing Diet resumed after surgery. Tolerating Well.

## 2016-04-07 NOTE — Progress Notes (Signed)
Dr Ernesto Rutherford ordered nicotine patch 14 mg daily and tramadol 50 mg oral every 8 hours as needed

## 2016-04-07 NOTE — Op Note (Signed)
    Patient name: Richard Jimenez MRN: [id number] DOB: [birth year] Sex: male  04/06/2016 - 04/07/2016 Pre-operative Diagnosis: DVT, right internal jugular vein Post-operative diagnosis:  Same Surgeon:  Annamarie Major Assistants:  None Procedure:   Removal of right-sided Port-A-Cath Anesthesia:  Mac Blood Loss:  See anesthesia record Specimens:  Port  Findings:  No issues removing port.  It was removed in its entirety  Indications:  The patient recently had a Port-A-Cath placed for chemotherapy for lung cancer.  He has been having pain and swelling in his neck for approximately 1-2 weeks.  CT scan showed extensive DVT in the right internal jugular vein.  He comes in to have the catheter removed  Procedure:  The patient was identified in the holding area and taken to [place] 08  The patient was then placed supine on the table. MAC anesthesia was administered.  The patient was prepped and draped in the usual sterile fashion.  A time out was called and antibiotics were administered.  One percent lidocaine was used for local anesthesia.  Patient's previous incision was opened with a 10 blade.  Sharp dissection was used to expose the port.  Then dissected out the catheter portion which was removed without difficulty.  The fascial sutures were then cut and the port was removed.  The cavity was irrigated.  Hemostasis was achieved.  The incision was closed with 2 layers of 3-0 Vicryl followed by Dermabond.  There were no immediate Patients.   Disposition:  To PACU in stable condition.   Theotis Burrow, M.D. [facility] Office: [phone] Pager:  [phone]

## 2016-04-07 NOTE — Anesthesia Postprocedure Evaluation (Signed)
Anesthesia Post Note  Patient: Richard Jimenez  Procedure(s) Performed: Procedure(s) (LRB): REMOVAL PORT-A-CATH (N/A)  Patient location during evaluation: PACU Anesthesia Type: General Level of consciousness: awake and alert Pain management: pain level controlled Vital Signs Assessment: post-procedure vital signs reviewed and stable Respiratory status: spontaneous breathing and respiratory function stable Cardiovascular status: stable Anesthetic complications: no    Last Vitals:  Filed Vitals:   04/07/16 1216 04/07/16 1345  BP: 127/75 134/60  Pulse: 84 80  Temp: 36.9 C 37 C  Resp: 18 25    Last Pain:  Filed Vitals:   04/07/16 1400  PainSc: 0-No pain                 Iley Breeden K

## 2016-04-08 DIAGNOSIS — I82C11 Acute embolism and thrombosis of right internal jugular vein: Secondary | ICD-10-CM

## 2016-04-08 DIAGNOSIS — I252 Old myocardial infarction: Secondary | ICD-10-CM

## 2016-04-08 DIAGNOSIS — F17210 Nicotine dependence, cigarettes, uncomplicated: Secondary | ICD-10-CM

## 2016-04-08 DIAGNOSIS — C3412 Malignant neoplasm of upper lobe, left bronchus or lung: Secondary | ICD-10-CM

## 2016-04-08 DIAGNOSIS — J45909 Unspecified asthma, uncomplicated: Secondary | ICD-10-CM

## 2016-04-08 DIAGNOSIS — E119 Type 2 diabetes mellitus without complications: Secondary | ICD-10-CM

## 2016-04-08 DIAGNOSIS — J449 Chronic obstructive pulmonary disease, unspecified: Secondary | ICD-10-CM

## 2016-04-08 DIAGNOSIS — I10 Essential (primary) hypertension: Secondary | ICD-10-CM

## 2016-04-08 LAB — CREATININE, SERUM
Creatinine, Ser: 0.78 mg/dL (ref 0.61–1.24)
GFR calc non Af Amer: >60 mL/min (ref >60)

## 2016-04-08 LAB — CBC
HCT: 32.6 % — ABNORMAL LOW (ref 40.0–52.0)
Hemoglobin: 11.4 g/dL — ABNORMAL LOW (ref 13.0–18.0)
MCH: 29.1 pg (ref 26.0–34.0)
MCHC: 35 g/dL (ref 32.0–36.0)
MCV: 83.4 fL (ref 80.0–100.0)
Platelets: 268 10*3/uL (ref 150–440)
RBC: 3.91 MIL/uL — ABNORMAL LOW (ref 4.40–5.90)
RDW: 16 % — AB (ref 11.5–14.5)
WBC: 8.4 10*3/uL (ref 3.8–10.6)

## 2016-04-08 LAB — GLUCOSE, CAPILLARY
GLUCOSE-CAPILLARY: 147 mg/dL — AB (ref 65–99)
GLUCOSE-CAPILLARY: 240 mg/dL — AB (ref 65–99)
GLUCOSE-CAPILLARY: 111 mg/dL — AB (ref 65–99)
Glucose-Capillary: 110 mg/dL — ABNORMAL HIGH (ref 65–99)

## 2016-04-08 LAB — HEPARIN LEVEL (UNFRACTIONATED): Heparin Unfractionated: 0.33 IU/mL (ref 0.30–0.70)

## 2016-04-08 LAB — VANCOMYCIN, TROUGH: VANCOMYCIN TR: 11 ug/mL — AB (ref 15–20)

## 2016-04-08 MED ORDER — SODIUM CHLORIDE 0.9% FLUSH: 3.0000 mL | INTRAVENOUS | Status: DC | PRN

## 2016-04-08 MED ORDER — GUAIFENESIN-DM 100-10 MG/5ML PO SYRP
5.0000 mL | ORAL_SOLUTION | ORAL | Status: DC | PRN
  Administered 2016-04-08: 5 mL via ORAL
  Filled 2016-04-08: qty 5

## 2016-04-08 MED ORDER — SODIUM CHLORIDE 0.9% FLUSH
3.0000 mL | Freq: Two times a day (BID) | INTRAVENOUS | Status: DC
  Administered 2016-04-08 – 2016-04-09 (×3): 3 mL via INTRAVENOUS

## 2016-04-08 MED ORDER — CEPHALEXIN 250 MG PO CAPS
500.0000 mg | ORAL_CAPSULE | Freq: Two times a day (BID) | ORAL | Status: DC
  Administered 2016-04-08 – 2016-04-09 (×2): 500 mg via ORAL
  Filled 2016-04-08 (×2): qty 2

## 2016-04-08 NOTE — Care Management Important Message (Signed)
Important Message  Patient Details  Name: HAGAN VANAUKEN MRN: [id number] Date of Birth: 1941-05-28   Medicare Important Message Given:  Yes    Brynda Heick A, RN 04/08/2016, 11:46 AM

## 2016-04-08 NOTE — Progress Notes (Signed)
[facility] [place] NAME: Richard Jimenez    MR#:  [id number]  DATE OF BIRTH:  08/03/1941  SUBJECTIVE: He is feeling much better. Has some wheezing but denies any other complaints. Right neck pain is better.   CHIEF COMPLAINT:   Chief Complaint  Patient presents with  . Neck Pain    REVIEW OF SYSTEMS:    Review of Systems  Constitutional: Negative for fever and chills.  HENT: Negative for hearing loss.        Right-sided neck pain. But better than yesterday.  Eyes: Negative for blurred vision, double vision and photophobia.  Respiratory: Negative for cough, hemoptysis and shortness of breath.   Cardiovascular: Negative for palpitations, orthopnea and leg swelling.  Gastrointestinal: Negative for vomiting, abdominal pain and diarrhea.  Genitourinary: Negative for dysuria and urgency.  Musculoskeletal: Negative for myalgias and neck pain.  Skin: Negative for rash.  Neurological: Negative for dizziness, focal weakness, seizures, weakness and headaches.  Psychiatric/Behavioral: Negative for memory loss. The patient does not have insomnia.     Nutrition:  Tolerating Diet: Tolerating PT:      DRUG ALLERGIES:   Allergies  Allergen Reactions  . Clarithromycin Nausea And Vomiting  . Penicillins Nausea And Vomiting, Rash and Other (See Comments)    Has patient had a PCN reaction causing immediate rash, facial/tongue/throat swelling, SOB or lightheadedness with hypotension: No Has patient had a PCN reaction causing severe rash involving mucus membranes or skin necrosis: No Has patient had a PCN reaction that required hospitalization No Has patient had a PCN reaction occurring within the last 10 years: No If all of the above answers are "NO", then may proceed with Cephalosporin use.    VITALS:  Blood pressure 134/68, pulse 85, temperature 99.3 F (37.4 C), temperature source Oral, resp. rate 20, height '5\' 9"'$  (1.753 m), weight  82.6 kg (182 lb 1.6 oz), SpO2 95 %.  PHYSICAL EXAMINATION:   Physical Exam  GENERAL:  75 y.o.-year-old patient lying in the bed with no acute distress.  EYES: Pupils equal, round, reactive to light and accommodation. No scleral icterus. Extraocular muscles intact.  HEENT: Head atraumatic, normocephalic. Oropharynx and nasopharynx clear.  NECK: swollen on right side. No tenderness, redness on the right side of neck. LUNGS: Diminished breath sounds bilaterally, no wheezing noted. CARDIOVASCULAR: S1, S2 normal. No murmurs, rubs, or gallops.  ABDOMEN: Soft, nontender, nondistended. Bowel sounds present. No organomegaly or mass.  EXTREMITIES: No pedal edema, cyanosis, or clubbing.  NEUROLOGIC: Cranial nerves II through XII are intact. Muscle strength 5/5 in all extremities. Sensation intact. Gait not checked.  PSYCHIATRIC: The patient is alert and oriented x 3.  SKIN: No obvious rash, lesion, or ulcer.    LABORATORY PANEL:   CBC  Recent Labs Lab 04/08/16 0447  WBC 8.4  HGB 11.4*  HCT 32.6*  PLT 268   ------------------------------------------------------------------------------------------------------------------  Chemistries   Recent Labs Lab 04/04/16 0837  04/07/16 0849  NA 131*  < > 136  K 4.4  < > 4.0  CL 93*  < > 100*  CO2 30  < > 28  GLUCOSE 141*  < > 119*  BUN 10  < > 10  CREATININE 0.83  < > 0.78  CALCIUM 8.9  < > 8.7*  AST 14*  --   --   ALT 12*  --   --   ALKPHOS 50  --   --   BILITOT 0.8  --   --   < > =  values in this interval not displayed. ------------------------------------------------------------------------------------------------------------------  Cardiac Enzymes No results for input(s): TROPONINI in the last 168 hours. ------------------------------------------------------------------------------------------------------------------  RADIOLOGY:  Ct Soft Tissue Neck W Contrast  04/06/2016  CLINICAL DATA:  75 year old male with right neck pain  and swelling since 04/03/2016. Lung cancer post chemotherapy. Left vocal cord surgery 4 months ago. Initial encounter. EXAM: CT NECK WITH CONTRAST TECHNIQUE: Multidetector CT imaging of the neck was performed using the standard protocol following the bolus administration of intravenous contrast. CONTRAST:  41m ISOVUE-300 IOPAMIDOL (ISOVUE-300) INJECTION 61% COMPARISON:  07/19/2011 neck CT. 01/17/2016 brain MR. 01/17/2016 PET CT. FINDINGS: Pharynx and larynx: Mild impression upon the right aspect of the palatine tonsil/ pharynx by the inflamed enlarged right internal jugular vein. Prominence of the palatine tonsils and lingual tonsils without definitive mass identified. Marked narrowing supraglottic airway. Teflon left vocal cord. Heterogeneous appearance of the thyroid cartilage similar to prior exam. Salivary glands: No primary worrisome mass. Thyroid: No worrisome dominant mass. Lymph nodes: Interval enlargement of lymph node posterior right submandibular gland immediately anterior to the thrombosed inflamed right internal jugular vein may be reactive in origin. Attention to this on follow-up. Previously noted left level 4 and level 5 malignant adenopathy has decreased in size now measuring up to 1.5 cm versus prior 2.5 cm. Vascular: New from the 01/17/2016 brain MR is thrombosis of the right internal jugular vein. This extends from the level of the skull base (right jugular fossa) throughout the neck to level of the right innominate vein. A right central port is in place entering the thrombosed right internal jugular vein with the tip not imaged on the current exam. The thrombosed right internal jugular vein is enlarged and inflamed with adjacent reactive changes. Right carotid artery displaced by a thrombosed right internal jugular vein. Prominent plaque right carotid bifurcation/ proximal right internal carotid artery with 66% diameter stenosis proximal right internal carotid artery and moderate narrowing just  below the skullbase. Moderate narrowing left common carotid artery. Left carotid bifurcation calcifications with less than 60% diameter stenosis. Aortic atherosclerosis. Limited intracranial: No enhancing lesion. Visualized orbits: Negative. Mastoids and visualized paranasal sinuses: Clear. 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 Upper chest: Left upper lobe consolidation consistent with post therapy changes. The previously noted nodular components along the anterior and posterior aspect of the left thorax are less well delineated on current exam however, right upper lobe nodular density has enlarged slightly now with maximal transverse dimension of 1.6 cm versus prior 1.3 cm. 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 Port enters this thrombosed vessel. The vessel is enlarged and inflamed. Patient is at risk for right sigmoid sinus thrombosis. Decrease size of left level 4 and 5 lymph nodes and nodularity left upper lung. Slight increase in size of right nodularity (current 1.6 cm versus prior 1.3 cm). New enlarged lymph node posterior right submandibular gland may be reactive in origin given the adjacent thrombosed vessel rather than metastatic in origin. Attention to this on follow-up. Aortic atherosclerosis 66% diameter stenosis proximal right internal carotid artery and moderate narrowing just below the skullbase. Moderate narrowing left common carotid artery. Left carotid bifurcation calcifications with less than 60% diameter stenosis. These results were called by telephone at the time of interpretation on 04/06/2016 at 4:34 pm to Dr. GCharlaine Dalton, who verbally acknowledged these results. Electronically Signed   By: SAlcide EvenerD.  On: 04/06/2016 16:59     ASSESSMENT AND PLAN:   Principal Problem:   Internal jugular vein thrombosis  (HCC) Active Problems:   Neck pain   Hyponatremia   Anemia   Jugular vein thrombosis, right 1. right IJ thrombosis and thrombophlebitis: ; Started on heparin drip, IV vancomycin. S/p removal of  Port-A-Cath yesterday. I spoke with oncology who recommended continuing IV heparin 1 more day, discharged tomorrow with oral anticoagulants likely Xarelto.tomorrow,  plan for  new Port-A-Cath placement as an outpatient as per oncolgy,, discussed risks and benefits of the antcoagulation the patient.   #3 COPD: No wheezing patient is on home medications, nebulizers. History of lung cancer seen by Dr. Rogue Bussing,     All the records are reviewed and case discussed with Care Management/Social Workerr. Management plans discussed with the patient, family and they are in agreement.  CODE STATUS:full  TOTAL TIME TAKING CARE OF THIS PATIENT: 36mnutes.   POSSIBLE D/C IN 1-2 DAYS, DEPENDING ON CLINICAL CONDITION.   KEpifanio LeschesM.D on 04/08/2016 at 12:14 PM  Between 7am to 6pm - Pager - [phone]  After 6pm go to [email or web address] - password [email or web address]  Office  [phone] CC: Primary care physician; WMadelyn Brunner MD

## 2016-04-08 NOTE — Consult Note (Signed)
[name] NOTE  Patient Care Team: Lottie Mussel III, MD as PCP - General (Internal Medicine)  CHIEF COMPLAINTS/PURPOSE OF CONSULTATION:  Small cell lung cancer/DVT of the right neck  HISTORY OF PRESENTING ILLNESS:  Richard Jimenez 75 y.o.  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 Outpatient CT scan showed significant DVT of the right IJ- extending from the jugular fossa to the innominate vein- significant risk for extending into the intracranial veins. Patient was immediately asked to go to the emergency room/started on IV heparin. Patient has been evaluated by vascular; and the Mediport is explanted yesterday.  Patient continues to be on IV heparin; pain significantly improved. He continues to have mild pain especially with movement on the right side of the neck. Denies any gum bleeding or nose bleeds.   He denies any worsening headaches or vision changes. Appetite is fair.  No nausea no vomiting or diarrhea. Breathing is stable.  ROS: A complete 10 point review of system is done which is negative except mentioned above in history of present illness  MEDICAL HISTORY:  Past Medical History  Diagnosis Date  . Myocardial infarct ([place])   . COPD (chronic obstructive pulmonary disease) ([place])   . Hypertension   . Diabetes mellitus without complication ([place])   . Asthma   . Lung cancer, upper lobe ([place]) 2009    SURGICAL HISTORY: Past Surgical History  Procedure Laterality Date  . Throat surgery    . Tumor removal Right     "years ago-right lower posterior side"  . Peripheral vascular catheterization N/A 02/21/2016    Procedure: Glori Luis Cath Insertion;  Surgeon: Algernon Huxley, MD;  Location: [facility];  Service: Cardiovascular;   Laterality: N/A;    SOCIAL HISTORY: Social History   Social History  . Marital Status: Married    Spouse Name: N/A  . Number of Children: N/A  . Years of Education: N/A   Occupational History  . Not on file.   Social History Main Topics  . Smoking status: Current Every Day Smoker -- 1.00 packs/day for 62 years    Types: Cigarettes  . Smokeless tobacco: Not on file  . Alcohol Use: 3.6 oz/week    6 Cans of beer per week     Comment: rarely  . Drug Use: No  . Sexual Activity: Not on file   Other Topics Concern  . Not on file   Social History Narrative    FAMILY HISTORY: Family History  Problem Relation Age of Onset  . Cancer - Colon Father   . Cancer - Colon Sister     ALLERGIES:  is allergic to clarithromycin and penicillins.  MEDICATIONS:  Current Facility-Administered Medications  Medication Dose Route Frequency Provider Last Rate Last Dose  . acetaminophen (TYLENOL) tablet 650 mg  650 mg Oral Q6H PRN Theodoro Grist, MD   650 mg at 04/07/16 1720   Or  . acetaminophen (TYLENOL) suppository 650 mg  650 mg Rectal Q6H PRN Theodoro Grist, MD      . albuterol (PROVENTIL) (2.5 MG/3ML) 0.083% nebulizer solution 2.5 mg  2.5 mg Nebulization Q6H Theodoro Grist, MD   2.5 mg at 04/08/16 0802  . albuterol (PROVENTIL) (2.5 MG/3ML) 0.083% nebulizer solution 3 mL  3 mL Inhalation Q6H PRN Theodoro Grist,  MD   3 mL at 04/07/16 1754  . docusate sodium (COLACE) capsule 100 mg  100 mg Oral BID Theodoro Grist, MD   100 mg at 04/08/16 0803  . guaiFENesin-dextromethorphan (ROBITUSSIN DM) 100-10 MG/5ML syrup 5 mL  5 mL Oral Q4H PRN Theodoro Grist, MD   5 mL at 04/08/16 0228  . heparin ADULT infusion 100 units/mL (25000 units/28m sodium chloride 0.45%)  1,500 Units/hr Intravenous Continuous LVira Blanco RPH 15 mL/hr at 04/08/16 0341 1,500 Units/hr at 04/08/16 0341  . HYDROcodone-acetaminophen (NORCO/VICODIN) 5-325 MG per tablet 1-2 tablet  1-2 tablet Oral Q4H PRN RTheodoro Grist MD   1 tablet at  04/08/16 0339  . insulin aspart (novoLOG) injection 0-5 Units  0-5 Units Subcutaneous QHS RTheodoro Grist MD   0 Units at 04/06/16 2240  . insulin aspart (novoLOG) injection 0-9 Units  0-9 Units Subcutaneous TID WC RTheodoro Grist MD   3 Units at 04/08/16 1214  . insulin aspart (novoLOG) injection 4 Units  4 Units Subcutaneous TID WC RTheodoro Grist MD   4 Units at 04/08/16 1215  . magic mouthwash  5 mL Oral QID PRN RTheodoro Grist MD      . metFORMIN (GLUCOPHAGE) tablet 1,000 mg  1,000 mg Oral BID WC RTheodoro Grist MD   1,000 mg at 04/08/16 0803  . nicotine (NICODERM CQ - dosed in mg/24 hours) patch 14 mg  14 mg Transdermal Daily Debby Crosley, MD   14 mg at 04/08/16 0812  . ondansetron (ZOFRAN) tablet 8 mg  8 mg Oral Q8H PRN RTheodoro Grist MD      . prochlorperazine (COMPAZINE) tablet 10 mg  10 mg Oral Q6H PRN RTheodoro Grist MD      . senna (SENOKOT) tablet 8.6 mg  1 tablet Oral BID RTheodoro Grist MD   8.6 mg at 04/08/16 0803  . sodium chloride flush (NS) 0.9 % injection 3 mL  3 mL Intravenous Q12H SEpifanio Lesches MD   3 mL at 04/08/16 08828 . sodium chloride flush (NS) 0.9 % injection 3 mL  3 mL Intravenous PRN SEpifanio Lesches MD      . traMADol (ULTRAM) tablet 50 mg  50 mg Oral Q8H PRN Debby Crosley, MD      . vancomycin (VANCOCIN) 1,250 mg in sodium chloride 0.9 % 250 mL IVPB  1,250 mg Intravenous Q12H RTheodoro Grist MD   1,250 mg at 04/08/16 0345      .  PHYSICAL EXAMINATION:  Filed Vitals:   04/07/16 2155 04/08/16 0459  BP: 123/59 134/68  Pulse: 80 85  Temp: 98.4 F (36.9 C) 99.3 F (37.4 C)  Resp: 18 20   Filed Weights   04/06/16 1732 04/06/16 2225  Weight: 185 lb (83.915 kg) 182 lb 1.6 oz (82.6 kg)    GENERAL: Well-nourished well-developed; Alert, no distress and comfortable.   Alone.  EYES: no pallor or icterus OROPHARYNX: no thrush or ulceration. NECK: Tenderness felt on right side of neck; mild swelling on right side of neck. LYMPH:  no palpable  lymphadenopathy in the cervical, axillary or inguinal regions LUNGS: decreased breath sounds to auscultation at bases and  No wheeze or crackles HEART/CVS: regular rate & rhythm and no murmurs; No lower extremity edema ABDOMEN: abdomen soft, non-tender and normal bowel sounds Musculoskeletal:no cyanosis of digits and no clubbing  PSYCH: alert & oriented x 3 with fluent speech NEURO: no focal motor/sensory deficits SKIN:  no rashes or significant lesions  LABORATORY DATA:  I have reviewed the  data as listed Lab Results  Component Value Date   WBC 8.4 04/08/2016   HGB 11.4* 04/08/2016   HCT 32.6* 04/08/2016   MCV 83.4 04/08/2016   PLT 268 04/08/2016    Recent Labs  01/13/16 1439 02/22/16 0922  03/14/16 0845  04/04/16 0837 04/06/16 1800 04/07/16 0849  NA  --  132*  < > 135  < > 131* 133* 136  K  --  4.1  < > 3.9  < > 4.4 4.4 4.0  CL  --  98*  < > 100*  < > 93* 94* 100*  CO2  --  26  < > 28  < > '30 30 28  '$ GLUCOSE  --  221*  < > 199*  < > 141* 130* 119*  BUN  --  14  < > 16  < > '10 11 10  '$ CREATININE 0.89 0.92  < > 0.94  < > 0.83 0.84 0.78  CALCIUM  --  8.9  < > 8.8*  < > 8.9 9.2 8.7*  GFRNONAA >60 >60  < > >60  < > >60 >60 >60  GFRAA >60 >60  < > >60  < > >60 >60 >60  PROT 8.3* 7.8  --  7.5  --  8.0  --   --   ALBUMIN 4.2 4.0  --  4.0  --  3.6  --   --   AST 24 25  --  22  --  14*  --   --   ALT 20 16*  --  18  --  12*  --   --   ALKPHOS 49 42  --  54  --  50  --   --   BILITOT 0.5 0.6  --  0.4  --  0.8  --   --   BILIDIR <0.1*  --   --   --   --   --   --   --   IBILI NOT CALCULATED  --   --   --   --   --   --   --   < > = values in this interval not displayed.  RADIOGRAPHIC STUDIES: I have personally reviewed the radiological images as listed and agreed with the findings in the report. Ct Soft Tissue Neck W Contrast  04/06/2016  CLINICAL DATA:  75 year old male with right neck pain and swelling since 04/03/2016. Lung cancer post chemotherapy. Left vocal cord surgery 4  months ago. Initial encounter. EXAM: CT NECK WITH CONTRAST TECHNIQUE: Multidetector CT imaging of the neck was performed using the standard protocol following the bolus administration of intravenous contrast. CONTRAST:  42m ISOVUE-300 IOPAMIDOL (ISOVUE-300) INJECTION 61% COMPARISON:  07/19/2011 neck CT. 01/17/2016 brain MR. 01/17/2016 PET CT. FINDINGS: Pharynx and larynx: Mild impression upon the right aspect of the palatine tonsil/ pharynx by the inflamed enlarged right internal jugular vein. Prominence of the palatine tonsils and lingual tonsils without definitive mass identified. Marked narrowing supraglottic airway. Teflon left vocal cord. Heterogeneous appearance of the thyroid cartilage similar to prior exam. Salivary glands: No primary worrisome mass. Thyroid: No worrisome dominant mass. Lymph nodes: Interval enlargement of lymph node posterior right submandibular gland immediately anterior to the thrombosed inflamed right internal jugular vein may be reactive in origin. Attention to this on follow-up. Previously noted left level 4 and level 5 malignant adenopathy has decreased in size now measuring up to 1.5 cm versus prior 2.5 cm. Vascular: New from the 01/17/2016 brain  MR is thrombosis of the right internal jugular vein. This extends from the level of the skull base (right jugular fossa) throughout the neck to level of the right innominate vein. A right central port is in place entering the thrombosed right internal jugular vein with the tip not imaged on the current exam. The thrombosed right internal jugular vein is enlarged and inflamed with adjacent reactive changes. Right carotid artery displaced by a thrombosed right internal jugular vein. 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 Moderate narrowing left common carotid artery. Left carotid  bifurcation calcifications with less than 60% diameter stenosis. Aortic atherosclerosis. Limited intracranial: No enhancing lesion. Visualized orbits: Negative. Mastoids and visualized paranasal sinuses: Clear. 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 Upper chest: Left upper lobe consolidation consistent with post therapy changes. The previously noted nodular components along the anterior and posterior aspect of the left thorax are less well delineated on current exam however, right upper lobe nodular density has enlarged slightly now with maximal transverse dimension of 1.6 cm versus prior 1.3 cm. 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 Port enters this thrombosed vessel. The vessel is enlarged and inflamed. Patient is at risk for right sigmoid sinus thrombosis. Decrease size of left level 4 and 5 lymph nodes and nodularity left upper lung. Slight increase in size of right nodularity (current 1.6 cm versus prior 1.3 cm). New enlarged lymph node posterior right submandibular gland may be reactive in origin given the adjacent thrombosed vessel rather than metastatic in origin. Attention to this on follow-up. Aortic atherosclerosis 66% diameter stenosis proximal right internal carotid artery and moderate narrowing just below the skullbase. Moderate narrowing left common carotid artery. Left carotid bifurcation calcifications with less than 60% diameter stenosis. These results were called by telephone at the time of interpretation on 04/06/2016 at 4:34 pm to Dr. Charlaine Dalton , who verbally acknowledged these results. Electronically Signed   By: Genia Del M.D.   On: 04/06/2016 16:59   Ct Chest W Contrast  03/30/2016  CLINICAL DATA:  Restaging small cell lung cancer post 2 rounds a chemotherapy and radiation therapy. History of left upper lobe lung  cancer in 2009. EXAM: CT CHEST WITH CONTRAST TECHNIQUE: Multidetector CT imaging of the chest was performed during intravenous contrast administration. CONTRAST:  52m ISOVUE-300 IOPAMIDOL (ISOVUE-300) INJECTION 61% COMPARISON:  Chest CT 01/09/2016.  PET-CT 01/17/2016. FINDINGS: Cardiovascular: There is extensive atherosclerosis of the aorta, great vessels and coronary arteries. There is probable ostial stenosis at the origin of the right brachiocephalic artery. The pulmonary arteries appear normal. No acute vascular findings are seen. The heart size is normal. There is no pericardial effusion. Mediastinum/Nodes: The previously demonstrated level 4 and 5 lower cervical adenopathy has resolved. The previous demonstrated hypermetabolic mediastinal and hilar adenopathy has improved. Left superior mediastinal node measures 9 mm on image 25, previously 17 mm. A right hilar node measures 12 mm on image 68, previously 16 mm. The left cardiophrenic angle node has also decreased in size, measuring 9 mm on image 124. There is no residual axillary adenopathy. The thyroid gland, trachea and esophagus demonstrate no significant findings. Lungs/Pleura: There is no pleural effusion. The previously demonstrated mass anteriorly in the left upper lobe is significantly smaller, although partly obscured by adjacent radiation changes.  There is no discrete measurable lesion in this area. The other left-sided nodules have also resolved. The underlying radiation changes in the left upper lobe are stable. The right upper lobe pulmonary nodule measures 14 x 10 mm on image 47 (previously 13 x 9 mm). This right-sided nodule has been present on CTs dating back to 2013 and did not appear hypermetabolic on PET-CT. There is stable underlying chronic lung disease with emphysema and subpleural reticulation. Upper abdomen: The visualized upper abdomen appears stable with stable bilateral adrenal adenomas. There are no worrisome hepatic findings.  Musculoskeletal/Chest wall: There is some periosteal thickening of the left first and second ribs, likely related to radiation therapy. No bone destruction or pathologic fracture identified. IMPRESSION: 1. Significant response to interval therapy with near-complete resolution of large hypermetabolic left upper lobe pulmonary mass, lower cervical, mediastinal and left axillary lymphadenopathy. 2. Underlying radiation changes in the left upper lobe are again noted with associated sclerosis of the left first and second ribs. 3. The additional left lung nodularity has also resolved. A right upper lobe pulmonary nodule appears minimally larger, although has been present since 2013, likely benign. Electronically Signed   By: Richardean Sale M.D.   On: 03/30/2016 09:40    ASSESSMENT & PLAN:   # 75 year old male patient with history of extensive stage small cell lung cancer- currently on chemotherapy; is admitted to the hospital for right neck swelling/pain-noted to have extensive DVT of the right IJ  # Extensive DVT of the right IJ- multifactorial- underlying malignancy/Mediport/chemotherapy- Symptomatically improved since being on IV heparin. Patient had the port explanted yesterday. Given the significant thrombosis/inflammation- reasonable to keep the patient on IV heparin for 1 more day. And if patient continues to make symptomatic improvement-  Recommend oral anticoagulation with xarelto or eliquis.   # Extensive stage small cell lung cancer- status post 2 cycles of carboplatin etoposide; restaging CAT scan shows - significant improvement. Patient will need a total of 4 cycles of chemotherapy. We will plan chemotherapy through peripheral IV access.   Thank you Dr.Konidena for allowing me to participate in the care of your pleasant patient. Please do not hesitate to contact me with questions or concerns in the interim. Also discussed with Dr.Konidena.   All questions were answered. The patient knows to call  the clinic with any problems, questions or concerns.    Cammie Sickle, MD 04/08/2016 12:51 PM

## 2016-04-08 NOTE — Progress Notes (Signed)
ANTICOAGULATION CONSULT NOTE - Follow Up Consult  Pharmacy Consult for Heparin Indication: VTE treatment- stenosis on carotid scan  Allergies  Allergen Reactions  . Clarithromycin Nausea And Vomiting  . Penicillins Nausea And Vomiting, Rash and Other (See Comments)    Has patient had a PCN reaction causing immediate rash, facial/tongue/throat swelling, SOB or lightheadedness with hypotension: No Has patient had a PCN reaction causing severe rash involving mucus membranes or skin necrosis: No Has patient had a PCN reaction that required hospitalization No Has patient had a PCN reaction occurring within the last 10 years: No If all of the above answers are "NO", then may proceed with Cephalosporin use.    Patient Measurements: Height: '5\' 9"'$  (175.3 cm) Weight: 182 lb 1.6 oz (82.6 kg) IBW/kg (Calculated) : 70.7 Heparin Dosing Weight: 82.6 kg  Vital Signs: Temp: 99.3 F (37.4 C) (07/02 0459) Temp Source: Oral (07/02 0459) BP: 134/68 mmHg (07/02 0459) Pulse Rate: 85 (07/02 0459)  Labs:  Recent Labs  04/06/16 1800  04/07/16 0849 04/07/16 1043 04/07/16 1907 04/08/16 0447  HGB 12.3*  --  11.8*  --   --  11.4*  HCT 37.1*  --  33.0*  --   --  32.6*  PLT 283  --  262  --   --  268  APTT 35  --   --   --   --   --   LABPROT 14.3  --   --   --   --   --   INR 1.09  --   --   --   --   --   HEPARINUNFRC  --   < > 0.38 0.37 0.44 0.33  CREATININE 0.84  --  0.78  --   --   --   < > = values in this interval not displayed.  Estimated Creatinine Clearance: 81 mL/min (by C-G formula based on Cr of 0.78).   Medications:  Infusions:  . heparin 1,500 Units/hr (04/08/16 0341)    Assessment: Patient currently ordered Heparin 1350 units/hr infusion.  7/1 02:30 Heparin level resulted at 0.24  Goal of Therapy:  Heparin level 0.3-0.7 units/ml Monitor platelets by anticoagulation protocol: Yes   Plan:   Will bolus with Heparin 1200 units IV then increase drip rate t 1500 unit/hr.  Will check next Heparin level in 6hr at 11:00.  7/1 Heparin level at 1043= 0.37. Will check confirmatory level in 8 hrs at 1830.  7/1 PM: HL= 0.44. Will f/u AM labs.   7/2 AM: HL = 0.33 (therapeutic). Will recheck HL and CBC with AM labs tomorrow.  Lenis Noon, PharmD Clinical Pharmacist 04/08/2016  5:39 AM

## 2016-04-09 ENCOUNTER — Other Ambulatory Visit: Payer: Self-pay | Admitting: Internal Medicine

## 2016-04-09 ENCOUNTER — Telehealth: Payer: Self-pay | Admitting: *Deleted

## 2016-04-09 ENCOUNTER — Encounter: Payer: Self-pay | Admitting: *Deleted

## 2016-04-09 ENCOUNTER — Encounter: Payer: Self-pay | Admitting: Surgery

## 2016-04-09 LAB — CBC
HCT: 33.5 % — ABNORMAL LOW (ref 40.0–52.0)
HEMOGLOBIN: 11.5 g/dL — AB (ref 13.0–18.0)
MCH: 28.5 pg (ref 26.0–34.0)
MCHC: 34.5 g/dL (ref 32.0–36.0)
MCV: 82.6 fL (ref 80.0–100.0)
Platelets: 283 10*3/uL (ref 150–440)
RBC: 4.05 MIL/uL — AB (ref 4.40–5.90)
RDW: 16.4 % — ABNORMAL HIGH (ref 11.5–14.5)
WBC: 7.2 10*3/uL (ref 3.8–10.6)

## 2016-04-09 LAB — GLUCOSE, CAPILLARY
GLUCOSE-CAPILLARY: 130 mg/dL — AB (ref 65–99)
Glucose-Capillary: 138 mg/dL — ABNORMAL HIGH (ref 65–99)

## 2016-04-09 LAB — HEPARIN LEVEL (UNFRACTIONATED): HEPARIN UNFRACTIONATED: 0.21 [IU]/mL — AB (ref 0.30–0.70)

## 2016-04-09 MED ORDER — LEVOFLOXACIN 500 MG PO TABS: 500.0000 mg | ORAL_TABLET | Freq: Every day | ORAL | Status: DC

## 2016-04-09 MED ORDER — HEPARIN (PORCINE) IN NACL 100-0.45 UNIT/ML-% IJ SOLN
1650.0000 [IU]/h | INTRAMUSCULAR | Status: DC
  Administered 2016-04-09: 06:00:00 1650 [IU]/h via INTRAVENOUS
  Filled 2016-04-09 (×2): qty 250

## 2016-04-09 MED ORDER — RIVAROXABAN (XARELTO) VTE STARTER PACK (15 & 20 MG): ORAL_TABLET | ORAL | Status: DC

## 2016-04-09 MED ORDER — ENOXAPARIN SODIUM 100 MG/ML ~~LOC~~ SOLN
1.0000 mg/kg | Freq: Two times a day (BID) | SUBCUTANEOUS | Status: DC
  Administered 2016-04-09: 12:00:00 85 mg via SUBCUTANEOUS
  Filled 2016-04-09: qty 1

## 2016-04-09 MED ORDER — HEPARIN BOLUS VIA INFUSION
1250.0000 [IU] | Freq: Once | INTRAVENOUS | Status: AC
  Administered 2016-04-09: 06:00:00 1250 [IU] via INTRAVENOUS
  Filled 2016-04-09: qty 1250

## 2016-04-09 MED ORDER — ALBUTEROL SULFATE (2.5 MG/3ML) 0.083% IN NEBU
2.5000 mg | INHALATION_SOLUTION | Freq: Three times a day (TID) | RESPIRATORY_TRACT | Status: DC
  Administered 2016-04-09: 07:00:00 2.5 mg via RESPIRATORY_TRACT
  Filled 2016-04-09 (×2): qty 3

## 2016-04-09 MED ORDER — ENOXAPARIN SODIUM 150 MG/ML ~~LOC~~ SOLN: 1.0000 mg/kg | Freq: Two times a day (BID) | SUBCUTANEOUS | Status: DC

## 2016-04-09 NOTE — Telephone Encounter (Signed)
Per Dr Rogue Bussing, Rx faxed to [facility] for [facility]. Left msg on VM for Thayer Headings of this and that I have contacted Mervin Kung NCM from hospital and she will see if there is anything she can do to help patient

## 2016-04-09 NOTE — Telephone Encounter (Signed)
RN spoke with asher mcadams.  copay for lovenox dosing is over 600.  I asked about the cost of xarelto and/or eliquis. copay is over 749 for xarelto starter pack. Per pharmacy-eliquis will be just about as expensive for patient.  RN spoke with Dr. Rogue Bussing. V/o to obtain samples from [facility] pharmacy for xarelto starter pack-dispense 1.  Hassan Rowan, RN spoke with Myra in pharmacy. Ok to dispense this drug to patient.  Patient notified to come into cancer center asap for teaching for xarelto starter pack and to dispense this rx to patient.  Patient came to cancer center by 1750 to pick up samples. RN educated patient on xarelto. MD dispensed xarelto  Starter pack for : xarelto 15 mg 1 tablet twice daily with food x 21 days and 20 mg daily starting on day 22.  Pt/pt's wife provided pt education information on this drug. Teach back process performed with patient and caregiver. Questions were answered to patient's satisfaction per patient. Wife stated that she has 4 lovenox rx syringes that she bought today. I asked the patient not take the lovenox per Dr. Aletha Halim instructions. Pt counseled on fall risk and bleeding percautions with xarelto. I explained to the patient that there is currently no antidote for xarelto.   In regards to pain management, Dr. Rogue Bussing instructed pt to continue taking his tramadol as needed for pain.  Teach back process performed again with patient. He expressed gratitude for providing the samples of xarelto and taking the time for educating him on this drug.

## 2016-04-09 NOTE — Discharge Summary (Signed)
Richard Jimenez, is a 75 y.o. male  DOB 11/14/1940  MRN [id number].  Admission date:  04/06/2016  Admitting Physician  Theodoro Grist, MD  Discharge Date:  04/09/2016   Primary MD  Madelyn Brunner, MD  Recommendations for primary care physician for things to follow:   Follow-up with primary doctor in 1 week, follow-up with oncologist Dr. Judyann Munson 1 week   Admission Diagnosis  Jugular vein thrombosis, right [I82.890]   Discharge Diagnosis  Jugular vein thrombosis, right [I82.890]    Principal Problem:   Internal jugular vein thrombosis (HCC) Active Problems:   Neck pain   Hyponatremia   Anemia   Jugular vein thrombosis, right      Past Medical History  Diagnosis Date  . Myocardial infarct ([place])   . COPD (chronic obstructive pulmonary disease) ([place])   . Hypertension   . Diabetes mellitus without complication ([place])   . Asthma   . Lung cancer, upper lobe ([place]) 2009    Past Surgical History  Procedure Laterality Date  . Throat surgery    . Tumor removal Right     "years ago-right lower posterior side"  . Peripheral vascular catheterization N/A 02/21/2016    Procedure: Glori Luis Cath Insertion;  Surgeon: Algernon Huxley, MD;  Location: [facility];  Service: Cardiovascular;  Laterality: N/A;  . Port-a-cath removal N/A 04/07/2016    Procedure: REMOVAL PORT-A-CATH;  Surgeon: Serafina Mitchell, MD;  Location: [facility];  Service: Vascular;  Laterality: N/A;       History of present illness and  Hospital Course:     Kindly see H&P for history of present illness and admission details, please review complete Labs, Consult reports and Test reports for all details in brief  HPI  from the history and physical done on the day of admission 75 year old male patient with newly diagnosed small cell cancer of the lung  getting chemotherapy found to have right-sided neck pain, swelling. Patient had a CT scan of the neck showing significant DVT of the right IJ in from jugular. The innominate vein. Admitted to hospitalist service for full dose anticoagulation, removing the Port-A-Cath placed which is on the right side of the chest   Hospital Course  #1 extensive DVT of the right IJ causing redness, swelling and the pain: Underlying cause is malignancy, port related. Patient Port-A-Cath was removed from the right side chest wall. And started on IV heparin drip, patient was on heparin drip for 2 days. I discussed the further plan with oncologist, he recommended starting the Lovenox 1 mg per KG every 12 hours, discharge him with that. Patient will be given education about Lovenox injections. As with Lovenox only. Patient will see oncologist as an outpatient, patient needs future the new placement of the port, possibly starting full anticoagulation orally after the port placement and ongoing chemotherapy. 2. Right-sided thrombophlebitis: Due to DVT: Started on vancomycin for possible the cellulitis as well. Patient will be discharged home with Levaquin. Patient can continue 5 days. #3. 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 #3. history of COPD: Patient can continue inhalers. #4 .history of diabetes mellitus type 2: Her home medication with metformin.    Discharge Condition: stable  Follow UP   follow up with oncology Dr.Brahmanday in one week   Discharge Instructions  and  Discharge Medications        Medication List    STOP taking  these medications        cephALEXin 500 MG capsule  Commonly known as:  KEFLEX      TAKE these medications        enoxaparin 150 MG/ML injection  Commonly known as:  LOVENOX  Inject 0.55 mLs (85 mg total) into the skin every 12 (twelve) hours.     levofloxacin 500 MG  tablet  Commonly known as:  LEVAQUIN  Take 1 tablet (500 mg total) by mouth daily.     lidocaine-prilocaine cream  Commonly known as:  EMLA  Apply 1 application topically as needed (prior to accessing port).     magic mouthwash Soln  Take 5 mLs by mouth 4 (four) times daily as needed for mouth pain.     metFORMIN 1000 MG tablet  Commonly known as:  GLUCOPHAGE  Take 1,000 mg by mouth 2 (two) times daily with a meal.     ondansetron 8 MG tablet  Commonly known as:  ZOFRAN  Take 8 mg by mouth every 8 (eight) hours as needed for nausea or vomiting.     PROAIR HFA 108 (90 Base) MCG/ACT inhaler  Generic drug:  albuterol  Inhale 2 puffs into the lungs every 6 (six) hours as needed for wheezing or shortness of breath.     albuterol (2.5 MG/3ML) 0.083% nebulizer solution  Commonly known as:  PROVENTIL  Inhale 3 mLs into the lungs every 6 (six) hours as needed for wheezing or shortness of breath.     prochlorperazine 10 MG tablet  Commonly known as:  COMPAZINE  Take 1 tablet (10 mg total) by mouth every 6 (six) hours as needed for nausea or vomiting.     traMADol 50 MG tablet  Commonly known as:  ULTRAM  Take 50 mg by mouth every 6 (six) hours as needed for moderate pain.          Diet and Activity recommendation: See Discharge Instructions above   Consults obtained - Oncology consult   Major procedures and Radiology Reports - PLEASE review detailed and final reports for all details, in brief -     Ct Soft Tissue Neck W Contrast  04/06/2016  CLINICAL DATA:  75 year old male with right neck pain and swelling since 04/03/2016. Lung cancer post chemotherapy. Left vocal cord surgery 4 months ago. Initial encounter. EXAM: CT NECK WITH CONTRAST TECHNIQUE: Multidetector CT imaging of the neck was performed using the standard protocol following the bolus administration of intravenous contrast. CONTRAST:  27m ISOVUE-300 IOPAMIDOL (ISOVUE-300) INJECTION 61% COMPARISON:  07/19/2011  neck CT. 01/17/2016 brain MR. 01/17/2016 PET CT. FINDINGS: Pharynx and larynx: Mild impression upon the right aspect of the palatine tonsil/ pharynx by the inflamed enlarged right internal jugular vein. Prominence of the palatine tonsils and lingual tonsils without definitive mass identified. Marked narrowing supraglottic airway. Teflon left vocal cord. Heterogeneous appearance of the thyroid cartilage similar to prior exam. Salivary glands: No primary worrisome mass. Thyroid: No worrisome dominant mass. Lymph nodes: Interval enlargement of lymph node posterior right submandibular gland immediately anterior to the thrombosed inflamed right internal jugular vein may be reactive in origin. Attention to this on follow-up. Previously noted left level 4 and level 5 malignant adenopathy has decreased in size now measuring up to 1.5 cm versus prior 2.5 cm. Vascular: New from the 01/17/2016 brain MR is thrombosis of the right internal jugular vein. This extends from the level of the skull base (right jugular fossa) throughout the neck to level of the right innominate  vein. A right central port is in place entering the thrombosed right internal jugular vein with the tip not imaged on the current exam. The thrombosed right internal jugular vein is enlarged and inflamed with adjacent reactive changes. Right carotid artery displaced by a thrombosed right internal jugular vein. 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 Moderate narrowing left common carotid artery. Left carotid bifurcation calcifications with less than 60% diameter stenosis. Aortic atherosclerosis. Limited intracranial: No enhancing lesion. Visualized orbits: Negative. Mastoids and visualized paranasal sinuses: Clear. Skeleton: Cervical spondylotic changes with various degrees of spinal stenosis and foraminal  narrowing C3-4 thru C6-7. Upper chest: Left upper lobe consolidation consistent with post therapy changes. The previously noted nodular components along the anterior and posterior aspect of the left thorax are less well delineated on current exam however, right upper lobe nodular density has enlarged slightly now with maximal transverse dimension of 1.6 cm versus prior 1.3 cm. 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 Port enters this thrombosed vessel. The vessel is enlarged and inflamed. Patient is at risk for right sigmoid sinus thrombosis. Decrease size of left level 4 and 5 lymph nodes and nodularity left upper lung. Slight increase in size of right nodularity (current 1.6 cm versus prior 1.3 cm). New enlarged lymph node posterior right submandibular gland may be reactive in origin given the adjacent thrombosed vessel rather than metastatic in origin. Attention to this on follow-up. Aortic atherosclerosis 66% diameter stenosis proximal right internal carotid artery and moderate narrowing just below the skullbase. Moderate narrowing left common carotid artery. Left carotid bifurcation calcifications with less than 60% diameter stenosis. These results were called by telephone at the time of interpretation on 04/06/2016 at 4:34 pm to Dr. Charlaine Dalton , who verbally acknowledged these results. Electronically Signed   By: Genia Del M.D.   On: 04/06/2016 16:59   Ct Chest W Contrast  03/30/2016  CLINICAL DATA:  Restaging small cell lung cancer post 2 rounds a chemotherapy and radiation therapy. History of left upper lobe lung cancer in 2009. EXAM: CT CHEST WITH CONTRAST TECHNIQUE: Multidetector CT imaging of the chest was performed during intravenous contrast administration. CONTRAST:  63m ISOVUE-300 IOPAMIDOL (ISOVUE-300) INJECTION 61% COMPARISON:  Chest CT 01/09/2016.  PET-CT 01/17/2016. FINDINGS: Cardiovascular: There is  extensive atherosclerosis of the aorta, great vessels and coronary arteries. There is probable ostial stenosis at the origin of the right brachiocephalic artery. The pulmonary arteries appear normal. No acute vascular findings are seen. The heart size is normal. There is no pericardial effusion. Mediastinum/Nodes: The previously demonstrated level 4 and 5 lower cervical adenopathy has resolved. The previous demonstrated hypermetabolic mediastinal and hilar adenopathy has improved. Left superior mediastinal node measures 9 mm on image 25, previously 17 mm. A right hilar node measures 12 mm on image 68, previously 16 mm. The left cardiophrenic angle node has also decreased in size, measuring 9 mm on image 124. There is no residual axillary adenopathy. The thyroid gland, trachea and esophagus demonstrate no significant findings. Lungs/Pleura: There is no pleural effusion. The previously demonstrated mass anteriorly in the left upper lobe is significantly smaller, although partly obscured by adjacent radiation changes. There is no discrete measurable lesion in this area. The other left-sided nodules have also resolved. The underlying radiation changes in the left upper lobe are stable. The right upper  lobe pulmonary nodule measures 14 x 10 mm on image 47 (previously 13 x 9 mm). This right-sided nodule has been present on CTs dating back to 2013 and did not appear hypermetabolic on PET-CT. There is stable underlying chronic lung disease with emphysema and subpleural reticulation. Upper abdomen: The visualized upper abdomen appears stable with stable bilateral adrenal adenomas. There are no worrisome hepatic findings. Musculoskeletal/Chest wall: There is some periosteal thickening of the left first and second ribs, likely related to radiation therapy. No bone destruction or pathologic fracture identified. IMPRESSION: 1. Significant response to interval therapy with near-complete resolution of large hypermetabolic left  upper lobe pulmonary mass, lower cervical, mediastinal and left axillary lymphadenopathy. 2. Underlying radiation changes in the left upper lobe are again noted with associated sclerosis of the left first and second ribs. 3. The additional left lung nodularity has also resolved. A right upper lobe pulmonary nodule appears minimally larger, although has been present since 2013, likely benign. Electronically Signed   By: Richardean Sale M.D.   On: 03/30/2016 09:40    Micro Results   Recent Results (from the past 240 hour(s))  CULTURE, BLOOD (ROUTINE X 2) w Reflex to ID Panel     Status: None (Preliminary result)   Collection Time: 04/06/16  9:39 PM  Result Value Ref Range Status   Specimen Description BLOOD LEFT ASSIST CONTROL  Final   Special Requests BOTTLES DRAWN AEROBIC AND ANAEROBIC 10CC  Final   Culture NO GROWTH 2 DAYS  Final   Report Status PENDING  Incomplete  CULTURE, BLOOD (ROUTINE X 2) w Reflex to ID Panel     Status: None (Preliminary result)   Collection Time: 04/06/16  9:39 PM  Result Value Ref Range Status   Specimen Description BLOOD LEFT HAND  Final   Special Requests BOTTLES DRAWN AEROBIC AND ANAEROBIC 10CC  Final   Culture NO GROWTH 2 DAYS  Final   Report Status PENDING  Incomplete       Today   Subjective:   Richard Jimenez today has no headache,no chest abdominal pain,no new weakness tingling or numbness, feels much better wants to go home today.  Objective:   Blood pressure 141/72, pulse 87, temperature 98.5 F (36.9 C), temperature source Oral, resp. rate 18, height '5\' 9"'$  (1.753 m), weight 82.6 kg (182 lb 1.6 oz), SpO2 96 %.   Intake/Output Summary (Last 24 hours) at 04/09/16 0831 Last data filed at 04/08/16 1800  Gross per 24 hour  Intake    400 ml  Output    300 ml  Net    100 ml    Exam Awake Alert, Oriented x 3, No new F.N deficits, Normal affect [place].AT,PERRAL Supple Neck,No JVD, No cervical lymphadenopathy appriciated.  Symmetrical Chest wall  movement, Good air movement bilaterally, CTAB RRR,No Gallops,Rubs or new Murmurs, No Parasternal Heave +ve B.Sounds, Abd Soft, Non tender, No organomegaly appriciated, No rebound -guarding or rigidity. No Cyanosis, Clubbing or edema, No new Rash or bruise  Data Review   CBC w Diff: Lab Results  Component Value Date   WBC 7.2 04/09/2016   WBC 6.9 02/26/2014   HGB 11.5* 04/09/2016   HGB 15.1 02/26/2014   HCT 33.5* 04/09/2016   HCT 44.3 02/26/2014   PLT 283 04/09/2016   PLT 164 02/26/2014   LYMPHOPCT 11 04/06/2016   LYMPHOPCT 32.5 02/26/2014   MONOPCT 14 04/06/2016   MONOPCT 8.2 02/26/2014   EOSPCT 1 04/06/2016   EOSPCT 2.7 02/26/2014   BASOPCT 1  04/06/2016   BASOPCT 1.1 02/26/2014    CMP: Lab Results  Component Value Date   NA 136 04/07/2016   K 4.0 04/07/2016   CL 100* 04/07/2016   CO2 28 04/07/2016   BUN 10 04/07/2016   CREATININE 0.78 04/08/2016   CREATININE 0.99 02/26/2014   PROT 8.0 04/04/2016   PROT 7.3 02/26/2014   ALBUMIN 3.6 04/04/2016   ALBUMIN 3.4 02/26/2014   BILITOT 0.8 04/04/2016   BILITOT 0.4 02/26/2014   ALKPHOS 50 04/04/2016   ALKPHOS 54 02/26/2014   AST 14* 04/04/2016   AST 17 02/26/2014   ALT 12* 04/04/2016   ALT 25 02/26/2014  .   Total Time in preparing paper work, data evaluation and todays exam - 56 minutes  Arden Axon M.D on 04/09/2016 at 8:31 AM    Note: This dictation was prepared with Dragon dictation along with smaller phrase technology. Any transcriptional errors that result from this process are unintentional.

## 2016-04-09 NOTE — Care Management Note (Addendum)
Case Management Note  Patient Details  Name: Richard Jimenez MRN: [id number] Date of Birth: 09-16-1941  Subjective/Objective:    Received a call today at [facility] reporting that [name] was discharged today with a prescription for Lovenox he was told by his pharmacy, [facility], that it would cost approximately $600. [name] has Medicare [employer]&B and [employer]. The pharmacist at [facility] reports that [name] has no Medicare Part D on file. Meanwhile Dr Rogue Bussing is calling [facility] asking about [name]. Per the pharmacist, Dr Rogue Bussing reported that he was going to supply samples of [name] for [name] and have the family pick them up. This Probation officer updated the [employer] via text message.             Action/Plan:   Expected Discharge Date:                  Expected Discharge Plan:     In-House Referral:     Discharge planning Services     Post Acute Care Choice:    Choice offered to:     DME Arranged:    DME Agency:     HH Arranged:    HH Agency:     Status of Service:     If discussed at [employer] of Stay Meetings, dates discussed:    Additional Comments:  Dilana Mcphie A, RN 04/09/2016, 5:39 PM

## 2016-04-09 NOTE — Telephone Encounter (Signed)
Cannot afford copay on Rx given of $600 asking that something else be ordered

## 2016-04-09 NOTE — Progress Notes (Addendum)
Gave patient and wife  lovenox teaching prior to discharge along with providing lovenox teaching kit, reviewed home meds prescriptionas and discharge instructions with patient and wife and both verbalized understanding discharged to home in wheelchair with nursing staff and wife

## 2016-04-09 NOTE — Progress Notes (Unsigned)
xarelto  Rivaroxaban oral tablets What is this medicine? RIVAROXABAN (ri va ROX a ban) is an anticoagulant (blood thinner). It is used to treat blood clots in the lungs or in the veins. It is also used after knee or hip surgeries to prevent blood clots. It is also used to lower the chance of stroke in people with a medical condition called atrial fibrillation. This medicine may be used for other purposes; ask your health care provider or pharmacist if you have questions. What should I tell my health care provider before I take this medicine? They need to know if you have any of these conditions: -bleeding disorders -bleeding in the brain -blood in your stools (black or tarry stools) or if you have blood in your vomit -history of stomach bleeding -kidney disease -liver disease -low blood counts, like low white cell, platelet, or red cell counts -recent or planned spinal or epidural procedure -take medicines that treat or prevent blood clots -an unusual or allergic reaction to rivaroxaban, other medicines, foods, dyes, or preservatives -pregnant or trying to get pregnant -breast-feeding How should I use this medicine? Take this medicine by mouth with a glass of water. Follow the directions on the prescription label. Take your medicine at regular intervals. Do not take it more often than directed. Do not stop taking except on your doctor's advice. Stopping this medicine may increase your risk of a blood clot. Be sure to refill your prescription before you run out of medicine. If you are taking this medicine after hip or knee replacement surgery, take it with or without food. If you are taking this medicine for atrial fibrillation, take it with your evening meal. If you are taking this medicine to treat blood clots, take it with food at the same time each day. If you are unable to swallow your tablet, you may crush the tablet and mix it in applesauce. Then, immediately eat the applesauce. You  should eat more food right after you eat the applesauce containing the crushed tablet. Talk to your pediatrician regarding the use of this medicine in children. Special care may be needed. Overdosage: If you think you have taken too much of this medicine contact a poison control center or emergency room at once. NOTE: This medicine is only for you. Do not share this medicine with others. What if I miss a dose? If you take your medicine once a day and miss a dose, take the missed dose as soon as you remember. If you take your medicine twice a day and miss a dose, take the missed dose immediately. In this instance, 2 tablets may be taken at the same time. The next day you should take 1 tablet twice a day as directed. What may interact with this medicine? -aspirin and aspirin-like medicines -certain antibiotics like erythromycin, azithromycin, and clarithromycin -certain medicines for fungal infections like ketoconazole and itraconazole -certain medicines for irregular heart beat like amiodarone, quinidine, dronedarone -certain medicines for seizures like carbamazepine, phenytoin -certain medicines that treat or prevent blood clots like warfarin, enoxaparin, and dalteparin -conivaptan -diltiazem -felodipine -indinavir -lopinavir; ritonavir -NSAIDS, medicines for pain and inflammation, like ibuprofen or naproxen -ranolazine -rifampin -ritonavir -St. John's wort -verapamil This list may not describe all possible interactions. Give your health care provider a list of all the medicines, herbs, non-prescription drugs, or dietary supplements you use. Also tell them if you smoke, drink alcohol, or use illegal drugs. Some items may interact with your medicine. What should I watch for while  using this medicine? Visit your doctor or health care professional for regular checks on your progress. Your condition will be monitored carefully while you are receiving this medicine. Notify your doctor or health  care professional and seek emergency treatment if you develop breathing problems; changes in vision; chest pain; severe, sudden headache; pain, swelling, warmth in the leg; trouble speaking; sudden numbness or weakness of the face, arm, or leg. These can be signs that your condition has gotten worse. If you are going to have surgery, tell your doctor or health care professional that you are taking this medicine. Tell your health care professional that you use this medicine before you have a spinal or epidural procedure. Sometimes people who take this medicine have bleeding problems around the spine when they have a spinal or epidural procedure. This bleeding is very rare. If you have a spinal or epidural procedure while on this medicine, call your health care professional immediately if you have back pain, numbness or tingling (especially in your legs and feet), muscle weakness, paralysis, or loss of bladder or bowel control. Avoid sports and activities that might cause injury while you are using this medicine. Severe falls or injuries can cause unseen bleeding. Be careful when using sharp tools or knives. Consider using an Copy. Take special care brushing or flossing your teeth. Report any injuries, bruising, or red spots on the skin to your doctor or health care professional. What side effects may I notice from receiving this medicine? Side effects that you should report to your doctor or health care professional as soon as possible: -allergic reactions like skin rash, itching or hives, swelling of the face, lips, or tongue -back pain -redness, blistering, peeling or loosening of the skin, including inside the mouth -signs and symptoms of bleeding such as bloody or black, tarry stools; red or dark-brown urine; spitting up blood or brown material that looks like coffee grounds; red spots on the skin; unusual bruising or bleeding from the eye, gums, or nose Side effects that usually do not  require medical attention (Report these to your doctor or health care professional if they continue or are bothersome.): -dizziness -muscle pain This list may not describe all possible side effects. Call your doctor for medical advice about side effects. You may report side effects to FDA at 1-800-FDA-1088. Where should I keep my medicine? Keep out of the reach of children. Store at room temperature between 15 and 30 degrees C (59 and 86 degrees F). Throw away any unused medicine after the expiration date. NOTE: This sheet is a summary. It may not cover all possible information. If you have questions about this medicine, talk to your doctor, pharmacist, or health care provider.    2016, [employer]vier/[employer] Standard. (2014-09-22 12:45:34) our doctor for medical advice about side effects. You may report side effects to FDA at 1-800-FDA-1088. Where should I keep my medicine? Keep out of the reach of children. Store at room temperature between 15 and 30 degrees C (59 and 86 degrees F). Throw away any unused medicine after the expiration date. NOTE: This sheet is a summary. It may not cover all possible information. If you have questions about this medicine, talk to your doctor, pharmacist, or health care provider.    2016, Elsevier/[employer] Standard. (2014-12-01 22:09:40)

## 2016-04-09 NOTE — Progress Notes (Signed)
ANTICOAGULATION CONSULT NOTE - Follow Up Consult  Pharmacy Consult for Heparin Indication: VTE treatment- stenosis on carotid scan  Allergies  Allergen Reactions  . Clarithromycin Nausea And Vomiting  . Penicillins Nausea And Vomiting, Rash and Other (See Comments)    Has patient had a PCN reaction causing immediate rash, facial/tongue/throat swelling, SOB or lightheadedness with hypotension: No Has patient had a PCN reaction causing severe rash involving mucus membranes or skin necrosis: No Has patient had a PCN reaction that required hospitalization No Has patient had a PCN reaction occurring within the last 10 years: No If all of the above answers are "NO", then may proceed with Cephalosporin use.    Patient Measurements: Height: '5\' 9"'$  (175.3 cm) Weight: 182 lb 1.6 oz (82.6 kg) IBW/kg (Calculated) : 70.7 Heparin Dosing Weight: 82.6 kg  Vital Signs: Temp: 98.5 F (36.9 C) (07/03 0435) Temp Source: Oral (07/03 0435) BP: 141/72 mmHg (07/03 0435) Pulse Rate: 87 (07/03 0435)  Labs:  Recent Labs  04/06/16 1800  04/07/16 0849  04/07/16 1907 04/08/16 0447 04/08/16 1530 04/09/16 0434  HGB 12.3*  --  11.8*  --   --  11.4*  --  11.5*  HCT 37.1*  --  33.0*  --   --  32.6*  --  33.5*  PLT 283  --  262  --   --  268  --  283  APTT 35  --   --   --   --   --   --   --   LABPROT 14.3  --   --   --   --   --   --   --   INR 1.09  --   --   --   --   --   --   --   HEPARINUNFRC  --   < > 0.38  < > 0.44 0.33  --  0.21*  CREATININE 0.84  --  0.78  --   --   --  0.78  --   < > = values in this interval not displayed.  Estimated Creatinine Clearance: 81 mL/min (by C-G formula based on Cr of 0.78).   Medications:  Infusions:  . heparin      Assessment: Patient currently ordered Heparin 1350 units/hr infusion.  7/1 02:30 Heparin level resulted at 0.24  Goal of Therapy:  Heparin level 0.3-0.7 units/ml Monitor platelets by anticoagulation protocol: Yes   Plan:   Will bolus  with Heparin 1200 units IV then increase drip rate t 1500 unit/hr. Will check next Heparin level in 6hr at 11:00.  7/1 Heparin level at 1043= 0.37. Will check confirmatory level in 8 hrs at 1830.  7/1 PM: HL= 0.44. Will f/u AM labs.   7/2 AM: HL = 0.33 (therapeutic). Will recheck HL and CBC with AM labs tomorrow.  7/3 AM: HL = 0.21 (subtherapeutic) at an infusion rate of 1500 units/hr. Confirmed with RN that there have been no interruptions/issues with infusion. Will order bolus of 1250 units and increase infusion rate to 1650 units/hr. Will order heparin level in 6 hours.  Thank you for allowing pharmacy to be involved in this patients care.  Lenis Noon, PharmD Clinical Pharmacist 04/09/2016  5:45 AM

## 2016-04-11 LAB — CULTURE, BLOOD (ROUTINE X 2)
CULTURE: NO GROWTH
Culture: NO GROWTH

## 2016-04-18 ENCOUNTER — Encounter: Payer: Self-pay | Admitting: Internal Medicine

## 2016-04-18 ENCOUNTER — Inpatient Hospital Stay
Admission: AD | Admit: 2016-04-18 | Discharge: 2016-04-20 | DRG: 300 | Disposition: A | Discharge status: Home / Self Care | Payer: Medicare Other | Source: Ambulatory Visit | Attending: Internal Medicine | Admitting: Internal Medicine

## 2016-04-18 ENCOUNTER — Inpatient Hospital Stay: Payer: Medicare Other

## 2016-04-18 ENCOUNTER — Inpatient Hospital Stay: Payer: Medicare Other | Attending: Internal Medicine | Admitting: Internal Medicine

## 2016-04-18 ENCOUNTER — Ambulatory Visit
Admission: RE | Admit: 2016-04-18 | Discharge: 2016-04-18 | Disposition: A | Discharge status: Home / Self Care | Payer: Medicare Other | Source: Ambulatory Visit | Attending: Internal Medicine | Admitting: Internal Medicine

## 2016-04-18 VITALS — BP 135/77 | HR 85 | Temp 96.8°F | Resp 18 | Wt 181.7 lb

## 2016-04-18 DIAGNOSIS — J45909 Unspecified asthma, uncomplicated: Secondary | ICD-10-CM | POA: Insufficient documentation

## 2016-04-18 DIAGNOSIS — I82A11 Acute embolism and thrombosis of right axillary vein: Secondary | ICD-10-CM | POA: Insufficient documentation

## 2016-04-18 DIAGNOSIS — I10 Essential (primary) hypertension: Secondary | ICD-10-CM | POA: Insufficient documentation

## 2016-04-18 DIAGNOSIS — R51 Headache: Secondary | ICD-10-CM

## 2016-04-18 DIAGNOSIS — R519 Headache, unspecified: Secondary | ICD-10-CM

## 2016-04-18 DIAGNOSIS — I82C11 Acute embolism and thrombosis of right internal jugular vein: Secondary | ICD-10-CM | POA: Diagnosis present

## 2016-04-18 DIAGNOSIS — F17210 Nicotine dependence, cigarettes, uncomplicated: Secondary | ICD-10-CM

## 2016-04-18 DIAGNOSIS — Z923 Personal history of irradiation: Secondary | ICD-10-CM | POA: Insufficient documentation

## 2016-04-18 DIAGNOSIS — E119 Type 2 diabetes mellitus without complications: Secondary | ICD-10-CM | POA: Diagnosis present

## 2016-04-18 DIAGNOSIS — Z7984 Long term (current) use of oral hypoglycemic drugs: Secondary | ICD-10-CM

## 2016-04-18 DIAGNOSIS — J449 Chronic obstructive pulmonary disease, unspecified: Secondary | ICD-10-CM | POA: Diagnosis present

## 2016-04-18 DIAGNOSIS — M542 Cervicalgia: Secondary | ICD-10-CM | POA: Diagnosis present

## 2016-04-18 DIAGNOSIS — I82401 Acute embolism and thrombosis of unspecified deep veins of right lower extremity: Secondary | ICD-10-CM | POA: Diagnosis present

## 2016-04-18 DIAGNOSIS — R0602 Shortness of breath: Secondary | ICD-10-CM

## 2016-04-18 DIAGNOSIS — I252 Old myocardial infarction: Secondary | ICD-10-CM | POA: Insufficient documentation

## 2016-04-18 DIAGNOSIS — I2510 Atherosclerotic heart disease of native coronary artery without angina pectoris: Secondary | ICD-10-CM | POA: Insufficient documentation

## 2016-04-18 DIAGNOSIS — Z79899 Other long term (current) drug therapy: Secondary | ICD-10-CM | POA: Insufficient documentation

## 2016-04-18 DIAGNOSIS — C3490 Malignant neoplasm of unspecified part of unspecified bronchus or lung: Secondary | ICD-10-CM | POA: Diagnosis present

## 2016-04-18 DIAGNOSIS — R599 Enlarged lymph nodes, unspecified: Secondary | ICD-10-CM | POA: Insufficient documentation

## 2016-04-18 DIAGNOSIS — Z9221 Personal history of antineoplastic chemotherapy: Secondary | ICD-10-CM

## 2016-04-18 DIAGNOSIS — E871 Hypo-osmolality and hyponatremia: Secondary | ICD-10-CM | POA: Diagnosis present

## 2016-04-18 DIAGNOSIS — C3412 Malignant neoplasm of upper lobe, left bronchus or lung: Secondary | ICD-10-CM | POA: Diagnosis not present

## 2016-04-18 DIAGNOSIS — Z856 Personal history of leukemia: Secondary | ICD-10-CM

## 2016-04-18 DIAGNOSIS — Z800 Family history of malignant neoplasm of digestive organs: Secondary | ICD-10-CM | POA: Insufficient documentation

## 2016-04-18 DIAGNOSIS — Z7901 Long term (current) use of anticoagulants: Secondary | ICD-10-CM | POA: Insufficient documentation

## 2016-04-18 DIAGNOSIS — R079 Chest pain, unspecified: Secondary | ICD-10-CM | POA: Insufficient documentation

## 2016-04-18 DIAGNOSIS — C3492 Malignant neoplasm of unspecified part of left bronchus or lung: Secondary | ICD-10-CM

## 2016-04-18 DIAGNOSIS — Z598 Other problems related to housing and economic circumstances: Secondary | ICD-10-CM

## 2016-04-18 DIAGNOSIS — Z86718 Personal history of other venous thrombosis and embolism: Secondary | ICD-10-CM | POA: Insufficient documentation

## 2016-04-18 DIAGNOSIS — I82890 Acute embolism and thrombosis of other specified veins: Secondary | ICD-10-CM

## 2016-04-18 DIAGNOSIS — C9110 Chronic lymphocytic leukemia of B-cell type not having achieved remission: Secondary | ICD-10-CM

## 2016-04-18 DIAGNOSIS — Z599 Problem related to housing and economic circumstances, unspecified: Secondary | ICD-10-CM

## 2016-04-18 LAB — COMPREHENSIVE METABOLIC PANEL
ALBUMIN: 4 g/dL (ref 3.5–5.0)
ALT: 18 U/L (ref 17–63)
AST: 22 U/L (ref 15–41)
Alkaline Phosphatase: 53 U/L (ref 38–126)
Anion gap: 8 (ref 5–15)
BUN: 13 mg/dL (ref 6–20)
CHLORIDE: 96 mmol/L — AB (ref 101–111)
CO2: 30 mmol/L (ref 22–32)
CREATININE: 0.83 mg/dL (ref 0.61–1.24)
Calcium: 9.6 mg/dL (ref 8.9–10.3)
GFR calc Af Amer: >60 mL/min (ref >60)
GLUCOSE: 146 mg/dL — AB (ref 65–99)
POTASSIUM: 4.3 mmol/L (ref 3.5–5.1)
Sodium: 134 mmol/L — ABNORMAL LOW (ref 135–145)
Total Bilirubin: 0.6 mg/dL (ref 0.3–1.2)
Total Protein: 8.8 g/dL — ABNORMAL HIGH (ref 6.5–8.1)

## 2016-04-18 LAB — CBC WITH DIFFERENTIAL/PLATELET
BASOS ABS: 0.1 10*3/uL (ref 0–0.1)
BASOS PCT: 1 %
EOS PCT: 2 %
Eosinophils Absolute: 0.1 10*3/uL (ref 0–0.7)
HEMATOCRIT: 39.8 % — AB (ref 40.0–52.0)
Hemoglobin: 13.8 g/dL (ref 13.0–18.0)
LYMPHS PCT: 15 %
Lymphs Abs: 1.2 10*3/uL (ref 1.0–3.6)
MCH: 28.7 pg (ref 26.0–34.0)
MCHC: 34.7 g/dL (ref 32.0–36.0)
MCV: 82.6 fL (ref 80.0–100.0)
Monocytes Absolute: 0.7 10*3/uL (ref 0.2–1.0)
Monocytes Relative: 9 %
NEUTROS ABS: 6.1 10*3/uL (ref 1.4–6.5)
Neutrophils Relative %: 73 %
PLATELETS: 344 10*3/uL (ref 150–440)
RBC: 4.82 MIL/uL (ref 4.40–5.90)
RDW: 17.1 % — ABNORMAL HIGH (ref 11.5–14.5)
WBC: 8.3 10*3/uL (ref 3.8–10.6)

## 2016-04-18 LAB — APTT: aPTT: 39 seconds — ABNORMAL HIGH (ref 24–36)

## 2016-04-18 LAB — GLUCOSE, CAPILLARY
GLUCOSE-CAPILLARY: 151 mg/dL — AB (ref 65–99)
GLUCOSE-CAPILLARY: 151 mg/dL — AB (ref 65–99)

## 2016-04-18 LAB — PROTIME-INR
INR: 1.42
Prothrombin Time: 17.4 seconds — ABNORMAL HIGH (ref 11.4–15.0)

## 2016-04-18 LAB — HEPARIN LEVEL (UNFRACTIONATED): Heparin Unfractionated: >3.6 IU/mL — ABNORMAL HIGH (ref 0.30–0.70)

## 2016-04-18 MED ORDER — IOPAMIDOL (ISOVUE-370) INJECTION 76%
80.0000 mL | Freq: Once | INTRAVENOUS | Status: AC | PRN
  Administered 2016-04-18: 80 mL via INTRAVENOUS

## 2016-04-18 MED ORDER — NICOTINE 14 MG/24HR TD PT24
14.0000 mg | MEDICATED_PATCH | Freq: Every day | TRANSDERMAL | Status: DC
  Administered 2016-04-18 – 2016-04-20 (×3): 14 mg via TRANSDERMAL
  Filled 2016-04-18 (×3): qty 1

## 2016-04-18 MED ORDER — ONDANSETRON HCL 4 MG PO TABS: 4.0000 mg | ORAL_TABLET | Freq: Four times a day (QID) | ORAL | Status: DC | PRN

## 2016-04-18 MED ORDER — ALBUTEROL SULFATE (2.5 MG/3ML) 0.083% IN NEBU: 2.5000 mg | INHALATION_SOLUTION | RESPIRATORY_TRACT | Status: DC | PRN

## 2016-04-18 MED ORDER — TRAMADOL HCL 50 MG PO TABS: 50.0000 mg | ORAL_TABLET | Freq: Four times a day (QID) | ORAL | Status: DC | PRN

## 2016-04-18 MED ORDER — HYDROCODONE-ACETAMINOPHEN 5-325 MG PO TABS: 1.0000 | ORAL_TABLET | ORAL | Status: DC | PRN

## 2016-04-18 MED ORDER — ALPRAZOLAM 0.5 MG PO TABS: 0.5000 mg | ORAL_TABLET | Freq: Three times a day (TID) | ORAL | Status: DC | PRN

## 2016-04-18 MED ORDER — INSULIN ASPART 100 UNIT/ML ~~LOC~~ SOLN
0.0000 [IU] | Freq: Every day | SUBCUTANEOUS | Status: DC
  Administered 2016-04-19: 21:00:00 2 [IU] via SUBCUTANEOUS
  Filled 2016-04-18: qty 2

## 2016-04-18 MED ORDER — HEPARIN (PORCINE) IN NACL 100-0.45 UNIT/ML-% IJ SOLN
16.0000 [IU]/kg/h | INTRAMUSCULAR | Status: DC
  Administered 2016-04-19 – 2016-04-20 (×2): 16 [IU]/kg/h via INTRAVENOUS
  Filled 2016-04-18 (×4): qty 250

## 2016-04-18 MED ORDER — MAGIC MOUTHWASH
5.0000 mL | Freq: Four times a day (QID) | ORAL | Status: DC | PRN
  Filled 2016-04-18: qty 5

## 2016-04-18 MED ORDER — MORPHINE SULFATE (PF) 4 MG/ML IV SOLN: 4.0000 mg | INTRAVENOUS | Status: DC | PRN

## 2016-04-18 MED ORDER — ONDANSETRON HCL 4 MG/2ML IJ SOLN: 4.0000 mg | Freq: Four times a day (QID) | INTRAMUSCULAR | Status: DC | PRN

## 2016-04-18 MED ORDER — SODIUM CHLORIDE 0.9 % IV SOLN
INTRAVENOUS | Status: DC
  Administered 2016-04-18 – 2016-04-20 (×4): via INTRAVENOUS

## 2016-04-18 MED ORDER — ALBUTEROL SULFATE HFA 108 (90 BASE) MCG/ACT IN AERS: 2.0000 | INHALATION_SPRAY | Freq: Four times a day (QID) | RESPIRATORY_TRACT | Status: DC | PRN

## 2016-04-18 MED ORDER — PROCHLORPERAZINE MALEATE 10 MG PO TABS: 10.0000 mg | ORAL_TABLET | Freq: Four times a day (QID) | ORAL | Status: DC | PRN

## 2016-04-18 MED ORDER — ALBUTEROL SULFATE (2.5 MG/3ML) 0.083% IN NEBU: 3.0000 mL | INHALATION_SOLUTION | Freq: Four times a day (QID) | RESPIRATORY_TRACT | Status: DC | PRN

## 2016-04-18 MED ORDER — ACETAMINOPHEN 325 MG PO TABS
650.0000 mg | ORAL_TABLET | Freq: Four times a day (QID) | ORAL | Status: DC | PRN
  Administered 2016-04-20: 03:00:00 650 mg via ORAL
  Filled 2016-04-18: qty 2

## 2016-04-18 MED ORDER — INSULIN ASPART 100 UNIT/ML ~~LOC~~ SOLN
0.0000 [IU] | Freq: Three times a day (TID) | SUBCUTANEOUS | Status: DC
  Administered 2016-04-19 (×2): 1 [IU] via SUBCUTANEOUS
  Administered 2016-04-20: 2 [IU] via SUBCUTANEOUS
  Filled 2016-04-18: qty 1
  Filled 2016-04-18: qty 2
  Filled 2016-04-18: qty 1

## 2016-04-18 MED ORDER — ACETAMINOPHEN 650 MG RE SUPP: 650.0000 mg | Freq: Four times a day (QID) | RECTAL | Status: DC | PRN

## 2016-04-18 NOTE — Progress Notes (Signed)
ANTICOAGULATION CONSULT NOTE - Initial Consult  Pharmacy Consult for Heparin  Indication: DVT ( Right IJ)  Allergies  Allergen Reactions  . Clarithromycin Nausea And Vomiting  . Penicillins Nausea And Vomiting, Rash and Other (See Comments)    Has patient had a PCN reaction causing immediate rash, facial/tongue/throat swelling, SOB or lightheadedness with hypotension: No Has patient had a PCN reaction causing severe rash involving mucus membranes or skin necrosis: No Has patient had a PCN reaction that required hospitalization No Has patient had a PCN reaction occurring within the last 10 years: No If all of the above answers are "NO", then may proceed with Cephalosporin use.    Patient Measurements: Height: '5\' 9"'$  (175.3 cm) Weight: 183 lb 12.8 oz (83.371 kg) IBW/kg (Calculated) : 70.7   Vital Signs: Temp: 97.5 F (36.4 C) (07/12 1739) Temp Source: Oral (07/12 1739) BP: 142/56 mmHg (07/12 1739) Pulse Rate: 80 (07/12 1739)  Labs:  Recent Labs  04/18/16 0835 04/18/16 1741  HGB 13.8  --   HCT 39.8*  --   PLT 344  --   APTT  --  39*  LABPROT  --  17.4*  INR  --  1.42  HEPARINUNFRC  --  >3.60*  CREATININE 0.83  --     Estimated Creatinine Clearance: 78.1 mL/min (by C-G formula based on Cr of 0.83).   Medical History: Past Medical History  Diagnosis Date  . Myocardial infarct ([place])   . COPD (chronic obstructive pulmonary disease) ([place])   . Hypertension   . Diabetes mellitus without complication ([place])   . Asthma   . Lung cancer, upper lobe ([place]) 2009  . CML (chronic myelocytic leukemia) ([place])   . Diabetes mellitus type II, controlled ([place])   . Small cell lung cancer ([place])   . 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 Pharmacy consulted for heparin dosing and monitoring. Patient was taking Xarelto at home. Last reported dose this morning around 7 am.   Goal of Therapy:  Heparin level 0.3-0.7 units/ml aPTT  66-102s seconds Monitor platelets by anticoagulation protocol: Yes   Plan:  Baseline aPTT and HL ordered. Will start heparin drip approx 24 hours after last Xarelto dose. No bolus, start heparin 1350 units/hr (16units/kg/hr) at 0700 on 7/13. Heparin level and aPTT ordered for 6 hours after initiation.   Nancy Fetter, PharmD Clinical Pharmacist 04/18/2016 7:36 PM

## 2016-04-18 NOTE — Progress Notes (Signed)
[facility] OFFICE PROGRESS NOTE  Patient Care Team: Madelyn Brunner, MD as PCP - General (Internal Medicine)   SUMMARY OF ONCOLOGIC HISTORY:  Oncology History   # April-MAY 2017 SMALL CELL LUNG CANCER EXTENSIVE STAGE  LUL #1 4.1x 3.5cm;#2- 3.3x3.0 #3 Left pleural based nodule 42m [new]; Left apical radiation changes. RUL nodule- 12x10 mm-stable/benign; LEft supraclav LN/ right hilarLN/subcarinal LN/ Left axillary LN; MAY 17th 2017- START Carbo-Etop q3 W; June 23rd CT- Significant PR  # Left Chest pain- Pal RT [may 17th]  # [facility]- NEG/ Smoking  # SEP 2009- SQUAMOUS CELL CA LUL T2/T3 [Stage IIB] s/p Chemo-RT [decined Surgery]     Small cell lung cancer ([facility]   02/06/2016 Initial Diagnosis Small cell lung cancer ([facility]    Primary malignant neoplasm of left upper lobe of lung ([facility]   04/18/2016 Initial Diagnosis Primary malignant neoplasm of left upper lobe of lung ([facility])     INTERVAL HISTORY:  75year old male patient with extensive stage small cell lung cancer currently on first line therapy with carboplatin etoposide chemotherapy- status post cycle #2 approximately 5 weeks ago.  He was admitted to the hospital for right IJ DVT extensive on IV heparin; symptomatic improvement. Discharged home on xarelto- secondary to insurance issues-no Lovenox.  Patient complains of worsening headaches; neck pain is improved. He is currently taking Xarelto. No nausea no vomiting. Poor appetite. Patient not taking any pain medication for this left chest wall pain/resolved.  Patient has chronic mild shortness of breath or chronic cough. No hoarseness of voice.   REVIEW OF SYSTEMS:  A complete 10 point review of system is done which is negative except mentioned above/history of present illness.   PAST MEDICAL HISTORY :  Past Medical History  Diagnosis Date  . Myocardial infarct ([facility]   . COPD (chronic obstructive pulmonary disease) ([facility]   . Hypertension   . Diabetes mellitus  without complication ([facility]   . Asthma   . Lung cancer, upper lobe ([facility] 2009  . CML (chronic myelocytic leukemia) ([facility]   . Diabetes mellitus type II, controlled ([facility]   . Small cell lung cancer ([facility]   . 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 Throat surgery    . Tumor removal Right     "years ago-right lower posterior side"  . Peripheral vascular catheterization N/A 02/21/2016    Procedure: PGlori LuisCath Insertion;  Surgeon: JAlgernon Huxley MD;  [facility];  Service: Cardiovascular;  Laterality: N/A;  . Port-a-cath removal N/A 04/07/2016    Procedure: REMOVAL PORT-A-CATH;  Surgeon: VSerafina Mitchell MD;  Location: [facility];  Service: Vascular;  Laterality: N/A;    FAMILY HISTORY :   Family History  Problem Relation Age of Onset  . Cancer - Colon Father   . Cancer - Colon Sister     SOCIAL HISTORY:   Social History  Substance Use Topics  . Smoking status: Current Every Day Smoker -- 1.00 packs/day for 62 years    Types: Cigarettes  . Smokeless tobacco: Not on file  . Alcohol Use: 3.6 oz/week    6 Cans of beer per week     Comment: rarely    ALLERGIES:  is allergic to clarithromycin and penicillins.  MEDICATIONS:  Current Outpatient Prescriptions  Medication Sig Dispense Refill  . albuterol (PROAIR HFA) 108 (90 Base) MCG/ACT inhaler Inhale 2 puffs into  the lungs every 6 (six) hours as needed for wheezing or shortness of breath.     Marland Kitchen albuterol (PROVENTIL) (2.5 MG/3ML) 0.083% nebulizer solution Inhale 3 mLs into the lungs every 6 (six) hours as needed for wheezing or shortness of breath.     . ALPRAZolam (XANAX) 0.5 MG tablet Take by mouth.    . enoxaparin (LOVENOX) 150 MG/ML injection Inject 0.55 mLs (85 mg total) into the skin every 12 (twelve) hours. 30 Syringe 0  . lidocaine-prilocaine (EMLA) cream Apply 1 application topically as needed (prior to accessing port).    . magic  mouthwash SOLN Take 5 mLs by mouth 4 (four) times daily as needed for mouth pain. 200 mL 3  . metFORMIN (GLUCOPHAGE) 1000 MG tablet Take 1,000 mg by mouth 2 (two) times daily with a meal.     . ondansetron (ZOFRAN) 8 MG tablet Take 8 mg by mouth every 8 (eight) hours as needed for nausea or vomiting.    . prochlorperazine (COMPAZINE) 10 MG tablet Take 1 tablet (10 mg total) by mouth every 6 (six) hours as needed for nausea or vomiting. 30 tablet 0  . Rivaroxaban 15 & 20 MG TBPK Take as directed on package: Start with one '15mg'$  tablet by mouth twice a day with food. On Day 22, switch to one '20mg'$  tablet once a day with food. 51 each 0  . traMADol (ULTRAM) 50 MG tablet Take 50 mg by mouth every 6 (six) hours as needed for moderate pain.   3   No current facility-administered medications for this visit.    PHYSICAL EXAMINATION: ECOG PERFORMANCE STATUS: 1 - Symptomatic but completely ambulatory  BP 135/77 mmHg  Pulse 85  Temp(Src) 96.8 F (36 C) (Tympanic)  Resp 18  Wt 181 lb 10.5 oz (82.4 kg)  Filed Weights   04/18/16 0855  Weight: 181 lb 10.5 oz (82.4 kg)    GENERAL: Well-nourished well-developed; Alert, no distress and comfortable.   Accompanied by wife /1daughter  EYES: no pallor or icterus OROPHARYNX: no thrush or ulceration; poor dentition; Bil ear wax  NECK: vague swelling under the neck; tender; no skin changes.  LYMPH:  no palpable lymphadenopathy in the cervical, axillary or inguinal regions LUNGS: Decreased air entry bilaterally.  No wheeze or crackles HEART/CVS: regular rate & rhythm and no murmurs; No lower extremity edema ABDOMEN:abdomen soft, non-tender and normal bowel sounds Musculoskeletal:no cyanosis of digits and no clubbing; NO tenderness noted in the left chest wall. No lumps or bumps noted. PSYCH: alert & oriented x 3 with fluent speech NEURO: no focal motor/sensory deficits SKIN:  no rashes or significant lesions  LABORATORY DATA:  I have reviewed the data as  listed    Component Value Date/Time   NA 134* 04/18/2016 0835   K 4.3 04/18/2016 0835   CL 96* 04/18/2016 0835   CO2 30 04/18/2016 0835   GLUCOSE 146* 04/18/2016 0835   BUN 13 04/18/2016 0835   CREATININE 0.83 04/18/2016 0835   CREATININE 0.99 02/26/2014 1033   CALCIUM 9.6 04/18/2016 0835   CALCIUM 8.2* 02/26/2014 1033   PROT 8.8* 04/18/2016 0835   PROT 7.3 02/26/2014 1033   ALBUMIN 4.0 04/18/2016 0835   ALBUMIN 3.4 02/26/2014 1033   AST 22 04/18/2016 0835   AST 17 02/26/2014 1033   ALT 18 04/18/2016 0835   ALT 25 02/26/2014 1033   ALKPHOS 53 04/18/2016 0835   ALKPHOS 54 02/26/2014 1033   BILITOT 0.6 04/18/2016 0835   BILITOT 0.4 02/26/2014  [place] >60 04/18/2016 0835   GFRNONAA >60 02/26/2014 1033   GFRAA >60 04/18/2016 0835   GFRAA >60 02/26/2014 1033    No results found for: SPEP, UPEP  Lab Results  Component Value Date   WBC 8.3 04/18/2016   NEUTROABS 6.1 04/18/2016   HGB 13.8 04/18/2016   HCT 39.8* 04/18/2016   MCV 82.6 04/18/2016   PLT 344 04/18/2016      Chemistry      Component Value Date/Time   NA 134* 04/18/2016 0835   K 4.3 04/18/2016 0835   CL 96* 04/18/2016 0835   CO2 30 04/18/2016 0835   BUN 13 04/18/2016 0835   CREATININE 0.83 04/18/2016 0835   CREATININE 0.99 02/26/2014 1033      Component Value Date/Time   CALCIUM 9.6 04/18/2016 0835   CALCIUM 8.2* 02/26/2014 1033   ALKPHOS 53 04/18/2016 0835   ALKPHOS 54 02/26/2014 1033   AST 22 04/18/2016 0835   AST 17 02/26/2014 1033   ALT 18 04/18/2016 0835   ALT 25 02/26/2014 1033   BILITOT 0.6 04/18/2016 0835   BILITOT 0.4 02/26/2014 1033      ASSESSMENT & PLAN:   Primary malignant neoplasm of left upper lobe of lung ([place]) # Small cell lung cancer- Extensive stage small cell lung cancer-Status post cycle #2 of carbo etoposide approximately 5 weeks ago. Also on palliative radiation left chest wall.;  CT scan shows significant improvement of the left upper lobe lung mass;  Shows  stable previous radiation changes.  # Increasing neck pain.head aches- stat CT brain/neck with contrast- continued presence of the significant IJ DVT. Patient also has atherosclerotic disease in the carotid arteries bilaterally. Given the continued pain-  Recommend IV heparin admission the hospital.  # Patient is aware of the plan. Left a message for vascular surgery. Also please the hospital is for admission.   # 40 minutes face-to-face with the patient discussing the above plan of care; more than 50% of time spent on prognosis/ natural history; counseling and coordination.    Cammie Sickle, MD 04/18/2016 1:52 PM

## 2016-04-18 NOTE — Assessment & Plan Note (Addendum)
#   Small cell lung cancer- Extensive stage small cell lung cancer-Status post cycle #2 of carbo etoposide approximately 5 weeks ago. Also on palliative radiation left chest wall.;  CT scan shows significant improvement of the left upper lobe lung mass;  Shows stable previous radiation changes.  # Increasing neck pain.head aches- stat CT brain/neck with contrast- continued presence of the significant IJ DVT. Patient also has atherosclerotic disease in the carotid arteries bilaterally. Given the continued pain-  Recommend IV heparin admission the hospital.  # Patient is aware of the plan. Left a message for vascular surgery. Also please the hospital is for admission.

## 2016-04-18 NOTE — H&P (Addendum)
[facility] [place] NAME: Richard Jimenez    MR#:  [id number]  DATE OF BIRTH:  14-Aug-1941  DATE OF ADMISSION:  04/18/2016  PRIMARY CARE PHYSICIAN: Madelyn Brunner, MD   REQUESTING/REFERRING PHYSICIAN: Cammie Sickle, MD  CHIEF COMPLAINT:  No chief complaint on file.  Right IJ thrombosis HISTORY OF PRESENT ILLNESS:  Richard Jimenez  is a 75 y.o. male with a known history of Lung cancer, COPD, hypertension and diabetes. He has extensive stage SMALL cell lung cancer, he is status post chemotherapy. He was recently admitted to the hospital for right IJ DVT. He was treated with heparin drip then changed to xarelto. The patient complains of a worsening headache, neck pain and head swelling. But he denies any shortness of breath, cough or weight loss. Since the CT showed IJ thrombosis is still present, oncologist, Dr. Rogue Bussing suggested admitting patient, start heparin drip for thrombectomy.  PAST MEDICAL HISTORY:   Past Medical History  Diagnosis Date  . Myocardial infarct ([place])   . COPD (chronic obstructive pulmonary disease) ([place])   . Hypertension   . Diabetes mellitus without complication ([place])   . Asthma   . Lung cancer, upper lobe ([place]) 2009  . CML (chronic myelocytic leukemia) ([place])   . Diabetes mellitus type II, controlled ([place])   . Small cell lung cancer ([place])   . 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 Throat surgery    . Tumor removal Right     "years ago-right lower posterior side"  . Peripheral vascular catheterization N/A 02/21/2016    Procedure: Glori Luis Cath Insertion;  Surgeon: Algernon Huxley, MD;  Location: [facility];  Service: Cardiovascular;  Laterality: N/A;  . Port-a-cath removal N/A 04/07/2016    Procedure: REMOVAL PORT-A-CATH;  Surgeon: Serafina Mitchell, MD;  Location: [facility];  Service: Vascular;  Laterality: N/A;     SOCIAL HISTORY:   Social History  Substance Use Topics  . Smoking status: Current Every Day Smoker -- 1.00 packs/day for 62 years    Types: Cigarettes  . Smokeless tobacco: Not on file  . Alcohol Use: 3.6 oz/week    6 Cans of beer per week     Comment: rarely    FAMILY HISTORY:   Family History  Problem Relation Age of Onset  . Cancer - Colon Father   . Cancer - Colon Sister     DRUG ALLERGIES:   Allergies  Allergen Reactions  . Clarithromycin Nausea And Vomiting  . Penicillins Nausea And Vomiting, Rash and Other (See Comments)    Has patient had a PCN reaction causing immediate rash, facial/tongue/throat swelling, SOB or lightheadedness with hypotension: No Has patient had a PCN reaction causing severe rash involving mucus membranes or skin necrosis: No Has patient had a PCN reaction that required hospitalization No Has patient had a PCN reaction occurring within the last 10 years: No If all of the above answers are "NO", then may proceed with Cephalosporin use.    REVIEW OF SYSTEMS:  CONSTITUTIONAL: No fever, fatigue or weakness. Neck pain and headache. EYES: No blurred or double vision.  EARS, NOSE, AND THROAT: No tinnitus or ear pain.  RESPIRATORY: No cough, shortness of breath, wheezing or hemoptysis.  CARDIOVASCULAR: No chest pain, orthopnea, edema.  GASTROINTESTINAL: No nausea, vomiting, diarrhea or abdominal pain.  GENITOURINARY: No dysuria, hematuria.  ENDOCRINE: No  polyuria, nocturia,  HEMATOLOGY: No anemia, easy bruising or bleeding SKIN: No rash or lesion. MUSCULOSKELETAL: No joint pain or arthritis.   NEUROLOGIC: No tingling, numbness, weakness.  PSYCHIATRY: No anxiety or depression.   MEDICATIONS AT HOME:   Prior to Admission medications   Medication Sig Start Date End Date Taking? Authorizing Provider  albuterol (PROAIR HFA) 108 (90 Base) MCG/ACT inhaler Inhale 2 puffs into the lungs every 6 (six) hours as needed for wheezing or shortness of  breath.     Historical Provider, MD  albuterol (PROVENTIL) (2.5 MG/3ML) 0.083% nebulizer solution Inhale 3 mLs into the lungs every 6 (six) hours as needed for wheezing or shortness of breath.     Historical Provider, MD  ALPRAZolam Duanne Moron) 0.5 MG tablet Take by mouth. 04/17/16 06/16/16  Historical Provider, MD  enoxaparin (LOVENOX) 150 MG/ML injection Inject 0.55 mLs (85 mg total) into the skin every 12 (twelve) hours. 04/09/16   Epifanio Lesches, MD  lidocaine-prilocaine (EMLA) cream Apply 1 application topically as needed (prior to accessing port).    Historical Provider, MD  magic mouthwash SOLN Take 5 mLs by mouth 4 (four) times daily as needed for mouth pain. 04/04/16   Cammie Sickle, MD  metFORMIN (GLUCOPHAGE) 1000 MG tablet Take 1,000 mg by mouth 2 (two) times daily with a meal.     Historical Provider, MD  ondansetron (ZOFRAN) 8 MG tablet Take 8 mg by mouth every 8 (eight) hours as needed for nausea or vomiting.    Historical Provider, MD  prochlorperazine (COMPAZINE) 10 MG tablet Take 1 tablet (10 mg total) by mouth every 6 (six) hours as needed for nausea or vomiting. 02/07/16   Cammie Sickle, MD  Rivaroxaban 15 & 20 MG TBPK Take as directed on package: Start with one '15mg'$  tablet by mouth twice a day with food. On Day 22, switch to one '20mg'$  tablet once a day with food. 04/09/16   Cammie Sickle, MD  traMADol (ULTRAM) 50 MG tablet Take 50 mg by mouth every 6 (six) hours as needed for moderate pain.     Historical Provider, MD      VITAL SIGNS:  Blood pressure 142/56, pulse 80, temperature 97.5 F (36.4 C), temperature source Oral, height '5\' 9"'$  (1.753 m), weight 183 lb 12.8 oz (83.371 kg), SpO2 100 %.  PHYSICAL EXAMINATION:  GENERAL:  75 y.o.-year-old patient lying in the bed with no acute distress.  EYES: Pupils equal, round, reactive to light and accommodation. No scleral icterus. Extraocular muscles intact.  HEENT: Head atraumatic, normocephalic. Oropharynx and  nasopharynx clear.  NECK:  Supple, no jugular venous distention. No thyroid enlargement, no tenderness.  LUNGS: Very diminished sounds bilaterally, no wheezing, rales,rhonchi or crepitation. No use of accessory muscles of respiration.  CARDIOVASCULAR: S1, S2 normal. No murmurs, rubs, or gallops.  ABDOMEN: Soft, nontender, nondistended. Bowel sounds present. No organomegaly or mass.  EXTREMITIES: No pedal edema, cyanosis, or clubbing.  NEUROLOGIC: Cranial nerves II through XII are intact. Muscle strength 5/5 in all extremities. Sensation intact. Gait not checked.  PSYCHIATRIC: The patient is alert and oriented x 3.  SKIN: No obvious rash, lesion, or ulcer.   LABORATORY PANEL:   CBC  Recent Labs Lab 04/18/16 0835  WBC 8.3  HGB 13.8  HCT 39.8*  PLT 344   ------------------------------------------------------------------------------------------------------------------  Chemistries   Recent Labs Lab 04/18/16 0835  NA 134*  K 4.3  CL 96*  CO2 30  GLUCOSE 146*  BUN 13  CREATININE 0.83  CALCIUM 9.6  AST 22  ALT 18  ALKPHOS 53  BILITOT 0.6   ------------------------------------------------------------------------------------------------------------------  Cardiac Enzymes No results for input(s): TROPONINI in the last 168 hours. 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 Increasing neck pain and headaches. Initial encounter. EXAM: CT HEAD WITHOUT AND WITH CONTRAST TECHNIQUE: Contiguous axial images were obtained from the base of the skull through the vertex without and with intravenous contrast CONTRAST:  80 mL Isovue 370 COMPARISON:  Neck CT 04/06/2016.  Brain MRI 01/17/2016. FINDINGS: Visualized paranasal sinuses and mastoids are stable and well pneumatized.  No suspicious osseous lesion identified. Negative visible orbit and scalp soft tissues. Calcified atherosclerosis at the skull base. No midline shift, mass effect, or evidence of intracranial mass lesion. No acute intracranial hemorrhage identified. 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 No cortically based acute infarct identified. No abnormal enhancement identified. Major intracranial vascular structures are enhancing. At the skullbase there is continued thrombosis of the right internal jugular bulb and visualized superior right IJ (series 7, image 1). The right sigmoid sinus appears to remain patent. IMPRESSION: 1.  No metastatic disease or acute intracranial abnormality. 2. Continued right IJ bulb thrombosis. The right sigmoid sinus appears to remain patent, see also neck CTA findings today reported separately. 3. 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 Electronically Signed   By: Genevie Ann M.D.   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 Increasing neck pain. Right jugular vein thrombosis diagnosed in June. Subsequent encounter. EXAM: CT ANGIOGRAPHY NECK TECHNIQUE: Multidetector CT imaging of the neck was performed using the standard protocol during bolus administration of intravenous contrast. Multiplanar CT image reconstructions and MIPs were obtained to evaluate the vascular anatomy. Carotid stenosis measurements (when applicable) are obtained utilizing NASCET criteria, using the distal internal carotid diameter as the denominator. CONTRAST:  80 mL Isovue 370 in conjunction with contrast enhanced imaging of the head reported separately. COMPARISON:  CT head from today reported separately. Neck CT 04/06/2016 FINDINGS: Skeleton: Absent dentition. No osseous metastatic disease identified in the neck or at the skullbase. Abnormal bone  mineralization of the left first rib is associated with the abnormal left lung apex, and stable. Visualized paranasal sinuses and mastoids are stable and well pneumatized. Other neck: Abnormal opacified left lung apex with mild cavitary changes and superimposed air bronchograms. Stable right upper lobe pulmonary nodule measuring 15 mm on series 4, image 229 today. Superimposed paraseptal emphysema. No superior mediastinal lymphadenopathy. Negative thyroid, pharynx, parapharyngeal spaces and retropharyngeal space. Evidence of left focal cord paralysis, otherwise negative visualized larynx. Sublingual spaces, submandibular glands, and parotid glands are within normal limits. No cervical lymphadenopathy. Continued right internal jugular vein thrombosis from the right IJ bulb to the junction with the right subclavian vein. The patient left upper extremity was injected, but the right innominate vein appears to remain patent. The SVC is patent. There are venous collaterals about the left chest wall, which appear related to left subclavian vein stenosis. However, the left innominate vein is normally patent. The left internal jugular vein is not opacified with contrast. The right sigmoid sinus remains patent. Aortic arch: 3 vessel arch configuration with extensive soft and calcified arch atherosclerosis. Right carotid system: No brachiocephalic artery or right  CCA origin stenosis. Soft and calcified plaque in the mid right CCA with less than 50 % stenosis with respect to the distal vessel. Soft and calcified plaque at the right carotid bifurcation affecting the right ICA origin and resulting an stenosis up to 60-65 % with respect to the distal vessel. Otherwise negative cervical right ICA. Visible right ICA siphon is patent with mild calcified plaque. Left carotid system: Calcified plaque at the left CCA origin with high-grade stenosis. See series 8, image 107. Despite this the left CCA remains patent, although the proximal  left CCA is somewhat poorly enhancing. Enhancement appears more symmetric at the level of the left carotid bifurcation where there is calcified and soft plaque affecting left ICA origin and bulb, but resulting an less than 50 % stenosis with respect to the distal vessel. The cervical left ICA is non dominant compared to the right, but remains patent. The visible left ICA siphon is patent with calcified plaque. 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 Despite this, the right vertebral artery origin is normal. The right vertebral artery is patent to the skullbase without stenosis. The right PICA is patent. The vertebrobasilar junction is patent. The visible basilar artery is patent. High-grade stenosis in the proximal left subclavian artery about 1.5 cm beyond its origin, estimated at 80-90 percent (series 8, image 129). The left subclavian remains patent. Paravertebral venous Limits detail of the left vertebral artery origin and left V2 segment. The proximal left vertebral artery could be stenotic or segmentally occluded. The distal left vertebral artery is patent. There is left V3 calcified plaque. There is mild left V4 calcified plaque. The left PICA is patent. The distal left vertebral artery is diminutive beyond the PICA. IMPRESSION: 1. 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 However, the right sigmoid sinus and right innominate vein remain patent. 2. Severe atherosclerosis in the proximal great vessels and in the neck. High-grade stenosis at the left CCA origin and affecting both proximal subclavian arteries. 3. Hemodynamically significant atherosclerotic stenosis at the right ICA origin estimated at 65%. 4. Dominant right vertebral artery without stenosis, the left is not well evaluated due to venous contamination. 5. 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 6. Stable visualized upper chest. No metastatic disease identified in the neck. Electronically Signed   By: Genevie Ann M.D.   On: 04/18/2016 12:22    EKG:   Orders placed or performed during the hospital encounter of 12/28/15  . EKG 12-Lead  . EKG 12-Lead  . ED EKG within 10 minutes  . ED EKG within 10 minutes  . EKG    IMPRESSION AND PLAN:   Right IJ DVT. Hold xarelto, start heparin drip and follow-up vascular surgery consult. Nothing by mouth after midnight.  Hyponatremia. Mild, follow-up BMP.  Lung cancer. Follow-up with oncologist.  COPD. Stable, nebulizer when necessary. Tobacco abuse. Smoking cessation was counseled for 3-4 minutes, nicotine patch. Diabetes. Start sliding scale.   All the records are reviewed and case discussed with ED provider. Management plans discussed with the patient, His wife and granddaughter and they are in agreement.  CODE STATUS: Limited code without intubation.  TOTAL TIME TAKING CARE OF THIS PATIENT: 55 minutes.    Demetrios Loll M.D on 04/18/2016 at 5:33 PM  Between 7am to 6pm - Pager - [phone]  After 6pm go to [email or web address] - [place]  [facility]  Office  [phone]  CC: Primary care physician; Madelyn Brunner, MD

## 2016-04-18 NOTE — Progress Notes (Signed)
Patient discharged from [facility] one week ago on Monday.  States he has a blood clot in his left neck.  States he has a headache 9/10.  Does not feel good today. Nauseated.

## 2016-04-18 NOTE — Progress Notes (Signed)
Pt admitted to room 101. Report called to Rn on 1C by Nira Conn, Jennette x [phone].  Pt will need to be NPO after midnight per Dr. Rogue Bussing.

## 2016-04-19 ENCOUNTER — Encounter
Admission: AD | Disposition: A | Discharge status: Home / Self Care | Payer: Self-pay | Source: Ambulatory Visit | Attending: Internal Medicine

## 2016-04-19 ENCOUNTER — Inpatient Hospital Stay: Payer: Medicare Other

## 2016-04-19 HISTORY — PX: PERIPHERAL VASCULAR CATHETERIZATION: SHX172C

## 2016-04-19 LAB — APTT
aPTT: 72 seconds — ABNORMAL HIGH (ref 24–36)
aPTT: 85 seconds — ABNORMAL HIGH (ref 24–36)

## 2016-04-19 LAB — GLUCOSE, CAPILLARY: Glucose-Capillary: 218 mg/dL — ABNORMAL HIGH (ref 65–99)

## 2016-04-19 LAB — HEPARIN LEVEL (UNFRACTIONATED): HEPARIN UNFRACTIONATED: 0.76 [IU]/mL — AB (ref 0.30–0.70)

## 2016-04-19 SURGERY — THROMBECTOMY
Anesthesia: Moderate Sedation | Laterality: Right

## 2016-04-19 MED ORDER — FENTANYL CITRATE (PF) 100 MCG/2ML IJ SOLN
INTRAMUSCULAR | Status: DC | PRN
  Administered 2016-04-19: 50 ug via INTRAVENOUS
  Administered 2016-04-19 (×2): 25 ug via INTRAVENOUS

## 2016-04-19 MED ORDER — FENTANYL CITRATE (PF) 100 MCG/2ML IJ SOLN
INTRAMUSCULAR | Status: AC
  Filled 2016-04-19: qty 2

## 2016-04-19 MED ORDER — HEPARIN (PORCINE) IN NACL 2-0.9 UNIT/ML-% IJ SOLN
INTRAMUSCULAR | Status: AC
  Filled 2016-04-19: qty 1000

## 2016-04-19 MED ORDER — IPRATROPIUM-ALBUTEROL 0.5-2.5 (3) MG/3ML IN SOLN
3.0000 mL | RESPIRATORY_TRACT | Status: DC
  Administered 2016-04-19 – 2016-04-20 (×3): 3 mL via RESPIRATORY_TRACT
  Filled 2016-04-19 (×3): qty 3

## 2016-04-19 MED ORDER — MIDAZOLAM HCL 2 MG/2ML IJ SOLN
INTRAMUSCULAR | Status: DC | PRN
  Administered 2016-04-19: 2 mg via INTRAVENOUS
  Administered 2016-04-19 (×3): 1 mg via INTRAVENOUS

## 2016-04-19 MED ORDER — HEPARIN SODIUM (PORCINE) 1000 UNIT/ML IJ SOLN
INTRAMUSCULAR | Status: DC | PRN
  Administered 2016-04-19: 3000 [IU] via INTRAVENOUS

## 2016-04-19 MED ORDER — HEPARIN SODIUM (PORCINE) 1000 UNIT/ML IJ SOLN
INTRAMUSCULAR | Status: AC
  Filled 2016-04-19: qty 1

## 2016-04-19 MED ORDER — MIDAZOLAM HCL 5 MG/5ML IJ SOLN
INTRAMUSCULAR | Status: AC
  Filled 2016-04-19: qty 5

## 2016-04-19 MED ORDER — CLINDAMYCIN PHOSPHATE 300 MG/50ML IV SOLN
INTRAVENOUS | Status: AC
  Filled 2016-04-19: qty 50

## 2016-04-19 MED ORDER — ALTEPLASE 2 MG IJ SOLR
INTRAMUSCULAR | Status: DC | PRN
  Administered 2016-04-19: 4 mg

## 2016-04-19 MED ORDER — DEXTROSE 5 % IV SOLN
INTRAVENOUS | Status: AC
  Filled 2016-04-19 (×9): qty 1.5

## 2016-04-19 MED ORDER — IOPAMIDOL (ISOVUE-300) INJECTION 61%
INTRAVENOUS | Status: DC | PRN
Start: 2016-04-19 — End: 2016-04-19
  Administered 2016-04-19: 55 mL via INTRAVENOUS

## 2016-04-19 MED ORDER — LIDOCAINE-EPINEPHRINE (PF) 1 %-1:200000 IJ SOLN
INTRAMUSCULAR | Status: AC
  Filled 2016-04-19: qty 30

## 2016-04-19 SURGICAL SUPPLY — 8 items
CANISTER PENUMBRA MAX (MISCELLANEOUS) ×2 IMPLANT
CATH VERT 100CM (CATHETERS) ×2 IMPLANT
DEVICE TORQUE (MISCELLANEOUS) ×2 IMPLANT
GLIDECATH F4/38/100ST (CATHETERS) ×2 IMPLANT
GLIDEWIRE ADV .035X260CM (WIRE) ×2 IMPLANT
PACK ANGIOGRAPHY (CUSTOM PROCEDURE TRAY) ×2 IMPLANT
SHEATH BRITE TIP 6FRX11 (SHEATH) ×2 IMPLANT
WIRE MAGIC TORQUE 260C (WIRE) ×4 IMPLANT

## 2016-04-19 NOTE — Progress Notes (Signed)
[facility] [place] NAME: Richard Jimenez    MR#:  [id number]  DATE OF BIRTH:  [birth year]  SUBJECTIVE:   hungry  REVIEW OF SYSTEMS:    Review of Systems  Constitutional: Negative for fever, chills and malaise/fatigue.  HENT: Negative for ear discharge, ear pain, hearing loss, nosebleeds and sore throat.   Eyes: Negative for blurred vision and pain.  Respiratory: Negative for cough, hemoptysis, shortness of breath and wheezing.   Cardiovascular: Negative for chest pain, palpitations and leg swelling.  Gastrointestinal: Negative for nausea, vomiting, abdominal pain, diarrhea and blood in stool.  Genitourinary: Negative for dysuria.  Musculoskeletal: Negative for back pain.  Neurological: Negative for dizziness, tremors, speech change, focal weakness, seizures and headaches.  Endo/Heme/Allergies: Does not bruise/bleed easily.  Psychiatric/Behavioral: Negative for depression, suicidal ideas and hallucinations.    Tolerating Diet:npo      DRUG ALLERGIES:   Allergies  Allergen Reactions  . Clarithromycin Nausea And Vomiting  . Penicillins Nausea And Vomiting, Rash and Other (See Comments)    Has patient had a PCN reaction causing immediate rash, facial/tongue/throat swelling, SOB or lightheadedness with hypotension: No Has patient had a PCN reaction causing severe rash involving mucus membranes or skin necrosis: No Has patient had a PCN reaction that required hospitalization No Has patient had a PCN reaction occurring within the last 10 years: No If all of the above answers are "NO", then may proceed with Cephalosporin use.    VITALS:  Blood pressure 130/44, pulse 78, temperature 98.7 F (37.1 C), temperature source Oral, resp. rate 20, height '5\' 9"'$  (1.753 m), weight 83.371 kg (183 lb 12.8 oz), SpO2 100 %.  PHYSICAL EXAMINATION:   Physical Exam  Constitutional: He is oriented to person, place, and time and well-developed,  well-nourished, and in no distress. No distress.  HENT:  Head: Normocephalic.  Eyes: No scleral icterus.  Left eye asymmetric  Neck: Normal range of motion. Neck supple. No JVD present. No tracheal deviation present.  Cardiovascular: Normal rate, regular rhythm and normal heart sounds.  Exam reveals no gallop and no friction rub.   No murmur heard. Pulmonary/Chest: Effort normal and breath sounds normal. No respiratory distress. He has no wheezes. He has no rales. He exhibits no tenderness.  Abdominal: Soft. Bowel sounds are normal. He exhibits no distension and no mass. There is no tenderness. There is no rebound and no guarding.  Musculoskeletal: Normal range of motion. He exhibits no edema.  Neurological: He is alert and oriented to person, place, and time.  Skin: Skin is warm. No rash noted. No erythema.  Psychiatric: Affect and judgment normal.      LABORATORY PANEL:   CBC  Recent Labs Lab 04/18/16 0835  WBC 8.3  HGB 13.8  HCT 39.8*  PLT 344   ------------------------------------------------------------------------------------------------------------------  Chemistries   Recent Labs Lab 04/18/16 0835  NA 134*  K 4.3  CL 96*  CO2 30  GLUCOSE 146*  BUN 13  CREATININE 0.83  CALCIUM 9.6  AST 22  ALT 18  ALKPHOS 53  BILITOT 0.6   ------------------------------------------------------------------------------------------------------------------  Cardiac Enzymes No results for input(s): TROPONINI in the last 168 hours. 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 Increasing neck pain and headaches. Initial encounter. EXAM: CT HEAD WITHOUT AND WITH CONTRAST TECHNIQUE: Contiguous axial images were obtained from the base of the skull  through the  vertex without and with intravenous contrast CONTRAST:  80 mL Isovue 370 COMPARISON:  Neck CT 04/06/2016.  Brain MRI 01/17/2016. FINDINGS: Visualized paranasal sinuses and mastoids are stable and well pneumatized. No suspicious osseous lesion identified. Negative visible orbit and scalp soft tissues. Calcified atherosclerosis at the skull base. No midline shift, mass effect, or evidence of intracranial mass lesion. No acute intracranial hemorrhage identified. 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 No cortically based acute infarct identified. No abnormal enhancement identified. Major intracranial vascular structures are enhancing. At the skullbase there is continued thrombosis of the right internal jugular bulb and visualized superior right IJ (series 7, image 1). The right sigmoid sinus appears to remain patent. IMPRESSION: 1.  No metastatic disease or acute intracranial abnormality. 2. Continued right IJ bulb thrombosis. The right sigmoid sinus appears to remain patent, see also neck CTA findings today reported separately. 3. 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 Electronically Signed   By: Genevie Ann M.D.   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 Increasing neck pain. Right jugular vein thrombosis diagnosed in June. Subsequent encounter. EXAM: CT ANGIOGRAPHY NECK TECHNIQUE: Multidetector CT imaging of the neck was performed using the standard protocol during bolus administration of intravenous contrast. Multiplanar CT image reconstructions and MIPs were obtained to evaluate the vascular anatomy. Carotid stenosis measurements (when applicable) are obtained utilizing NASCET criteria, using the distal internal carotid diameter as the denominator. CONTRAST:  80 mL Isovue 370 in conjunction with contrast enhanced imaging of the head  reported separately. COMPARISON:  CT head from today reported separately. Neck CT 04/06/2016 FINDINGS: Skeleton: Absent dentition. No osseous metastatic disease identified in the neck or at the skullbase. Abnormal bone mineralization of the left first rib is associated with the abnormal left lung apex, and stable. Visualized paranasal sinuses and mastoids are stable and well pneumatized. Other neck: Abnormal opacified left lung apex with mild cavitary changes and superimposed air bronchograms. Stable right upper lobe pulmonary nodule measuring 15 mm on series 4, image 229 today. Superimposed paraseptal emphysema. No superior mediastinal lymphadenopathy. Negative thyroid, pharynx, parapharyngeal spaces and retropharyngeal space. Evidence of left focal cord paralysis, otherwise negative visualized larynx. Sublingual spaces, submandibular glands, and parotid glands are within normal limits. No cervical lymphadenopathy. Continued right internal jugular vein thrombosis from the right IJ bulb to the junction with the right subclavian vein. The patient left upper extremity was injected, but the right innominate vein appears to remain patent. The SVC is patent. There are venous collaterals about the left chest wall, which appear related to left subclavian vein stenosis. However, the left innominate vein is normally patent. The left internal jugular vein is not opacified with contrast. The right sigmoid sinus remains patent. Aortic arch: 3 vessel arch configuration with extensive soft and calcified arch atherosclerosis. Right carotid system: No brachiocephalic artery or right CCA origin stenosis. Soft and calcified plaque in the mid right CCA with less than 50 % stenosis with respect to the distal vessel. Soft and calcified plaque at the right carotid bifurcation affecting the right ICA origin and resulting an stenosis up to 60-65 % with respect to the distal vessel. Otherwise negative cervical right ICA. Visible right ICA  siphon is patent with mild calcified plaque. Left carotid system: Calcified plaque at the left CCA origin with high-grade stenosis. See series  8, image 107. Despite this the left CCA remains patent, although the proximal left CCA is somewhat poorly enhancing. Enhancement appears more symmetric at the level of the left carotid bifurcation where there is calcified and soft plaque affecting left ICA origin and bulb, but resulting an less than 50 % stenosis with respect to the distal vessel. The cervical left ICA is non dominant compared to the right, but remains patent. The visible left ICA siphon is patent with calcified plaque. 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 Despite this, the right vertebral artery origin is normal. The right vertebral artery is patent to the skullbase without stenosis. The right PICA is patent. The vertebrobasilar junction is patent. The visible basilar artery is patent. High-grade stenosis in the proximal left subclavian artery about 1.5 cm beyond its origin, estimated at 80-90 percent (series 8, image 129). The left subclavian remains patent. Paravertebral venous Limits detail of the left vertebral artery origin and left V2 segment. The proximal left vertebral artery could be stenotic or segmentally occluded. The distal left vertebral artery is patent. There is left V3 calcified plaque. There is mild left V4 calcified plaque. The left PICA is patent. The distal left vertebral artery is diminutive beyond the PICA. IMPRESSION: 1. 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 However, the right sigmoid sinus and right innominate vein remain patent. 2. Severe atherosclerosis in the proximal great vessels and in the neck. High-grade stenosis at the left CCA origin and affecting both proximal subclavian arteries. 3. Hemodynamically significant atherosclerotic stenosis at the  right ICA origin estimated at 65%. 4. Dominant right vertebral artery without stenosis, the left is not well evaluated due to venous contamination. 5. 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 6. Stable visualized upper chest. No metastatic disease identified in the neck. Electronically Signed   By: Genevie Ann M.D.   On: 04/18/2016 12:22     ASSESSMENT AND PLAN:    75 year old male with a history of extensive small cell lung cancer on chemotherapy and palliative radiation presented with neck pain and found to have significant IJ DVT.   1. Right internal jugular vein DVT: Patient currently on heparin. Plan for thrombectomy this afternoon.  2. Small cell lung cancer: Patient is followed by oncology.   3. Diabetes: Continue sliding scale insulin.  4. Tobacco dependence: Continue nicotine patch.patient counseled for 3 minutes regarding stopping smoking.   Management plans discussed with the patient and he is in agreement.  CODE STATUS: partial  TOTAL TIME TAKING CARE OF THIS PATIENT: 30 minutes.     POSSIBLE D/C 1-2 days, DEPENDING ON CLINICAL CONDITION.   Areg Bialas M.D on 04/19/2016 at 10:52 AM  Between 7am to 6pm - Pager - [phone] After 6pm go to [email or web address] - password [email or web address] [facility]  Office  [phone]  CC: Primary care physician; Madelyn Brunner, MD  Note: This dictation was prepared with Dragon dictation along with smaller phrase technology. Any transcriptional errors that result from this process are unintentional.

## 2016-04-19 NOTE — Progress Notes (Signed)
Took over pt care at 1800, pt returned from vascular procedure, alert and oriented, pt with no noted complaints, no distress or discomfort noted, dressing to right groin dry and intact, family at bedside, pt voices any needs

## 2016-04-19 NOTE — H&P (Signed)
  [facility] History & Physical Update  The patient was interviewed and re-examined.  The patient's previous History and Physical has been reviewed and is unchanged.  There is no change in the plan of care. We plan to proceed with the scheduled procedure.  DEW,JASON, MD  04/19/2016, 3:45 PM

## 2016-04-19 NOTE — Progress Notes (Signed)
ANTICOAGULATION CONSULT NOTE - Initial Consult  Pharmacy Consult for Heparin  Indication: DVT ( Right IJ)  Allergies  Allergen Reactions  . Clarithromycin Nausea And Vomiting  . Penicillins Nausea And Vomiting, Rash and Other (See Comments)    Has patient had a PCN reaction causing immediate rash, facial/tongue/throat swelling, SOB or lightheadedness with hypotension: No Has patient had a PCN reaction causing severe rash involving mucus membranes or skin necrosis: No Has patient had a PCN reaction that required hospitalization No Has patient had a PCN reaction occurring within the last 10 years: No If all of the above answers are "NO", then may proceed with Cephalosporin use.   Patient Measurements: Height: '5\' 9"'$  (175.3 cm) Weight: 183 lb 12.8 oz (83.371 kg) IBW/kg (Calculated) : 70.7  Vital Signs: Temp: 98.6 F (37 C) (07/13 1400) Temp Source: Oral (07/13 1400) BP: 132/67 mmHg (07/13 1400) Pulse Rate: 76 (07/13 1400)  Labs:  Recent Labs  04/18/16 0835 04/18/16 1741 04/19/16 1258  HGB 13.8  --   --   HCT 39.8*  --   --   PLT 344  --   --   APTT  --  39* 72*  LABPROT  --  17.4*  --   INR  --  1.42  --   HEPARINUNFRC  --  >3.60* 0.76*  CREATININE 0.83  --   --    Estimated Creatinine Clearance: 78.1 mL/min (by C-G formula based on Cr of 0.83).  Medical History: Past Medical History  Diagnosis Date  . Myocardial infarct ([place])   . COPD (chronic obstructive pulmonary disease) ([place])   . Hypertension   . Diabetes mellitus without complication ([place])   . Asthma   . Lung cancer, upper lobe ([place]) 2009  . CML (chronic myelocytic leukemia) ([place])   . Diabetes mellitus type II, controlled ([place])   . Small cell lung cancer ([place])   . 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 Pharmacy consulted for heparin dosing and monitoring. Patient was taking Xarelto at home. Last reported dose was ~0700 the morning of admission.    Goal  of Therapy:  Heparin level 0.3-0.7 units/ml aPTT 66-102s seconds Monitor platelets by anticoagulation protocol: Yes   Plan:  7/13 HL = 0.76, remains elevated APTT = 72 (therapeutic)  Will continue heparin infusion at current rate of 1350 units/hr and check confirmatory aPTT in 8 hours.  Will order CBC with AM labs tomorrow  Lenis Noon, PharmD, BCPS Clinical Pharmacist 04/19/2016 2:21 PM

## 2016-04-19 NOTE — Progress Notes (Signed)
Patient has requested not to be awakened for svn treatments thru the night if he is sleeping.

## 2016-04-19 NOTE — Op Note (Signed)
[facility]   OPERATIVE NOTE   PRE-OPERATIVE DIAGNOSIS: extensive symptomatic right jugular vein DVT  POST-OPERATIVE DIAGNOSIS: same   PROCEDURE: 1. US guidance for vascular access to right femoral vein 2. Catheter placement into right jugular vein  from right femoral vein approach  3. SVC gram and right jugular venogram 4.   Catheter directed thrombolysis with 4 mg of tpa into the right jugular vein    SURGEON: DEW,JASON, MD  ASSISTANT(S): none  ANESTHESIA: local/MCS  ESTIMATED BLOOD LOSS: minimal  FINDING(S): 1. thrombosis of right jugular vein with extensive collaterals, right innominate vein and SVC patent  SPECIMEN(S): none  INDICATIONS:  Patient is a 75 y.o. male who presents with extensive right jugular DVT despite anticoagulation. Patient has marked headache and swelling. Venous intervention is performed to reduce the symptoms.  Risks and benefits are discussed and patient desires to proceed.   DESCRIPTION: After obtaining full informed written consent, the patient was brought back to the vascular suite and placed supine upon the table. The patient received IV antibiotics prior to induction. After obtaining adequate anesthesia, the patient was prepped and draped in the standard fashion. The right femoral vein was then accessed under US guidance and found to be widely patent. It was accessed without difficulty and a permanent image was recorded. I then placed the Magic torque wire through the atrium and into the superior vena cava. The patient was given 3000 units of intravenous heparin. With the catheter into the right innominate vein, central venous imaging showed the right innominate vein and superior vena cava to be patent I then advanced to the jugular vein origin and imaging was performed showing thrombosis of the jugular vein with no significant reflux into the internal jugular vein initially. I then navigated into the jugular  venous system. Extensive collaterals were found including the external jugular vein and anterior jugular vein which were large without thrombus. These were hypertrophied and larger than normal. There were other jugular collaterals that were also present. The catheter was put into the internal jugular vein with a mild amount of difficulty and 4 mg of TPA were then instilled through the 100 cm diagnostic catheter in the internal jugular vein. This was allowed to dwell for about 10 minutes. Imaging showed that there was still significant residual thrombosis but the vein actually seems somewhat atretic with a litany of collaterals and did not appear appropriate for mechanical thrombectomy at this point. I was fearful that any intervention beyond thrombolysis would actually do more damage to the collateral flow and not cause significant improvement. I felt that the TPA we had administered in addition to anticoagulation would be helpful to the patient. I then elected to terminate the procedure. The sheath was removed and a dressing was placed. He was taken to the recovery room in stable condition having tolerated the procedure well.   COMPLICATIONS: None  CONDITION: Stable  DEW,JASON 04/19/2016 5:07 PM

## 2016-04-19 NOTE — Progress Notes (Addendum)
ANTICOAGULATION CONSULT NOTE - Initial Consult  Pharmacy Consult for Heparin  Indication: DVT ( Right IJ)  Allergies  Allergen Reactions  . Clarithromycin Nausea And Vomiting  . Penicillins Nausea And Vomiting, Rash and Other (See Comments)    Has patient had a PCN reaction causing immediate rash, facial/tongue/throat swelling, SOB or lightheadedness with hypotension: No Has patient had a PCN reaction causing severe rash involving mucus membranes or skin necrosis: No Has patient had a PCN reaction that required hospitalization No Has patient had a PCN reaction occurring within the last 10 years: No If all of the above answers are "NO", then may proceed with Cephalosporin use.   Patient Measurements: Height: '5\' 9"'$  (175.3 cm) Weight: 183 lb 12.8 oz (83.371 kg) IBW/kg (Calculated) : 70.7  Vital Signs: Temp: 97.5 F (36.4 C) (07/13 2051) Temp Source: Oral (07/13 2051) BP: 107/53 mmHg (07/13 2051) Pulse Rate: 79 (07/13 2051)  Labs:  Recent Labs  04/18/16 0835 04/18/16 1741 04/19/16 1258 04/19/16 2116  HGB 13.8  --   --   --   HCT 39.8*  --   --   --   PLT 344  --   --   --   APTT  --  39* 72* 85*  LABPROT  --  17.4*  --   --   INR  --  1.42  --   --   HEPARINUNFRC  --  >3.60* 0.76*  --   CREATININE 0.83  --   --   --    Estimated Creatinine Clearance: 78.1 mL/min (by C-G formula based on Cr of 0.83).  Medical History: Past Medical History  Diagnosis Date  . Myocardial infarct ([place])   . COPD (chronic obstructive pulmonary disease) ([place])   . Hypertension   . Diabetes mellitus without complication ([place])   . Asthma   . Lung cancer, upper lobe ([place]) 2009  . CML (chronic myelocytic leukemia) ([place])   . Diabetes mellitus type II, controlled ([place])   . Small cell lung cancer ([place])   . 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 Pharmacy consulted for heparin dosing and monitoring. Patient was taking Xarelto at home. Last  reported dose was ~0700 the morning of admission.    Goal of Therapy:  Heparin level 0.3-0.7 units/ml aPTT 66-102s seconds Monitor platelets by anticoagulation protocol: Yes   Plan:  7/13 HL = 0.76, remains elevated APTT = 72 (therapeutic)  Will continue heparin infusion at current rate of 1350 units/hr and check confirmatory aPTT in 8 hours.  Will order CBC with AM labs tomorrow  7/13 :   APTT @ 21:16 = 85.  Will continue this pt on current rate of 1350 units/hr and recheck aPTT and HL on 7/14 with AM labs.   7/14 AM heparin level 0.67, aPTT 91. Continue current regimen and recheck with tomorrow AM labs. If levels continue to correlate will follow by anti-Xa only.  Orene Desanctis, PharmD Clinical Pharmacist 04/19/2016 10:12 PM

## 2016-04-19 NOTE — Consult Note (Signed)
[facility] Vascular Consult Note  MRN : [id number]  Richard Jimenez is a 75 y.o. ([birth year]) male who presents with chief complaint of No chief complaint on file. Marland Kitchen  History of Present Illness: I am asked by Dr. Bridgett Larsson and Dr. Rogue Bussing to see the patient regarding symptomatic right jugular vein thrombosis.  He had a port placed about two months ago for his lung cancer, and about two weeks ago developed right jugular vein thrombosis.  This has not improved despite the port being removed.  He reports headaches, sweating, and swelling in his head.  His CT scan showed a large residual right jugular vein DVT.  Also, he appears to have left innominate vein stenosis with collateral, likely explaining the severity of the symptoms. Also of note, carotid disease both in the ICA on the right and CCA on the left were seen on the CT scan.  He denies recent focal neurologic symptoms.  He has been on anticoagulation, but this has not helped.  Nothing seems to be making it better.  He reports no fever or chills.    Current Facility-Administered Medications  Medication Dose Route Frequency Provider Last Rate Last Dose  . 0.9 %  sodium chloride infusion   Intravenous Continuous Demetrios Loll, MD 75 mL/hr at 04/19/16 [phone]    . [MAR Hold] acetaminophen (TYLENOL) tablet 650 mg  650 mg Oral Q6H PRN Demetrios Loll, MD       Or  . Doug Sou Hold] acetaminophen (TYLENOL) suppository 650 mg  650 mg Rectal Q6H PRN Demetrios Loll, MD      . Doug Sou Hold] albuterol (PROVENTIL) (2.5 MG/3ML) 0.083% nebulizer solution 2.5 mg  2.5 mg Nebulization Q2H PRN Demetrios Loll, MD      . Doug Sou Hold] albuterol (PROVENTIL) (2.5 MG/3ML) 0.083% nebulizer solution 3 mL  3 mL Inhalation Q6H PRN Demetrios Loll, MD      . Doug Sou Hold] ALPRAZolam Duanne Moron) tablet 0.5 mg  0.5 mg Oral TID PRN Demetrios Loll, MD      . clindamycin (CLEOCIN) 300 MG/50ML IVPB           . heparin ADULT infusion 100 units/mL (25000 units/287m sodium chloride 0.45%)  16 Units/kg/hr  Intravenous Continuous Sheema M Hallaji, RPH 13.5 mL/hr at 04/19/16 0752 16 Units/kg/hr at 04/19/16 0752  . [MAR Hold] HYDROcodone-acetaminophen (NORCO/VICODIN) 5-325 MG per tablet 1-2 tablet  1-2 tablet Oral Q4H PRN QDemetrios Loll MD      . [Doug SouHold] insulin aspart (novoLOG) injection 0-5 Units  0-5 Units Subcutaneous QHS QDemetrios Loll MD   0 Units at 04/18/16 2200  . [MAR Hold] insulin aspart (novoLOG) injection 0-9 Units  0-9 Units Subcutaneous TID WC QDemetrios Loll MD   1 Units at 04/19/16 1203  . [MAR Hold] ipratropium-albuterol (DUONEB) 0.5-2.5 (3) MG/3ML nebulizer solution 3 mL  3 mL Nebulization Q4H Sital Mody, MD   3 mL at 04/19/16 1055  . [MAR Hold] magic mouthwash  5 mL Oral QID PRN QDemetrios Loll MD      . [Doug SouHold] morphine 4 MG/ML injection 4 mg  4 mg Intravenous Q4H PRN QDemetrios Loll MD      . [Doug SouHold] nicotine (NICODERM CQ - dosed in mg/24 hours) patch 14 mg  14 mg Transdermal Daily QDemetrios Loll MD   14 mg at 04/19/16 0753  . [MAR Hold] ondansetron (ZOFRAN) tablet 4 mg  4 mg Oral Q6H PRN QDemetrios Loll MD       Or  . [Doug Sou  Hold] ondansetron (ZOFRAN) injection 4 mg  4 mg Intravenous Q6H PRN Demetrios Loll, MD      . Doug Sou Hold] prochlorperazine (COMPAZINE) tablet 10 mg  10 mg Oral Q6H PRN Demetrios Loll, MD      . Doug Sou Hold] traMADol Veatrice Bourbon) tablet 50 mg  50 mg Oral Q6H PRN Demetrios Loll, MD        Past Medical History  Diagnosis Date  . Myocardial infarct ([place])   . COPD (chronic obstructive pulmonary disease) ([place])   . Hypertension   . Diabetes mellitus without complication ([place])   . Asthma   . Lung cancer, upper lobe ([place]) 2009  . CML (chronic myelocytic leukemia) ([place])   . Diabetes mellitus type II, controlled ([place])   . Small cell lung cancer ([place])   . 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 Throat surgery    . Tumor removal Right     "years ago-right lower posterior side"  . Peripheral vascular catheterization N/A 02/21/2016    Procedure: Glori Luis  Cath Insertion;  Surgeon: Algernon Huxley, MD;  Location: [facility];  Service: Cardiovascular;  Laterality: N/A;  . Port-a-cath removal N/A 04/07/2016    Procedure: REMOVAL PORT-A-CATH;  Surgeon: Serafina Mitchell, MD;  Location: [facility];  Service: Vascular;  Laterality: N/A;    Social History Social History  Substance Use Topics  . Smoking status: Current Every Day Smoker -- 1.00 packs/day for 62 years    Types: Cigarettes  . Smokeless tobacco: None  . Alcohol Use: 3.6 oz/week    6 Cans of beer per week     Comment: rarely  No IVDU  Family History Family History  Problem Relation Age of Onset  . Cancer - Colon Father   . Cancer - Colon Sister   no bleeding or clotting disorders  Allergies  Allergen Reactions  . Clarithromycin Nausea And Vomiting  . Penicillins Nausea And Vomiting, Rash and Other (See Comments)    Has patient had a PCN reaction causing immediate rash, facial/tongue/throat swelling, SOB or lightheadedness with hypotension: No Has patient had a PCN reaction causing severe rash involving mucus membranes or skin necrosis: No Has patient had a PCN reaction that required hospitalization No Has patient had a PCN reaction occurring within the last 10 years: No If all of the above answers are "NO", then may proceed with Cephalosporin use.     REVIEW OF SYSTEMS (Negative unless checked)  Constitutional: '[]'$ Weight loss  '[]'$ Fever  '[]'$ Chills Cardiac: '[]'$ Chest pain   '[]'$ Chest pressure   '[]'$ Palpitations   '[]'$ Shortness of breath when laying flat   '[]'$ Shortness of breath at rest   '[x]'$ Shortness of breath with exertion. Vascular:  '[]'$ Pain in legs with walking   '[]'$ Pain in legs at rest   '[]'$ Pain in legs when laying flat   '[]'$ Claudication   '[]'$ Pain in feet when walking  '[]'$ Pain in feet at rest  '[]'$ Pain in feet when laying flat   '[]'$ History of DVT   '[]'$ Phlebitis   '[]'$ Swelling in legs   '[]'$ Varicose veins   '[]'$ Non-healing ulcers Pulmonary:   '[]'$ Uses home oxygen   '[x]'$ Productive cough   '[]'$ Hemoptysis    '[]'$ Wheeze  '[]'$ COPD   '[]'$ Asthma Neurologic:  '[]'$ Dizziness  '[]'$ Blackouts   '[]'$ Seizures   '[]'$ History of stroke   '[]'$ History of TIA  '[]'$ Aphasia   '[]'$ Temporary blindness   '[]'$ Dysphagia   '[]'$ Weakness or numbness in arms   '[]'$ Weakness or numbness in legs Musculoskeletal:  '[]'$   Arthritis   '[]'$ Joint swelling   '[]'$ Joint pain   '[]'$ Low back pain Hematologic:  '[]'$ Easy bruising  '[]'$ Easy bleeding   '[]'$ Hypercoagulable state   '[]'$ Anemic  '[]'$ Hepatitis Gastrointestinal:  '[]'$ Blood in stool   '[]'$ Vomiting blood  '[]'$ Gastroesophageal reflux/heartburn   '[]'$ Difficulty swallowing. Genitourinary:  '[]'$ Chronic kidney disease   '[]'$ Difficult urination  '[]'$ Frequent urination  '[]'$ Burning with urination   '[]'$ Blood in urine Skin:  '[]'$ Rashes   '[]'$ Ulcers   '[]'$ Wounds Psychological:  '[]'$ History of anxiety   '[]'$  History of major depression.  Physical Examination  Filed Vitals:   04/19/16 0557 04/19/16 1057 04/19/16 1400 04/19/16 1450  BP: 130/44  132/67 130/80  Pulse: 78  76 80  Temp: 98.7 F (37.1 C)  98.6 F (37 C) 98.1 F (36.7 C)  TempSrc: Oral  Oral Oral  Resp: '20  18 16  '$ Height:      Weight:      SpO2: 100% 97% 100% 98%   Body mass index is 27.13 kg/(m^2). Gen:  WD/WN, NAD Head: [place]/AT, No temporalis wasting. Prominent temp pulse not noted. Ear/Nose/Throat: Hearing grossly intact, nares w/o erythema or drainage, oropharynx w/o Erythema/Exudate Eyes: PERRLA, EOMI.  Neck: Supple, no nuchal rigidity.  No JVD.  Pulmonary:  Good air movement, equal bilaterally.  Cardiac: RRR, normal S1, S2 Vascular:  Vessel Right Left  Radial Palpable Palpable  Ulnar Palpable Palpable  Brachial Palpable Palpable  Carotid Palpable, with bruit Palpable, without bruit  Aorta Not palpable N/A                   Gastrointestinal: soft, non-tender/non-distended. No guarding/reflex. No masses, surgical incisions, or scars. Musculoskeletal: M/S 5/5 throughout.  Extremities without ischemic changes.  No deformity or atrophy. No edema. Neurologic: CN 2-12 intact. Pain and  light touch intact in extremities.  Symmetrical.  Speech is fluent. Motor exam as listed above. Psychiatric: Judgment intact, Mood & affect appropriate for pt's clinical situation. Dermatologic: No rashes or ulcers noted.  No cellulitis or open wounds. Lymph : No Cervical, Axillary, or Inguinal lymphadenopathy.     CBC Lab Results  Component Value Date   WBC 8.3 04/18/2016   HGB 13.8 04/18/2016   HCT 39.8* 04/18/2016   MCV 82.6 04/18/2016   PLT 344 04/18/2016    BMET    Component Value Date/Time   NA 134* 04/18/2016 0835   K 4.3 04/18/2016 0835   CL 96* 04/18/2016 0835   CO2 30 04/18/2016 0835   GLUCOSE 146* 04/18/2016 0835   BUN 13 04/18/2016 0835   CREATININE 0.83 04/18/2016 0835   CREATININE 0.99 02/26/2014 1033   CALCIUM 9.6 04/18/2016 0835   CALCIUM 8.2* 02/26/2014 1033   GFRNONAA >60 04/18/2016 0835   GFRNONAA >60 02/26/2014 1033   GFRAA >60 04/18/2016 0835   GFRAA >60 02/26/2014 1033   Estimated Creatinine Clearance: 78.1 mL/min (by C-G formula based on Cr of 0.83).  COAG Lab Results  Component Value Date   INR 1.42 04/18/2016   INR 1.09 04/06/2016   INR 0.96 01/13/2016    Radiology Ct Head W & Wo Contrast  04/18/2016  CLINICAL DATA:  75 year old male with extensive stage small cell lung cancer undergoing treatment. Increasing neck pain and headaches. Initial encounter. EXAM: CT HEAD WITHOUT AND WITH CONTRAST TECHNIQUE: Contiguous axial images were obtained from the base of the skull through the vertex without and with intravenous contrast CONTRAST:  80 mL Isovue 370 COMPARISON:  Neck CT 04/06/2016.  Brain MRI 01/17/2016. FINDINGS: Visualized paranasal sinuses and mastoids  are stable and well pneumatized. No suspicious osseous lesion identified. Negative visible orbit and scalp soft tissues. Calcified atherosclerosis at the skull base. No midline shift, mass effect, or evidence of intracranial mass lesion. No acute intracranial hemorrhage identified. 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 No cortically based acute infarct identified. No abnormal enhancement identified. Major intracranial vascular structures are enhancing. At the skullbase there is continued thrombosis of the right internal jugular bulb and visualized superior right IJ (series 7, image 1). The right sigmoid sinus appears to remain patent. IMPRESSION: 1.  No metastatic disease or acute intracranial abnormality. 2. Continued right IJ bulb thrombosis. The right sigmoid sinus appears to remain patent, see also neck CTA findings today reported separately. 3. 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 Electronically Signed   By: Genevie Ann M.D.   On: 04/18/2016 12:08   Ct Soft Tissue Neck W Contrast  04/06/2016  CLINICAL DATA:  76 year old male with right neck pain and swelling since 04/03/2016. Lung cancer post chemotherapy. Left vocal cord surgery 4 months ago. Initial encounter. EXAM: CT NECK WITH CONTRAST TECHNIQUE: Multidetector CT imaging of the neck was performed using the standard protocol following the bolus administration of intravenous contrast. CONTRAST:  44m ISOVUE-300 IOPAMIDOL (ISOVUE-300) INJECTION 61% COMPARISON:  07/19/2011 neck CT. 01/17/2016 brain MR. 01/17/2016 PET CT. FINDINGS: Pharynx and larynx: Mild impression upon the right aspect of the palatine tonsil/ pharynx by the inflamed enlarged right internal jugular vein. Prominence of the palatine tonsils and lingual tonsils without definitive mass identified. Marked narrowing supraglottic airway. Teflon left vocal cord. Heterogeneous appearance of the thyroid cartilage similar to prior exam. Salivary glands: No primary worrisome mass. Thyroid: No worrisome dominant mass. Lymph nodes: Interval enlargement of lymph node posterior right submandibular gland immediately anterior to the thrombosed inflamed right internal jugular vein may be reactive in origin. Attention to this on follow-up.  Previously noted left level 4 and level 5 malignant adenopathy has decreased in size now measuring up to 1.5 cm versus prior 2.5 cm. Vascular: New from the 01/17/2016 brain MR is thrombosis of the right internal jugular vein. This extends from the level of the skull base (right jugular fossa) throughout the neck to level of the right innominate vein. A right central port is in place entering the thrombosed right internal jugular vein with the tip not imaged on the current exam. The thrombosed right internal jugular vein is enlarged and inflamed with adjacent reactive changes. Right carotid artery displaced by a thrombosed right internal jugular vein. 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 Moderate narrowing left common carotid artery. Left carotid bifurcation calcifications with less than 60% diameter stenosis. Aortic atherosclerosis. Limited intracranial: No enhancing lesion. Visualized orbits: Negative. Mastoids and visualized paranasal sinuses: Clear. 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 Upper chest: Left upper lobe consolidation consistent with post therapy changes. The previously noted nodular components along the anterior and posterior aspect of the left thorax are less well delineated on current exam however, right upper lobe nodular density has enlarged slightly now with maximal transverse dimension of 1.6 cm versus prior 1.3 cm. 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 Port enters this thrombosed vessel. The vessel is enlarged and inflamed. Patient is at risk for  right sigmoid sinus thrombosis. Decrease size of left level 4 and 5 lymph nodes and nodularity left upper lung. Slight increase  in size of right nodularity (current 1.6 cm versus prior 1.3 cm). New enlarged lymph node posterior right submandibular gland may be reactive in origin given the adjacent thrombosed vessel rather than metastatic in origin. Attention to this on follow-up. Aortic atherosclerosis 66% diameter stenosis proximal right internal carotid artery and moderate narrowing just below the skullbase. Moderate narrowing left common carotid artery. Left carotid bifurcation calcifications with less than 60% diameter stenosis. These results were called by telephone at the time of interpretation on 04/06/2016 at 4:34 pm to Dr. Charlaine Dalton , who verbally acknowledged these results. Electronically Signed   By: Genia Del M.D.   On: 04/06/2016 16:59   Ct Angio Neck W Or Wo Contrast  04/18/2016  CLINICAL DATA:  75 year old male with extensive stage small cell lung cancer undergoing treatment. Increasing neck pain. Right jugular vein thrombosis diagnosed in June. Subsequent encounter. EXAM: CT ANGIOGRAPHY NECK TECHNIQUE: Multidetector CT imaging of the neck was performed using the standard protocol during bolus administration of intravenous contrast. Multiplanar CT image reconstructions and MIPs were obtained to evaluate the vascular anatomy. Carotid stenosis measurements (when applicable) are obtained utilizing NASCET criteria, using the distal internal carotid diameter as the denominator. CONTRAST:  80 mL Isovue 370 in conjunction with contrast enhanced imaging of the head reported separately. COMPARISON:  CT head from today reported separately. Neck CT 04/06/2016 FINDINGS: Skeleton: Absent dentition. No osseous metastatic disease identified in the neck or at the skullbase. Abnormal bone mineralization of the left first rib is associated with the abnormal left lung apex, and stable. Visualized paranasal sinuses and mastoids are stable and well pneumatized. Other neck: Abnormal opacified left lung apex with mild cavitary changes  and superimposed air bronchograms. Stable right upper lobe pulmonary nodule measuring 15 mm on series 4, image 229 today. Superimposed paraseptal emphysema. No superior mediastinal lymphadenopathy. Negative thyroid, pharynx, parapharyngeal spaces and retropharyngeal space. Evidence of left focal cord paralysis, otherwise negative visualized larynx. Sublingual spaces, submandibular glands, and parotid glands are within normal limits. No cervical lymphadenopathy. Continued right internal jugular vein thrombosis from the right IJ bulb to the junction with the right subclavian vein. The patient left upper extremity was injected, but the right innominate vein appears to remain patent. The SVC is patent. There are venous collaterals about the left chest wall, which appear related to left subclavian vein stenosis. However, the left innominate vein is normally patent. The left internal jugular vein is not opacified with contrast. The right sigmoid sinus remains patent. Aortic arch: 3 vessel arch configuration with extensive soft and calcified arch atherosclerosis. Right carotid system: No brachiocephalic artery or right CCA origin stenosis. Soft and calcified plaque in the mid right CCA with less than 50 % stenosis with respect to the distal vessel. Soft and calcified plaque at the right carotid bifurcation affecting the right ICA origin and resulting an stenosis up to 60-65 % with respect to the distal vessel. Otherwise negative cervical right ICA. Visible right ICA siphon is patent with mild calcified plaque. Left carotid system: Calcified plaque at the left CCA origin with high-grade stenosis. See series 8, image 107. Despite this the left CCA remains patent, although the proximal left CCA is somewhat poorly enhancing. Enhancement appears more symmetric at the level of the left carotid bifurcation where there is calcified and soft plaque affecting left ICA origin and bulb, but resulting an  less than 50 % stenosis with  respect to the distal vessel. The cervical left ICA is non dominant compared to the right, but remains patent. The visible left ICA siphon is patent with calcified plaque. 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 Despite this, the right vertebral artery origin is normal. The right vertebral artery is patent to the skullbase without stenosis. The right PICA is patent. The vertebrobasilar junction is patent. The visible basilar artery is patent. High-grade stenosis in the proximal left subclavian artery about 1.5 cm beyond its origin, estimated at 80-90 percent (series 8, image 129). The left subclavian remains patent. Paravertebral venous Limits detail of the left vertebral artery origin and left V2 segment. The proximal left vertebral artery could be stenotic or segmentally occluded. The distal left vertebral artery is patent. There is left V3 calcified plaque. There is mild left V4 calcified plaque. The left PICA is patent. The distal left vertebral artery is diminutive beyond the PICA. IMPRESSION: 1. 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 However, the right sigmoid sinus and right innominate vein remain patent. 2. Severe atherosclerosis in the proximal great vessels and in the neck. High-grade stenosis at the left CCA origin and affecting both proximal subclavian arteries. 3. Hemodynamically significant atherosclerotic stenosis at the right ICA origin estimated at 65%. 4. Dominant right vertebral artery without stenosis, the left is not well evaluated due to venous contamination. 5. 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 6. Stable visualized upper chest. No metastatic disease identified in the neck. Electronically Signed   By: Genevie Ann M.D.   On: 04/18/2016 12:22   Ct Chest W Contrast  03/30/2016  CLINICAL DATA:  Restaging small cell lung  cancer post 2 rounds a chemotherapy and radiation therapy. History of left upper lobe lung cancer in 2009. EXAM: CT CHEST WITH CONTRAST TECHNIQUE: Multidetector CT imaging of the chest was performed during intravenous contrast administration. CONTRAST:  88m ISOVUE-300 IOPAMIDOL (ISOVUE-300) INJECTION 61% COMPARISON:  Chest CT 01/09/2016.  PET-CT 01/17/2016. FINDINGS: Cardiovascular: There is extensive atherosclerosis of the aorta, great vessels and coronary arteries. There is probable ostial stenosis at the origin of the right brachiocephalic artery. The pulmonary arteries appear normal. No acute vascular findings are seen. The heart size is normal. There is no pericardial effusion. Mediastinum/Nodes: The previously demonstrated level 4 and 5 lower cervical adenopathy has resolved. The previous demonstrated hypermetabolic mediastinal and hilar adenopathy has improved. Left superior mediastinal node measures 9 mm on image 25, previously 17 mm. A right hilar node measures 12 mm on image 68, previously 16 mm. The left cardiophrenic angle node has also decreased in size, measuring 9 mm on image 124. There is no residual axillary adenopathy. The thyroid gland, trachea and esophagus demonstrate no significant findings. Lungs/Pleura: There is no pleural effusion. The previously demonstrated mass anteriorly in the left upper lobe is significantly smaller, although partly obscured by adjacent radiation changes. There is no discrete measurable lesion in this area. The other left-sided nodules have also resolved. The underlying radiation changes in the left upper lobe are stable. The right upper lobe pulmonary nodule measures 14 x 10 mm on image 47 (previously 13 x 9 mm). This right-sided nodule has been present on CTs dating back to 2013 and did not appear hypermetabolic on PET-CT. There is stable underlying chronic lung disease with emphysema and subpleural reticulation. Upper abdomen: The  visualized upper abdomen appears  stable with stable bilateral adrenal adenomas. There are no worrisome hepatic findings. Musculoskeletal/Chest wall: There is some periosteal thickening of the left first and second ribs, likely related to radiation therapy. No bone destruction or pathologic fracture identified. IMPRESSION: 1. Significant response to interval therapy with near-complete resolution of large hypermetabolic left upper lobe pulmonary mass, lower cervical, mediastinal and left axillary lymphadenopathy. 2. Underlying radiation changes in the left upper lobe are again noted with associated sclerosis of the left first and second ribs. 3. The additional left lung nodularity has also resolved. A right upper lobe pulmonary nodule appears minimally larger, although has been present since 2013, likely benign. Electronically Signed   By: Richardean Sale M.D.   On: 03/30/2016 09:40      Assessment/Plan 1. Right IJ DVT. Symptomatic and not improved despite anticoagulation and port removal.  Discussed thrombectomy/thrombolysis and he would like to have this done.  I think his left innominate vein stenosis/occlusion increased his venous pressure and symptoms 2. Carotid disease.  Bilateral. No new symptoms.  No immediate treatment planned, will follow as outpatient 3. Lung cancer. Likely underlying cause of hypercoagulable state 4. DM. Stable. On outpatient medications   DEW,JASON, MD  04/19/2016 3:50 PM

## 2016-04-19 NOTE — Care Management (Addendum)
Admitted to [facility] with the diagnosis of DVT. Lives with wife, Thayer Headings, [phone]). Last seen Dr. Lisette Grinder 04/17/16. No home health. No skilled facility. No home oxygen. Takes care of all basic and instrumental activities of daily living himself, drives. No falls. O.K. Appetite. Uses no aids for ambulation. Prescriptions are filled at [facility]. Wife will transport. Possible thrombectomy this afternoon. Shelbie Ammons RN MSN CCM Care Management [phone]

## 2016-04-19 NOTE — Progress Notes (Signed)
Pt clinically stable post procedure, right groin without bleeding nor hematoma, awake alert and oriented, family all present. Dr Lucky Cowboy out to speak with patient and family, questions answered, repor t called to care nurse with plan reviewed, denies complaints.

## 2016-04-20 ENCOUNTER — Other Ambulatory Visit: Payer: Self-pay | Admitting: Internal Medicine

## 2016-04-20 ENCOUNTER — Telehealth: Payer: Self-pay | Admitting: *Deleted

## 2016-04-20 ENCOUNTER — Encounter: Payer: Self-pay | Admitting: Vascular Surgery

## 2016-04-20 ENCOUNTER — Inpatient Hospital Stay: Payer: Medicare Other

## 2016-04-20 DIAGNOSIS — I82890 Acute embolism and thrombosis of other specified veins: Secondary | ICD-10-CM

## 2016-04-20 LAB — BASIC METABOLIC PANEL
ANION GAP: 7 (ref 5–15)
BUN: 13 mg/dL (ref 6–20)
CHLORIDE: 102 mmol/L (ref 101–111)
CO2: 25 mmol/L (ref 22–32)
CREATININE: 0.78 mg/dL (ref 0.61–1.24)
Calcium: 8.3 mg/dL — ABNORMAL LOW (ref 8.9–10.3)
GFR calc non Af Amer: >60 mL/min (ref >60)
Glucose, Bld: 136 mg/dL — ABNORMAL HIGH (ref 65–99)
POTASSIUM: 4 mmol/L (ref 3.5–5.1)
Sodium: 134 mmol/L — ABNORMAL LOW (ref 135–145)

## 2016-04-20 LAB — HEPARIN LEVEL (UNFRACTIONATED): HEPARIN UNFRACTIONATED: 0.67 [IU]/mL (ref 0.30–0.70)

## 2016-04-20 LAB — CBC
HEMATOCRIT: 35.8 % — AB (ref 40.0–52.0)
HEMOGLOBIN: 12.4 g/dL — AB (ref 13.0–18.0)
MCH: 28.9 pg (ref 26.0–34.0)
MCHC: 34.6 g/dL (ref 32.0–36.0)
MCV: 83.3 fL (ref 80.0–100.0)
Platelets: 244 10*3/uL (ref 150–440)
RBC: 4.29 MIL/uL — AB (ref 4.40–5.90)
RDW: 16.8 % — ABNORMAL HIGH (ref 11.5–14.5)
WBC: 6.7 10*3/uL (ref 3.8–10.6)

## 2016-04-20 LAB — GLUCOSE, CAPILLARY: GLUCOSE-CAPILLARY: 180 mg/dL — AB (ref 65–99)

## 2016-04-20 LAB — APTT: APTT: 91 s — AB (ref 24–36)

## 2016-04-20 MED ORDER — APIXABAN 5 MG PO TABS
10.0000 mg | ORAL_TABLET | Freq: Two times a day (BID) | ORAL | Status: DC
  Administered 2016-04-20: 10:00:00 10 mg via ORAL
  Filled 2016-04-20: qty 2

## 2016-04-20 MED ORDER — NICOTINE 14 MG/24HR TD PT24: 14.0000 mg | MEDICATED_PATCH | Freq: Every day | TRANSDERMAL | Status: DC

## 2016-04-20 MED ORDER — APIXABAN 5 MG PO TABS: 5.0000 mg | ORAL_TABLET | Freq: Two times a day (BID) | ORAL | Status: DC

## 2016-04-20 MED ORDER — IPRATROPIUM-ALBUTEROL 0.5-2.5 (3) MG/3ML IN SOLN: 3.0000 mL | RESPIRATORY_TRACT | Status: DC | PRN

## 2016-04-20 MED ORDER — ENOXAPARIN SODIUM 80 MG/0.8ML ~~LOC~~ SOLN: 80.0000 mg | Freq: Two times a day (BID) | SUBCUTANEOUS | Status: DC

## 2016-04-20 NOTE — Care Management Important Message (Signed)
Important Message  Patient Details  Name: Richard Jimenez MRN: [id number] Date of Birth: 1941-03-30   Medicare Important Message Given:  Yes    Juliann Pulse A Jenin Birdsall 04/20/2016, 10:56 AM

## 2016-04-20 NOTE — Progress Notes (Signed)
ANTICOAGULATION CONSULT NOTE - Initial Consult  Pharmacy Consult for apixaban Indication: VTE treatment  Allergies  Allergen Reactions  . Clarithromycin Nausea And Vomiting  . Penicillins Nausea And Vomiting, Rash and Other (See Comments)    Has patient had a PCN reaction causing immediate rash, facial/tongue/throat swelling, SOB or lightheadedness with hypotension: No Has patient had a PCN reaction causing severe rash involving mucus membranes or skin necrosis: No Has patient had a PCN reaction that required hospitalization No Has patient had a PCN reaction occurring within the last 10 years: No If all of the above answers are "NO", then may proceed with Cephalosporin use.    Patient Measurements: Height: '5\' 9"'$  (175.3 cm) Weight: 183 lb 12.8 oz (83.371 kg) IBW/kg (Calculated) : 70.7   Vital Signs: Temp: 98.4 F (36.9 C) (07/14 0404) Temp Source: Oral (07/14 0404) BP: 143/61 mmHg (07/14 0404) Pulse Rate: 73 (07/14 0404)  Labs:  Recent Labs  04/18/16 0835  04/18/16 1741 04/19/16 1258 04/19/16 2116 04/20/16 0502  HGB 13.8  --   --   --   --  12.4*  HCT 39.8*  --   --   --   --  35.8*  PLT 344  --   --   --   --  244  APTT  --   < > 39* 72* 85* 91*  LABPROT  --   --  17.4*  --   --   --   INR  --   --  1.42  --   --   --   HEPARINUNFRC  --   --  >3.60* 0.76*  --  0.67  CREATININE 0.83  --   --   --   --  0.78  < > = values in this interval not displayed.  Estimated Creatinine Clearance: 81 mL/min (by C-G formula based on Cr of 0.78).   Medical History: Past Medical History  Diagnosis Date  . Myocardial infarct ([place])   . COPD (chronic obstructive pulmonary disease) ([place])   . Hypertension   . Diabetes mellitus without complication ([place])   . Asthma   . 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 CML (chronic myelocytic leukemia) ([place])   . Diabetes mellitus type II, controlled ([place])   . Small cell lung cancer ([place])   . 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 apixaban  10 mg Oral BID   Followed by  . [START ON 04/27/2016] apixaban  5 mg Oral BID  . insulin aspart  0-5 Units Subcutaneous QHS  . insulin aspart  0-9 Units Subcutaneous TID WC  . nicotine  14 mg Transdermal Daily    Assessment: Pharmacy consulted to dose apixaban in a 75 yo male with right IJ DVT.  Previously receiving Xarelto as an outpatient for treatment of IJ DVT. Patient transitioned to Heparin drip upon admission and MD wishes to switch DOAC to apixaban.   Goal of Therapy:  Monitor SCr and CBC per policy   Plan:  Will order apixaban 10 mg po BID x 7 days then transition to apixaban 5 mg po BID.    Pharmacy will continue to follow.   Debora Stockdale G 04/20/2016,9:35 AM

## 2016-04-20 NOTE — Care Management (Signed)
Discharge to home today per Dr. Benjie Karvonen. Coupon for Eliquis given to Richard Jimenez. Richard Jimenez states he has Medicare as his insurance, but not part D. Wife will transport. Richard Ammons RN MSN CCM Care Management [phone]

## 2016-04-20 NOTE — Progress Notes (Signed)
ANTICOAGULATION CONSULT NOTE - Follow Up Consult  Pharmacy Consult for Heparin  Indication: DVT ( Right IJ)  Allergies  Allergen Reactions  . Clarithromycin Nausea And Vomiting  . Penicillins Nausea And Vomiting, Rash and Other (See Comments)    Has patient had a PCN reaction causing immediate rash, facial/tongue/throat swelling, SOB or lightheadedness with hypotension: No Has patient had a PCN reaction causing severe rash involving mucus membranes or skin necrosis: No Has patient had a PCN reaction that required hospitalization No Has patient had a PCN reaction occurring within the last 10 years: No If all of the above answers are "NO", then may proceed with Cephalosporin use.   Patient Measurements: Height: '5\' 9"'$  (175.3 cm) Weight: 183 lb 12.8 oz (83.371 kg) IBW/kg (Calculated) : 70.7  Vital Signs: Temp: 98.4 F (36.9 C) (07/14 0404) Temp Source: Oral (07/14 0404) BP: 143/61 mmHg (07/14 0404) Pulse Rate: 73 (07/14 0404)  Labs:  Recent Labs  04/18/16 0835  04/18/16 1741 04/19/16 1258 04/19/16 2116 04/20/16 0502  HGB 13.8  --   --   --   --  12.4*  HCT 39.8*  --   --   --   --  35.8*  PLT 344  --   --   --   --  244  APTT  --   < > 39* 72* 85* 91*  LABPROT  --   --  17.4*  --   --   --   INR  --   --  1.42  --   --   --   HEPARINUNFRC  --   --  >3.60* 0.76*  --  0.67  CREATININE 0.83  --   --   --   --  0.78  < > = values in this interval not displayed. Estimated Creatinine Clearance: 81 mL/min (by C-G formula based on Cr of 0.78).  Medical History: Past Medical History  Diagnosis Date  . Myocardial infarct ([place])   . COPD (chronic obstructive pulmonary disease) ([place])   . Hypertension   . Diabetes mellitus without complication ([place])   . Asthma   . 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 CML (chronic myelocytic leukemia) ([place])   . Diabetes mellitus type II, controlled ([place])   . Small cell lung cancer ([place])   . 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 Pharmacy consulted for heparin dosing and monitoring. Patient was taking Xarelto at home. Last reported dose was ~0700 the morning of admission.   HL at 0502: 0.67, aPTT: 91  Goal of Therapy:  Heparin level 0.3-0.7 units/ml aPTT 66-102s seconds Monitor platelets by anticoagulation protocol: Yes   Plan:  HL within goal range and aPTT within goal range.  Values now correlate.  Will assume effects of Xarelto are diminished and will continue to monitor with HLs.  Will order a HL in 6 hours for confirmation and if within goal range, will transition to daily levels.    Loleta Dicker, PharmD Clinical Pharmacist 04/20/2016 8:03 AM

## 2016-04-20 NOTE — Progress Notes (Unsigned)
PSN informed patient's wife that she will need to go to [facility] between 5:30pm and 5:45pm, today, to pick up a 5 day supply of the '80mg'$  lovenox needed for the patient.  PSN informed her that the phamacy closes at 6pm, so do not get there any later than 5:45pm.  PSN informed her that there would be no cost since it will be paid for through the [facility].  [facility] will order the remaining 25 day supply needed.  This will also be paid for through the [place].

## 2016-04-20 NOTE — Telephone Encounter (Signed)
Contacted pt per v/o Dr. Rogue Bussing.  Patient went home from [facility] with rx for eliquis with copay 1 month supply card.  Dr. Rogue Bussing requested pt not to start on this rx. He would like pt to be on Lovenox 80 mg 1 injection every 12 hours.  Elease Etienne, Social Radiographer, therapeutic for [facility] ok to send RX for [employer] to cover Lovenox rx.  Lovenox rx sent to [facility].  RX orginally sent to [facility] for Lovenox was cnl. I informed Hyman Hopes that [facility] will provide rx free.  Wife instructed to have patient take first dose of Lovenox at 10 pm tonight.  Last dose of oral eloquis was taken at 10am per pt's wife.  Wife instructed not to filled eliquis at this time.

## 2016-04-20 NOTE — Discharge Summary (Signed)
[facility] [place] NAME: Richard Jimenez    MR#:  [id number]  DATE OF BIRTH:  September 09, 1941  DATE OF ADMISSION:  04/18/2016 ADMITTING PHYSICIAN: Richard Loll, MD  DATE OF DISCHARGE: 04/20/2016 10:30 AM  PRIMARY CARE PHYSICIAN: Richard Brunner, MD    ADMISSION DIAGNOSIS:  dvt of neck right IJ DVT  DISCHARGE DIAGNOSIS:  Principal Problem:   DVT (deep venous thrombosis), right   SECONDARY DIAGNOSIS:   Past Medical History  Diagnosis Date  . Myocardial infarct ([place])   . COPD (chronic obstructive pulmonary disease) ([place])   . Hypertension   . Diabetes mellitus without complication ([place])   . Asthma   . 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 CML (chronic myelocytic leukemia) ([place])   . Diabetes mellitus type II, controlled ([place])   . Small cell lung cancer ([place])   . 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  1. Right internal jugular vein DVT: Patient was started on heparin drip. That the surgery was consulted. Patient underwent thrombectomy. Patient is asymptomatic currently. She will be discharged on Eliquis instead of XARELTO which he was on prior to admission.  2. Small cell lung cancer: Patient is followed by oncology.   3. Diabetes: Continue outpatient regimen..  4. Tobacco dependence: Continue nicotine patch.patient counseled for 3 minutes regarding stopping smoking.    DISCHARGE CONDITIONS AND DIET:   Stable for discharge on diabetic diet  CONSULTS OBTAINED:  Treatment Team:  Richard Huxley, MD  DRUG ALLERGIES:   Allergies  Allergen Reactions  . Clarithromycin Nausea And Vomiting  . Penicillins Nausea And Vomiting, Rash and Other (See Comments)    Has patient had a PCN reaction causing immediate rash, facial/tongue/throat swelling,  SOB or lightheadedness with hypotension: No Has patient had a PCN reaction causing severe rash involving mucus membranes or skin necrosis: No Has patient had a PCN reaction that required hospitalization No Has patient had a PCN reaction occurring within the last 10 years: No If all of the above answers are "NO", then may proceed with Cephalosporin use.    DISCHARGE MEDICATIONS:   Discharge Medication List as of 04/20/2016 10:07 AM    START taking these medications   Details  apixaban (ELIQUIS) 5 MG TABS tablet Take 1 tablet (5 mg total) by mouth 2 (two) times daily. 10 mg twice a day for 7 days then 5 mg twice a day for DVT, Starting 04/20/2016, Until Discontinued, Normal    nicotine (NICODERM CQ - DOSED IN MG/24 HOURS) 14 mg/24hr patch Place 1 patch (14 mg total) onto the skin daily., Starting 04/20/2016, Until Discontinued, Normal      CONTINUE these medications which have NOT CHANGED   Details  albuterol (PROAIR HFA) 108 (90 Base) MCG/ACT inhaler Inhale 2 puffs into the lungs every 6 (six) hours as needed for wheezing or shortness of breath. , Until Discontinued, Historical Med    albuterol (PROVENTIL) (2.5 MG/3ML) 0.083% nebulizer solution Inhale 3 mLs into the lungs every 6 (six) hours as needed for wheezing or shortness of breath. , Until Discontinued, Historical Med    ALPRAZolam (XANAX) 0.5 MG tablet Take by mouth., Starting 04/17/2016, Until Sat 06/16/16, Historical Med    lidocaine-prilocaine (EMLA) cream Apply 1 application topically as needed (prior to  accessing port)., Until Discontinued, Historical Med    magic mouthwash SOLN Take 5 mLs by mouth 4 (four) times daily as needed for mouth pain., Starting 04/04/2016, Until Discontinued, Print    metFORMIN (GLUCOPHAGE) 1000 MG tablet Take 1,000 mg by mouth 2 (two) times daily with a meal. , Until Discontinued, Historical Med    ondansetron (ZOFRAN) 8 MG tablet Take 8 mg by mouth every 8 (eight) hours as needed for nausea or  vomiting., Until Discontinued, Historical Med    prochlorperazine (COMPAZINE) 10 MG tablet Take 1 tablet (10 mg total) by mouth every 6 (six) hours as needed for nausea or vomiting., Starting 02/07/2016, Until Discontinued, Normal    traMADol (ULTRAM) 50 MG tablet Take 50 mg by mouth every 6 (six) hours as needed for moderate pain. , Until Discontinued, Historical Med      STOP taking these medications     enoxaparin (LOVENOX) 150 MG/ML injection      Rivaroxaban 15 & 20 MG TBPK               Today   CHIEF COMPLAINT:   Patient doing well this morning. No headache or nausea. No dizziness. No chest pain.   VITAL SIGNS:  Blood pressure 143/61, pulse 73, temperature 98.4 F (36.9 C), temperature source Oral, resp. rate 18, height '5\' 9"'$  (1.753 m), weight 83.371 kg (183 lb 12.8 oz), SpO2 98 %.   REVIEW OF SYSTEMS:  Review of Systems  Constitutional: Negative for fever, chills and malaise/fatigue.  HENT: Negative for ear discharge, ear pain, hearing loss, nosebleeds and sore throat.   Eyes: Negative for blurred vision and pain.  Respiratory: Negative for cough, hemoptysis, shortness of breath and wheezing.   Cardiovascular: Negative for chest pain, palpitations and leg swelling.  Gastrointestinal: Negative for nausea, vomiting, abdominal pain, diarrhea and blood in stool.  Genitourinary: Negative for dysuria.  Musculoskeletal: Negative for back pain.  Neurological: Negative for dizziness, tremors, speech change, focal weakness, seizures and headaches.  Endo/Heme/Allergies: Does not bruise/bleed easily.  Psychiatric/Behavioral: Negative for depression, suicidal ideas and hallucinations.     PHYSICAL EXAMINATION:  GENERAL:  75 y.o.-year-old patient lying in the bed with no acute distress.  NECK:  Supple, no jugular venous distention. No thyroid enlargement, no tenderness.  LUNGS: Normal breath sounds bilaterally, no wheezing, rales,rhonchi  No use of accessory muscles of  respiration.  CARDIOVASCULAR: S1, S2 normal. No murmurs, rubs, or gallops.  ABDOMEN: Soft, non-tender, non-distended. Bowel sounds present. No organomegaly or mass.  EXTREMITIES: No pedal edema, cyanosis, or clubbing.  PSYCHIATRIC: The patient is alert and oriented x 3.  SKIN: No obvious rash, lesion, or ulcer.   DATA REVIEW:   CBC  Recent Labs Lab 04/20/16 0502  WBC 6.7  HGB 12.4*  HCT 35.8*  PLT 244    Chemistries   Recent Labs Lab 04/18/16 0835 04/20/16 0502  NA 134* 134*  K 4.3 4.0  CL 96* 102  CO2 30 25  GLUCOSE 146* 136*  BUN 13 13  CREATININE 0.83 0.78  CALCIUM 9.6 8.3*  AST 22  --   ALT 18  --   ALKPHOS 53  --   BILITOT 0.6  --     Cardiac Enzymes No results for input(s): TROPONINI in the last 168 hours.  Microbiology Results  '@[id number]'$ @  RADIOLOGY:  No results found.    Management plans discussed with the patient and he is in agreement. Stable for discharge home  Patient should follow up with pcp  in 1 week  CODE STATUS:     Code Status Orders        Start     Ordered   04/18/16 1736  Limited resuscitation (code)   Continuous    Question Answer Comment  In the event of cardiac or respiratory ARREST: Initiate Code Blue, Call Rapid Response Yes   In the event of cardiac or respiratory ARREST: Perform CPR Yes   In the event of cardiac or respiratory ARREST: Perform Intubation/Mechanical Ventilation No   In the event of cardiac or respiratory ARREST: Use NIPPV/BiPAp only if indicated Yes   In the event of cardiac or respiratory ARREST: Administer ACLS medications if indicated Yes   In the event of cardiac or respiratory ARREST: Perform Defibrillation or Cardioversion if indicated Yes      04/18/16 1735    Code Status History    Date Active Date Inactive Code Status Order ID Comments User Context   04/18/2016  5:23 PM 04/18/2016  5:35 PM Full Code [id number]  Richard Loll, MD Inpatient   04/06/2016  9:33 PM 04/09/2016  5:34 PM Full Code  [id number]  Theodoro Grist, MD Inpatient      TOTAL TIME TAKING CARE OF THIS PATIENT: 35 minutes.    Note: This dictation was prepared with Dragon dictation along with smaller phrase technology. Any transcriptional errors that result from this process are unintentional.  Durward Matranga M.D on 04/20/2016 at 11:33 AM  Between 7am to 6pm - Pager - [phone] After 6pm go to [email or web address] - password [email or web address] [facility]  Office  [phone]  CC: Primary care physician; Richard Brunner, MD

## 2016-04-20 NOTE — Progress Notes (Signed)
Patient discharged home per MD orders. All discharge instructions given and all questions answered. Patient escorted out by volunteer and wife.

## 2016-04-25 ENCOUNTER — Inpatient Hospital Stay (HOSPITAL_BASED_OUTPATIENT_CLINIC_OR_DEPARTMENT_OTHER): Payer: Medicare Other | Admitting: Internal Medicine

## 2016-04-25 VITALS — BP 133/78 | HR 87 | Temp 98.0°F | Resp 18 | Wt 186.0 lb

## 2016-04-25 DIAGNOSIS — Z923 Personal history of irradiation: Secondary | ICD-10-CM

## 2016-04-25 DIAGNOSIS — Z7901 Long term (current) use of anticoagulants: Secondary | ICD-10-CM | POA: Diagnosis not present

## 2016-04-25 DIAGNOSIS — Z79899 Other long term (current) drug therapy: Secondary | ICD-10-CM | POA: Diagnosis not present

## 2016-04-25 DIAGNOSIS — I82C11 Acute embolism and thrombosis of right internal jugular vein: Secondary | ICD-10-CM | POA: Diagnosis not present

## 2016-04-25 DIAGNOSIS — R079 Chest pain, unspecified: Secondary | ICD-10-CM | POA: Diagnosis not present

## 2016-04-25 DIAGNOSIS — E119 Type 2 diabetes mellitus without complications: Secondary | ICD-10-CM

## 2016-04-25 DIAGNOSIS — I252 Old myocardial infarction: Secondary | ICD-10-CM

## 2016-04-25 DIAGNOSIS — Z800 Family history of malignant neoplasm of digestive organs: Secondary | ICD-10-CM | POA: Diagnosis not present

## 2016-04-25 DIAGNOSIS — I10 Essential (primary) hypertension: Secondary | ICD-10-CM | POA: Diagnosis not present

## 2016-04-25 DIAGNOSIS — F17210 Nicotine dependence, cigarettes, uncomplicated: Secondary | ICD-10-CM | POA: Diagnosis not present

## 2016-04-25 DIAGNOSIS — Z7984 Long term (current) use of oral hypoglycemic drugs: Secondary | ICD-10-CM | POA: Diagnosis not present

## 2016-04-25 DIAGNOSIS — J45909 Unspecified asthma, uncomplicated: Secondary | ICD-10-CM | POA: Diagnosis not present

## 2016-04-25 DIAGNOSIS — R0602 Shortness of breath: Secondary | ICD-10-CM | POA: Diagnosis not present

## 2016-04-25 DIAGNOSIS — J449 Chronic obstructive pulmonary disease, unspecified: Secondary | ICD-10-CM

## 2016-04-25 DIAGNOSIS — Z86718 Personal history of other venous thrombosis and embolism: Secondary | ICD-10-CM

## 2016-04-25 DIAGNOSIS — R51 Headache: Secondary | ICD-10-CM | POA: Diagnosis not present

## 2016-04-25 DIAGNOSIS — I2510 Atherosclerotic heart disease of native coronary artery without angina pectoris: Secondary | ICD-10-CM

## 2016-04-25 DIAGNOSIS — Z9221 Personal history of antineoplastic chemotherapy: Secondary | ICD-10-CM

## 2016-04-25 DIAGNOSIS — C3412 Malignant neoplasm of upper lobe, left bronchus or lung: Secondary | ICD-10-CM | POA: Diagnosis not present

## 2016-04-25 DIAGNOSIS — I82409 Acute embolism and thrombosis of unspecified deep veins of unspecified lower extremity: Secondary | ICD-10-CM | POA: Insufficient documentation

## 2016-04-25 DIAGNOSIS — C9110 Chronic lymphocytic leukemia of B-cell type not having achieved remission: Secondary | ICD-10-CM | POA: Diagnosis not present

## 2016-04-25 DIAGNOSIS — R599 Enlarged lymph nodes, unspecified: Secondary | ICD-10-CM

## 2016-04-25 NOTE — Assessment & Plan Note (Addendum)
#   Small cell lung cancer- Extensive stage small cell lung cancer-Status post cycle #2 of carbo etoposide approximately 5 weeks ago. Also on palliative radiation left chest wall.;June 2017-  CT scan shows significant improvement of the left upper lobe lung mass;  # Given the interruption of chemotherapy secondary to DVT/hospitalization- I would recommend restarting chemotherapy cycle #3 next week.   #  Neck pain/.head aches-improved. S/p thrombolysis; on Lovenox. Again reminded the importance of Lovenox injection  # Patient follow-up with me in approximately 10 days post chemotherapy. I will plan of care was discussed the patient and family.

## 2016-04-25 NOTE — Progress Notes (Signed)
Patient states he used his last lovenox this morning.  Needs to know if he needs to continue.  Also states he had 80% of blood clot removed.  Still is having some headache on the right side 3/10.

## 2016-04-25 NOTE — Progress Notes (Signed)
[place] OFFICE PROGRESS NOTE  Patient Care Team: Madelyn Brunner, MD as PCP - General (Internal Medicine)   SUMMARY OF ONCOLOGIC HISTORY:  Oncology History   # April-MAY 2017 SMALL CELL LUNG CANCER EXTENSIVE STAGE  LUL #1 4.1x 3.5cm;#2- 3.3x3.0 #3 Left pleural based nodule 35m [new]; Left apical radiation changes. 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  3 weeks ago he was admitted to the hospital for right IJ DVT extensive on IV heparin; symptomatic improvement. Discharged home on xarelto- secondary to insurance issues-no Lovenox. However at last visit approximately week ago patient was recently admitted to the hospital for worsening headaches/ noted to have extensive DVT of the right IJ; which needed thrombolysis. Also patient currently on Lovenox 80 mg twice a day.  Patient's headaches continued improvement. Neck pain pain resolved. No bleeding issues. No nausea no vomiting. Poor appetite. Patient not taking any pain medication for this left chest wall pain/resolved.  Patient has chronic mild shortness of breath or chronic cough. No hoarseness of voice.  REVIEW OF SYSTEMS:  A  complete 10 point review of system is done which is negative except mentioned above/history of present illness.   PAST MEDICAL HISTORY :  Past Medical History  Diagnosis Date  . Myocardial infarct ([facility] [place] [facility]   . COPD (chronic obstructive pulmonary disease) ([place]   . Hypertension   . Diabetes mellitus without complication (HD[place]   . Asthma   . Lung cancer, upper lobe ([place] 2009  . CML (chronic myelocytic leukemia) ([facility]   . Diabetes mellitus type II, controlled (HD[place]   . Small cell lung cancer (HS[place]   . 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 Throat surgery    . Tumor removal Right     "years ago-right lower posterior side"  . Peripheral vascular catheterization N/A 02/21/2016    Procedure: PGlori LuisCath Insertion;  Surgeon: JAlgernon Huxley MD;  [facility];  Service: Cardiovascular;  Laterality: N/A;  . Port-a-cath removal N/A 04/07/2016    Procedure: REMOVAL PORT-A-CATH;  Surgeon: VSerafina Mitchell MD;  Location: [facility];  Service: Vascular;  Laterality: N/A;  . Peripheral vascular catheterization Right 04/19/2016    Procedure: Thrombectomy/thrombolysis right IJ DVT;  Surgeon: JAlgernon Huxley MD;  [facility];  Service: Cardiovascular;  Laterality: Right;    FAMILY HISTORY :   Family History  Problem Relation Age of Onset  . Cancer - Colon Father   . Cancer - Colon Sister     SOCIAL HISTORY:   Social History  Substance Use Topics  .  Smoking status: Current Every Day Smoker -- 1.00 packs/day for 62 years    Types: Cigarettes  . Smokeless tobacco: Not on file  . Alcohol Use: 3.6 oz/week    6 Cans of beer per week     Comment: rarely    ALLERGIES:  is allergic to clarithromycin and penicillins.  MEDICATIONS:  Current Outpatient Prescriptions  Medication Sig Dispense Refill  . albuterol (PROAIR HFA) 108 (90 Base) MCG/ACT inhaler Inhale 2 puffs into the lungs every 6  (six) hours as needed for wheezing or shortness of breath.     Marland Kitchen albuterol (PROVENTIL) (2.5 MG/3ML) 0.083% nebulizer solution Inhale 3 mLs into the lungs every 6 (six) hours as needed for wheezing or shortness of breath.     . ALPRAZolam (XANAX) 0.5 MG tablet Take by mouth.    . enoxaparin (LOVENOX) 80 MG/0.8ML injection Inject 0.8 mLs (80 mg total) into the skin every 12 (twelve) hours. 60 Syringe 3  . lidocaine-prilocaine (EMLA) cream Apply 1 application topically as needed (prior to accessing port).    . magic mouthwash SOLN Take 5 mLs by mouth 4 (four) times daily as needed for mouth pain. 200 mL 3  . metFORMIN (GLUCOPHAGE) 1000 MG tablet Take 1,000 mg by mouth 2 (two) times daily with a meal.     . nicotine (NICODERM CQ - DOSED IN MG/24 HOURS) 14 mg/24hr patch Place 1 patch (14 mg total) onto the skin daily. 28 patch 0  . ondansetron (ZOFRAN) 8 MG tablet Take 8 mg by mouth every 8 (eight) hours as needed for nausea or vomiting.    . prochlorperazine (COMPAZINE) 10 MG tablet Take 1 tablet (10 mg total) by mouth every 6 (six) hours as needed for nausea or vomiting. 30 tablet 0  . apixaban (ELIQUIS) 5 MG TABS tablet Take 1 tablet (5 mg total) by mouth 2 (two) times daily. 10 mg twice a day for 7 days then 5 mg twice a day for DVT (Patient not taking: Reported on 04/25/2016) 60 tablet 0  . traMADol (ULTRAM) 50 MG tablet Take 50 mg by mouth every 6 (six) hours as needed for moderate pain.   3   No current facility-administered medications for this visit.    PHYSICAL EXAMINATION: ECOG PERFORMANCE STATUS: 1 - Symptomatic but completely ambulatory  BP 133/78 mmHg  Pulse 87  Temp(Src) 98 F (36.7 C) (Tympanic)  Resp 18  Wt 186 lb (84.369 kg)  Filed Weights   04/25/16 1507  Weight: 186 lb (84.369 kg)    GENERAL: Well-nourished well-developed; Alert, no distress and comfortable.   Accompanied by wife /1daughter  EYES: no pallor or icterus OROPHARYNX: no thrush or ulceration; poor dentition;  Bil ear wax  NECK: vague swelling under the neck; tender; no skin changes.  LYMPH:  no palpable lymphadenopathy in the cervical, axillary or inguinal regions LUNGS: Decreased air entry bilaterally.  No wheeze or crackles HEART/CVS: regular rate & rhythm and no murmurs; No lower extremity edema ABDOMEN:abdomen soft, non-tender and normal bowel sounds Musculoskeletal:no cyanosis of digits and no clubbing; NO tenderness noted in the left chest wall. No lumps or bumps noted. PSYCH: alert & oriented x 3 with fluent speech NEURO: no focal motor/sensory deficits SKIN:  no rashes or significant lesions  LABORATORY DATA:  I have reviewed the data as listed    Component Value Date/Time   NA 134* 04/20/2016 0502   K 4.0 04/20/2016 0502   CL 102 04/20/2016 0502   CO2 25 04/20/2016 0502  GLUCOSE 136* 04/20/2016 0502   BUN 13 04/20/2016 0502   CREATININE 0.78 04/20/2016 0502   CREATININE 0.99 02/26/2014 1033   CALCIUM 8.3* 04/20/2016 0502   CALCIUM 8.2* 02/26/2014 1033   PROT 8.8* 04/18/2016 0835   PROT 7.3 02/26/2014 1033   ALBUMIN 4.0 04/18/2016 0835   ALBUMIN 3.4 02/26/2014 1033   AST 22 04/18/2016 0835   AST 17 02/26/2014 1033   ALT 18 04/18/2016 0835   ALT 25 02/26/2014 1033   ALKPHOS 53 04/18/2016 0835   ALKPHOS 54 02/26/2014 1033   BILITOT 0.6 04/18/2016 0835   BILITOT 0.4 02/26/2014 1033   GFRNONAA >60 04/20/2016 0502   GFRNONAA >60 02/26/2014 1033   GFRAA >60 04/20/2016 0502   GFRAA >60 02/26/2014 1033    No results found for: SPEP, UPEP  Lab Results  Component Value Date   WBC 6.7 04/20/2016   NEUTROABS 6.1 04/18/2016   HGB 12.4* 04/20/2016   HCT 35.8* 04/20/2016   MCV 83.3 04/20/2016   PLT 244 04/20/2016      Chemistry      Component Value Date/Time   NA 134* 04/20/2016 0502   K 4.0 04/20/2016 0502   CL 102 04/20/2016 0502   CO2 25 04/20/2016 0502   BUN 13 04/20/2016 0502   CREATININE 0.78 04/20/2016 0502   CREATININE 0.99 02/26/2014 1033      Component  Value Date/Time   CALCIUM 8.3* 04/20/2016 0502   CALCIUM 8.2* 02/26/2014 1033   ALKPHOS 53 04/18/2016 0835   ALKPHOS 54 02/26/2014 1033   AST 22 04/18/2016 0835   AST 17 02/26/2014 1033   ALT 18 04/18/2016 0835   ALT 25 02/26/2014 1033   BILITOT 0.6 04/18/2016 0835   BILITOT 0.4 02/26/2014 1033      ASSESSMENT & PLAN:   Primary malignant neoplasm of left upper lobe of lung ([place]) # Small cell lung cancer- Extensive stage small cell lung cancer-Status post cycle #2 of carbo etoposide approximately 5 weeks ago. Also on palliative radiation left chest wall.;June 2017-  CT scan shows significant improvement of the left upper lobe lung mass;  # Given the interruption of chemotherapy secondary to DVT/hospitalization- I would recommend restarting chemotherapy cycle #3 next week.   #  Neck pain/.head aches-improved. S/p thrombolysis; on Lovenox. Again reminded the importance of Lovenox injection  # Patient follow-up with me in approximately 10 days post chemotherapy. I will plan of care was discussed the patient and family.    # 25 minutes face-to-face with the patient discussing the above plan of care; more than 50% of time spent on prognosis/ natural history; counseling and coordination.    Cammie Sickle, MD 04/25/2016 9:43 PM

## 2016-04-26 ENCOUNTER — Ambulatory Visit: Payer: Medicare Other | Admitting: Radiation Oncology

## 2016-04-27 LAB — GLUCOSE, CAPILLARY
GLUCOSE-CAPILLARY: 136 mg/dL — AB (ref 65–99)
GLUCOSE-CAPILLARY: 123 mg/dL — AB (ref 65–99)

## 2016-05-01 ENCOUNTER — Other Ambulatory Visit: Payer: Self-pay | Admitting: Internal Medicine

## 2016-05-02 ENCOUNTER — Inpatient Hospital Stay: Payer: Medicare Other

## 2016-05-02 ENCOUNTER — Ambulatory Visit
Admission: RE | Admit: 2016-05-02 | Discharge: 2016-05-02 | Disposition: A | Discharge status: Home / Self Care | Payer: Medicare Other | Source: Ambulatory Visit | Attending: Internal Medicine | Admitting: Internal Medicine

## 2016-05-02 ENCOUNTER — Ambulatory Visit: Payer: Medicare Other | Admitting: Radiation Oncology

## 2016-05-02 ENCOUNTER — Inpatient Hospital Stay (HOSPITAL_BASED_OUTPATIENT_CLINIC_OR_DEPARTMENT_OTHER): Payer: Medicare Other | Admitting: Internal Medicine

## 2016-05-02 VITALS — BP 143/73 | HR 85 | Temp 97.9°F | Resp 18 | Wt 184.4 lb

## 2016-05-02 DIAGNOSIS — Z923 Personal history of irradiation: Secondary | ICD-10-CM

## 2016-05-02 DIAGNOSIS — J449 Chronic obstructive pulmonary disease, unspecified: Secondary | ICD-10-CM

## 2016-05-02 DIAGNOSIS — C3412 Malignant neoplasm of upper lobe, left bronchus or lung: Secondary | ICD-10-CM

## 2016-05-02 DIAGNOSIS — Z7984 Long term (current) use of oral hypoglycemic drugs: Secondary | ICD-10-CM

## 2016-05-02 DIAGNOSIS — I10 Essential (primary) hypertension: Secondary | ICD-10-CM

## 2016-05-02 DIAGNOSIS — R079 Chest pain, unspecified: Secondary | ICD-10-CM | POA: Diagnosis not present

## 2016-05-02 DIAGNOSIS — R519 Headache, unspecified: Secondary | ICD-10-CM

## 2016-05-02 DIAGNOSIS — Z7901 Long term (current) use of anticoagulants: Secondary | ICD-10-CM

## 2016-05-02 DIAGNOSIS — R51 Headache: Secondary | ICD-10-CM | POA: Insufficient documentation

## 2016-05-02 DIAGNOSIS — Z8673 Personal history of transient ischemic attack (TIA), and cerebral infarction without residual deficits: Secondary | ICD-10-CM | POA: Diagnosis not present

## 2016-05-02 DIAGNOSIS — I2510 Atherosclerotic heart disease of native coronary artery without angina pectoris: Secondary | ICD-10-CM

## 2016-05-02 DIAGNOSIS — Z800 Family history of malignant neoplasm of digestive organs: Secondary | ICD-10-CM

## 2016-05-02 DIAGNOSIS — Z79899 Other long term (current) drug therapy: Secondary | ICD-10-CM

## 2016-05-02 DIAGNOSIS — E119 Type 2 diabetes mellitus without complications: Secondary | ICD-10-CM

## 2016-05-02 DIAGNOSIS — C3492 Malignant neoplasm of unspecified part of left bronchus or lung: Secondary | ICD-10-CM

## 2016-05-02 DIAGNOSIS — Z9221 Personal history of antineoplastic chemotherapy: Secondary | ICD-10-CM

## 2016-05-02 DIAGNOSIS — R0602 Shortness of breath: Secondary | ICD-10-CM

## 2016-05-02 DIAGNOSIS — I82C11 Acute embolism and thrombosis of right internal jugular vein: Secondary | ICD-10-CM | POA: Diagnosis not present

## 2016-05-02 DIAGNOSIS — R599 Enlarged lymph nodes, unspecified: Secondary | ICD-10-CM

## 2016-05-02 DIAGNOSIS — J45909 Unspecified asthma, uncomplicated: Secondary | ICD-10-CM

## 2016-05-02 DIAGNOSIS — Z86718 Personal history of other venous thrombosis and embolism: Secondary | ICD-10-CM

## 2016-05-02 DIAGNOSIS — F17210 Nicotine dependence, cigarettes, uncomplicated: Secondary | ICD-10-CM

## 2016-05-02 DIAGNOSIS — I252 Old myocardial infarction: Secondary | ICD-10-CM

## 2016-05-02 DIAGNOSIS — C9110 Chronic lymphocytic leukemia of B-cell type not having achieved remission: Secondary | ICD-10-CM

## 2016-05-02 LAB — CBC WITH DIFFERENTIAL/PLATELET
Basophils Absolute: 0 10*3/uL (ref 0–0.1)
Basophils Relative: 1 %
EOS ABS: 0.1 10*3/uL (ref 0–0.7)
EOS PCT: 3 %
HCT: 37.3 % — ABNORMAL LOW (ref 40.0–52.0)
HEMOGLOBIN: 12.8 g/dL — AB (ref 13.0–18.0)
LYMPHS ABS: 1.1 10*3/uL (ref 1.0–3.6)
Lymphocytes Relative: 22 %
MCH: 28.9 pg (ref 26.0–34.0)
MCHC: 34.3 g/dL (ref 32.0–36.0)
MCV: 84 fL (ref 80.0–100.0)
MONOS PCT: 8 %
Monocytes Absolute: 0.4 10*3/uL (ref 0.2–1.0)
NEUTROS PCT: 66 %
Neutro Abs: 3.2 10*3/uL (ref 1.4–6.5)
Platelets: 189 10*3/uL (ref 150–440)
RBC: 4.44 MIL/uL (ref 4.40–5.90)
RDW: 17.8 % — ABNORMAL HIGH (ref 11.5–14.5)
WBC: 4.8 10*3/uL (ref 3.8–10.6)

## 2016-05-02 LAB — COMPREHENSIVE METABOLIC PANEL
ALK PHOS: 45 U/L (ref 38–126)
ALT: 24 U/L (ref 17–63)
ANION GAP: 8 (ref 5–15)
AST: 24 U/L (ref 15–41)
Albumin: 3.9 g/dL (ref 3.5–5.0)
BUN: 14 mg/dL (ref 6–20)
CALCIUM: 8.9 mg/dL (ref 8.9–10.3)
CO2: 27 mmol/L (ref 22–32)
Chloride: 98 mmol/L — ABNORMAL LOW (ref 101–111)
Creatinine, Ser: 0.8 mg/dL (ref 0.61–1.24)
GFR calc non Af Amer: >60 mL/min (ref >60)
Glucose, Bld: 198 mg/dL — ABNORMAL HIGH (ref 65–99)
Potassium: 4 mmol/L (ref 3.5–5.1)
SODIUM: 133 mmol/L — AB (ref 135–145)
TOTAL PROTEIN: 7.7 g/dL (ref 6.5–8.1)
Total Bilirubin: 0.5 mg/dL (ref 0.3–1.2)

## 2016-05-02 MED ORDER — TRAMADOL HCL 50 MG PO TABS: 50.0000 mg | ORAL_TABLET | Freq: Four times a day (QID) | ORAL | 3 refills | Status: DC | PRN

## 2016-05-02 MED ORDER — IOPAMIDOL (ISOVUE-300) INJECTION 61%
75.0000 mL | Freq: Once | INTRAVENOUS | Status: AC | PRN
  Administered 2016-05-02: 75 mL via INTRAVENOUS

## 2016-05-02 NOTE — Progress Notes (Unsigned)
Pt brought back for treatment. C/O of headache, dizzy, nausea. Spoke with Renita Papa RN about symptoms. Dr Rogue Bussing to see patient.

## 2016-05-02 NOTE — Assessment & Plan Note (Addendum)
#   Small cell lung cancer- Extensive stage small cell lung cancer-Status post cycle #2 of carbo etoposide approximately 6 weeks ago. Also on palliative radiation left chest wall.;June 2017-  CT scan shows significant improvement of the left upper lobe lung mass;  # Headaches- not improving- get STAT CTA brain.   # HOLD CHEMO today,.   #  Neck pain- improved;  S/p thrombolysis; on Lovenox. Again reminded the importance of Lovenox injection. Discussed with Dr.Dew. 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  # Follow-up with me in approximately 10 days; proceed with chemotherapy.

## 2016-05-02 NOTE — Progress Notes (Signed)
Patient states he has a severe headache.  States he is nauseated and feels like he is going to throw up at any minute.  Also c/o dizziness and SOB.  Obtained orthostatic BP's - Standing 128/73 HR 81.  Lying 128/73 HR 76.

## 2016-05-02 NOTE — Progress Notes (Signed)
[place] OFFICE PROGRESS NOTE  Patient Care Team: Madelyn Brunner, MD as PCP - General (Internal Medicine)   SUMMARY OF ONCOLOGIC HISTORY:  Oncology History   # April-MAY 2017 SMALL CELL LUNG CANCER EXTENSIVE STAGE  LUL #1 4.1x 3.5cm;#2- 3.3x3.0 #3 Left pleural based nodule 11m [new]; Left apical radiation changes. 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] s/p Chemo-RT [decined Surgery]     Small cell lung cancer ([place]   02/06/2016 Initial Diagnosis    Small cell lung cancer (HN[place]      Primary malignant neoplasm of left upper lobe of lung ([place]   04/18/2016 Initial Diagnosis    Primary malignant neoplasm of left upper lobe of lung (HB[place]       INTERVAL HISTORY:  75year old male patient with extensive stage small cell lung cancer currently on first line therapy with carboplatin etoposide chemotherapy- status post cycle #2 approximately 8 weeks ago.Also history of extensive DVT of the right IJ on Lovenox  Patient complains of continued headaches on the right side/ these have not been improving not resolved. Denies any neck pain. He continues to Lovenox twice a day. He feels nauseous; intermittent dizziness. No falls. Patient has not been taking any pain medication.   Neck pain pain resolved. No bleeding issues. No nausea no vomiting. Poor appetite. Patient not taking any pain medication for this left chest wall pain/resolved.  Patient has chronic mild shortness of breath or chronic cough. No hoarseness of voice.  REVIEW OF SYSTEMS:  A complete 10 point review of system is done which is negative except mentioned above/history of present illness.   PAST MEDICAL  HISTORY :  Past Medical History:  Diagnosis Date  . Asthma   . CML (chronic myelocytic leukemia) (HN[place]   . COPD (chronic obstructive pulmonary disease) (HVand[facility] AFB   . Diabetes mellitus type II, controlled (HS[place]   . Diabetes mellitus without complication (HR[place]   . Hypertension   . 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 Lung cancer, upper lobe (HD[place] 2009  . Myocardial infarct (H[place]   . Small cell lung cancer (HP[place]     PAST SURGICAL HISTORY :   Past Surgical History:  Procedure Laterality Date  . PERIPHERAL VASCULAR CATHETERIZATION N/A 02/21/2016   Procedure: PGlori LuisCath Insertion;  Surgeon: JAlgernon Huxley MD;  [facility];  Service: Cardiovascular;  Laterality: N/A;  . PERIPHERAL VASCULAR CATHETERIZATION Right 04/19/2016   Procedure: Thrombectomy/thrombolysis right IJ DVT;  Surgeon: JAlgernon Huxley MD;  [facility];  Service: Cardiovascular;  Laterality: Right;  . PORT-A-CATH REMOVAL N/A 04/07/2016   Procedure: REMOVAL PORT-A-CATH;  Surgeon: VSerafina Mitchell MD;  Location: [facility];  Service: Vascular;  Laterality: N/A;  . THROAT SURGERY    . TUMOR REMOVAL Right    "years ago-right lower posterior side"    FAMILY HISTORY :   Family History  Problem Relation Age of Onset  . Cancer - Colon Father   . Cancer - Colon Sister     SOCIAL HISTORY:   Social History  Substance Use Topics  . Smoking status: Current Every Day Smoker    Packs/day: 1.00  Years: 62.00    Types: Cigarettes  . Smokeless tobacco: Not on file  . Alcohol use 3.6 oz/week    6 Cans of beer per week     Comment: rarely    ALLERGIES:  is allergic to clarithromycin and penicillins.  MEDICATIONS:  Current Outpatient Prescriptions  Medication Sig Dispense Refill  . albuterol (PROAIR HFA) 108 (90 Base) MCG/ACT inhaler Inhale 2 puffs into the lungs every 6 (six) hours as needed for wheezing or shortness of breath.     Marland Kitchen albuterol (PROVENTIL) (2.5 MG/3ML) 0.083% nebulizer solution  Inhale 3 mLs into the lungs every 6 (six) hours as needed for wheezing or shortness of breath.     . ALPRAZolam (XANAX) 0.5 MG tablet Take by mouth.    . enoxaparin (LOVENOX) 80 MG/0.8ML injection Inject 0.8 mLs (80 mg total) into the skin every 12 (twelve) hours. 60 Syringe 3  . lidocaine-prilocaine (EMLA) cream Apply 1 application topically as needed (prior to accessing port).    . magic mouthwash SOLN Take 5 mLs by mouth 4 (four) times daily as needed for mouth pain. 200 mL 3  . metFORMIN (GLUCOPHAGE) 1000 MG tablet Take 1,000 mg by mouth 2 (two) times daily with a meal.     . nicotine (NICODERM CQ - DOSED IN MG/24 HOURS) 14 mg/24hr patch Place 1 patch (14 mg total) onto the skin daily. 28 patch 0  . ondansetron (ZOFRAN) 8 MG tablet Take 8 mg by mouth every 8 (eight) hours as needed for nausea or vomiting.    . prochlorperazine (COMPAZINE) 10 MG tablet Take 1 tablet (10 mg total) by mouth every 6 (six) hours as needed for nausea or vomiting. 30 tablet 0  . traMADol (ULTRAM) 50 MG tablet Take 1 tablet (50 mg total) by mouth every 6 (six) hours as needed for moderate pain. 30 tablet 3  . apixaban (ELIQUIS) 5 MG TABS tablet Take 1 tablet (5 mg total) by mouth 2 (two) times daily. 10 mg twice a day for 7 days then 5 mg twice a day for DVT 60 tablet 0   No current facility-administered medications for this visit.     PHYSICAL EXAMINATION: ECOG PERFORMANCE STATUS: 1 - Symptomatic but completely ambulatory  BP (!) 143/73 (BP Location: Left Arm, Patient Position: Sitting)   Pulse 85   Temp 97.9 F (36.6 C) (Tympanic)   Resp 18   Wt 184 lb 6 oz (83.6 kg)   BMI 27.23 kg/m   Filed Weights   05/02/16 1015  Weight: 184 lb 6 oz (83.6 kg)    GENERAL: Well-nourished well-developed; Alert, no distress and comfortable.   Accompanied by wife.  EYES: no pallor or icterus OROPHARYNX: no thrush or ulceration; poor dentition; Bil ear wax  NECK: vague swelling under the neck; tender; no skin changes.   LYMPH:  no palpable lymphadenopathy in the cervical, axillary or inguinal regions LUNGS: Decreased air entry bilaterally.  No wheeze or crackles HEART/CVS: regular rate & rhythm and no murmurs; No lower extremity edema ABDOMEN:abdomen soft, non-tender and normal bowel sounds Musculoskeletal:no cyanosis of digits and no clubbing; NO tenderness noted in the left chest wall. No lumps or bumps noted. PSYCH: alert & oriented x 3 with fluent speech NEURO: no focal motor/sensory deficits SKIN:  no rashes or significant lesions  LABORATORY DATA:  I have reviewed the data as listed    Component Value Date/Time   NA 133 (L) 05/02/2016 0912   K 4.0 05/02/2016 0912   CL  98 (L) 05/02/2016 0912   CO2 27 05/02/2016 0912   GLUCOSE 198 (H) 05/02/2016 0912   BUN 14 05/02/2016 0912   CREATININE 0.80 05/02/2016 0912   CREATININE 0.99 02/26/2014 1033   CALCIUM 8.9 05/02/2016 0912   CALCIUM 8.2 (L) 02/26/2014 1033   PROT 7.7 05/02/2016 0912   PROT 7.3 02/26/2014 1033   ALBUMIN 3.9 05/02/2016 0912   ALBUMIN 3.4 02/26/2014 1033   AST 24 05/02/2016 0912   AST 17 02/26/2014 1033   ALT 24 05/02/2016 0912   ALT 25 02/26/2014 1033   ALKPHOS 45 05/02/2016 0912   ALKPHOS 54 02/26/2014 1033   BILITOT 0.5 05/02/2016 0912   BILITOT 0.4 02/26/2014 1033   GFRNONAA >60 05/02/2016 0912   GFRNONAA >60 02/26/2014 1033   GFRAA >60 05/02/2016 0912   GFRAA >60 02/26/2014 1033    No results found for: SPEP, UPEP  Lab Results  Component Value Date   WBC 4.8 05/02/2016   NEUTROABS 3.2 05/02/2016   HGB 12.8 (L) 05/02/2016   HCT 37.3 (L) 05/02/2016   MCV 84.0 05/02/2016   PLT 189 05/02/2016      Chemistry      Component Value Date/Time   NA 133 (L) 05/02/2016 0912   K 4.0 05/02/2016 0912   CL 98 (L) 05/02/2016 0912   CO2 27 05/02/2016 0912   BUN 14 05/02/2016 0912   CREATININE 0.80 05/02/2016 0912   CREATININE 0.99 02/26/2014 1033      Component Value Date/Time   CALCIUM 8.9 05/02/2016 0912    CALCIUM 8.2 (L) 02/26/2014 1033   ALKPHOS 45 05/02/2016 0912   ALKPHOS 54 02/26/2014 1033   AST 24 05/02/2016 0912   AST 17 02/26/2014 1033   ALT 24 05/02/2016 0912   ALT 25 02/26/2014 1033   BILITOT 0.5 05/02/2016 0912   BILITOT 0.4 02/26/2014 1033      ASSESSMENT & PLAN:   Primary malignant neoplasm of left upper lobe of lung ([place]) # Small cell lung cancer- Extensive stage small cell lung cancer-Status post cycle #2 of carbo etoposide approximately 6 weeks ago. Also on palliative radiation left chest wall.;June 2017-  CT scan shows significant improvement of the left upper lobe lung mass;  # Headaches- not improving- get STAT CTA brain.   # HOLD CHEMO today,.   #  Neck pain- improved;  S/p thrombolysis; on Lovenox. Again reminded the importance of Lovenox injection. Discussed with Dr.Dew. 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  # Follow-up with me in approximately 10 days; proceed with chemotherapy.  # 25 minutes face-to-face with the patient discussing the above plan of care; more than 50% of time spent on prognosis/ natural history; counseling and coordination.Also recommend tramadol.    Cammie Sickle, MD 05/02/2016 5:24 PM

## 2016-05-03 ENCOUNTER — Ambulatory Visit: Payer: Medicare Other | Attending: Radiation Oncology | Admitting: Radiation Oncology

## 2016-05-03 ENCOUNTER — Inpatient Hospital Stay: Payer: Medicare Other

## 2016-05-03 ENCOUNTER — Telehealth: Payer: Self-pay | Admitting: *Deleted

## 2016-05-03 NOTE — Telephone Encounter (Signed)
-----   Message from Cammie Sickle, MD sent at 05/03/2016  8:37 AM EDT ----- Please inform patient that CT brain did not show any new blood clots/bleeding or evidence of cancer; so for now continue Tramadol for headache- follow-up as planned next week.

## 2016-05-03 NOTE — Telephone Encounter (Signed)
Contacted patient. 05/03/16. Pt answered the phone and stated "I'm just too busy to talk right now. Who ever this is, you need to contact me later." pt hung up phone.  I contacted the patient's wife and left vm wife's cell. I explained to pt's wife that pt's ct did not show any evidence of new blood clots/bleeding or signs of cancer.  I asked her to have pt continue taking the oral tramadol. I also asked wife to contact me back to let the cancer center know that this msg was received.

## 2016-05-04 ENCOUNTER — Inpatient Hospital Stay: Payer: Medicare Other

## 2016-05-04 NOTE — Telephone Encounter (Signed)
Called and spoke with patient, gave him results below and he was ok with this and verbalized understanding.

## 2016-05-11 ENCOUNTER — Other Ambulatory Visit: Payer: Self-pay | Admitting: *Deleted

## 2016-05-11 DIAGNOSIS — Z85118 Personal history of other malignant neoplasm of bronchus and lung: Secondary | ICD-10-CM

## 2016-05-14 ENCOUNTER — Inpatient Hospital Stay: Payer: Medicare Other | Attending: Internal Medicine | Admitting: Internal Medicine

## 2016-05-14 ENCOUNTER — Inpatient Hospital Stay: Payer: Medicare Other

## 2016-05-14 ENCOUNTER — Ambulatory Visit: Payer: Medicare Other | Admitting: Internal Medicine

## 2016-05-14 ENCOUNTER — Other Ambulatory Visit: Payer: Medicare Other

## 2016-05-14 VITALS — BP 120/67 | HR 77 | Temp 97.8°F | Resp 18 | Wt 185.1 lb

## 2016-05-14 DIAGNOSIS — Z7984 Long term (current) use of oral hypoglycemic drugs: Secondary | ICD-10-CM | POA: Insufficient documentation

## 2016-05-14 DIAGNOSIS — J449 Chronic obstructive pulmonary disease, unspecified: Secondary | ICD-10-CM | POA: Insufficient documentation

## 2016-05-14 DIAGNOSIS — Z923 Personal history of irradiation: Secondary | ICD-10-CM | POA: Insufficient documentation

## 2016-05-14 DIAGNOSIS — R05 Cough: Secondary | ICD-10-CM | POA: Diagnosis not present

## 2016-05-14 DIAGNOSIS — C3412 Malignant neoplasm of upper lobe, left bronchus or lung: Secondary | ICD-10-CM

## 2016-05-14 DIAGNOSIS — Z7689 Persons encountering health services in other specified circumstances: Secondary | ICD-10-CM | POA: Insufficient documentation

## 2016-05-14 DIAGNOSIS — I252 Old myocardial infarction: Secondary | ICD-10-CM | POA: Insufficient documentation

## 2016-05-14 DIAGNOSIS — J45909 Unspecified asthma, uncomplicated: Secondary | ICD-10-CM | POA: Insufficient documentation

## 2016-05-14 DIAGNOSIS — Z800 Family history of malignant neoplasm of digestive organs: Secondary | ICD-10-CM | POA: Insufficient documentation

## 2016-05-14 DIAGNOSIS — Z5111 Encounter for antineoplastic chemotherapy: Secondary | ICD-10-CM | POA: Insufficient documentation

## 2016-05-14 DIAGNOSIS — R918 Other nonspecific abnormal finding of lung field: Secondary | ICD-10-CM | POA: Diagnosis not present

## 2016-05-14 DIAGNOSIS — Z7901 Long term (current) use of anticoagulants: Secondary | ICD-10-CM | POA: Insufficient documentation

## 2016-05-14 DIAGNOSIS — Z79899 Other long term (current) drug therapy: Secondary | ICD-10-CM | POA: Insufficient documentation

## 2016-05-14 DIAGNOSIS — Z86718 Personal history of other venous thrombosis and embolism: Secondary | ICD-10-CM | POA: Diagnosis not present

## 2016-05-14 DIAGNOSIS — C3492 Malignant neoplasm of unspecified part of left bronchus or lung: Secondary | ICD-10-CM

## 2016-05-14 DIAGNOSIS — F17210 Nicotine dependence, cigarettes, uncomplicated: Secondary | ICD-10-CM | POA: Insufficient documentation

## 2016-05-14 DIAGNOSIS — R0789 Other chest pain: Secondary | ICD-10-CM | POA: Insufficient documentation

## 2016-05-14 DIAGNOSIS — I10 Essential (primary) hypertension: Secondary | ICD-10-CM

## 2016-05-14 DIAGNOSIS — Z85118 Personal history of other malignant neoplasm of bronchus and lung: Secondary | ICD-10-CM

## 2016-05-14 DIAGNOSIS — E119 Type 2 diabetes mellitus without complications: Secondary | ICD-10-CM | POA: Insufficient documentation

## 2016-05-14 LAB — COMPREHENSIVE METABOLIC PANEL
ALT: 20 U/L (ref 17–63)
AST: 19 U/L (ref 15–41)
Albumin: 4.1 g/dL (ref 3.5–5.0)
Alkaline Phosphatase: 47 U/L (ref 38–126)
Anion gap: 6 (ref 5–15)
BUN: 10 mg/dL (ref 6–20)
CHLORIDE: 99 mmol/L — AB (ref 101–111)
CO2: 27 mmol/L (ref 22–32)
Calcium: 8.9 mg/dL (ref 8.9–10.3)
Creatinine, Ser: 0.78 mg/dL (ref 0.61–1.24)
GFR calc Af Amer: >60 mL/min (ref >60)
GFR calc non Af Amer: >60 mL/min (ref >60)
GLUCOSE: 128 mg/dL — AB (ref 65–99)
POTASSIUM: 4.1 mmol/L (ref 3.5–5.1)
Sodium: 132 mmol/L — ABNORMAL LOW (ref 135–145)
Total Bilirubin: 0.5 mg/dL (ref 0.3–1.2)
Total Protein: 7.8 g/dL (ref 6.5–8.1)

## 2016-05-14 LAB — CBC WITH DIFFERENTIAL/PLATELET
BASOS PCT: 1 %
Basophils Absolute: 0.1 10*3/uL (ref 0–0.1)
Eosinophils Absolute: 0.1 10*3/uL (ref 0–0.7)
Eosinophils Relative: 3 %
HEMATOCRIT: 39.8 % — AB (ref 40.0–52.0)
Hemoglobin: 13.5 g/dL (ref 13.0–18.0)
LYMPHS ABS: 1.5 10*3/uL (ref 1.0–3.6)
Lymphocytes Relative: 28 %
MCH: 28.9 pg (ref 26.0–34.0)
MCHC: 33.9 g/dL (ref 32.0–36.0)
MCV: 85.2 fL (ref 80.0–100.0)
MONO ABS: 0.5 10*3/uL (ref 0.2–1.0)
MONOS PCT: 9 %
NEUTROS ABS: 3.3 10*3/uL (ref 1.4–6.5)
Neutrophils Relative %: 59 %
Platelets: 216 10*3/uL (ref 150–440)
RBC: 4.67 MIL/uL (ref 4.40–5.90)
RDW: 17.2 % — AB (ref 11.5–14.5)
WBC: 5.5 10*3/uL (ref 3.8–10.6)

## 2016-05-14 MED ORDER — SODIUM CHLORIDE 0.9% FLUSH
10.0000 mL | INTRAVENOUS | Status: DC | PRN
  Filled 2016-05-14: qty 10

## 2016-05-14 MED ORDER — SODIUM CHLORIDE 0.9 % IV SOLN
100.0000 mg/m2 | Freq: Once | INTRAVENOUS | Status: AC
  Administered 2016-05-14: 210 mg via INTRAVENOUS
  Filled 2016-05-14: qty 10.5

## 2016-05-14 MED ORDER — PALONOSETRON HCL INJECTION 0.25 MG/5ML
0.2500 mg | Freq: Once | INTRAVENOUS | Status: AC
  Administered 2016-05-14: 0.25 mg via INTRAVENOUS
  Filled 2016-05-14: qty 5

## 2016-05-14 MED ORDER — SODIUM CHLORIDE 0.9 % IV SOLN
10.0000 mg | Freq: Once | INTRAVENOUS | Status: AC
  Administered 2016-05-14: 10 mg via INTRAVENOUS
  Filled 2016-05-14: qty 1

## 2016-05-14 MED ORDER — SODIUM CHLORIDE 0.9 % IV SOLN
Freq: Once | INTRAVENOUS | Status: AC
  Administered 2016-05-14: 11:00:00 via INTRAVENOUS
  Filled 2016-05-14: qty 1000

## 2016-05-14 MED ORDER — SODIUM CHLORIDE 0.9 % IV SOLN
527.5000 mg | Freq: Once | INTRAVENOUS | Status: AC
  Administered 2016-05-14: 530 mg via INTRAVENOUS
  Filled 2016-05-14: qty 53

## 2016-05-14 MED ORDER — HEPARIN SOD (PORK) LOCK FLUSH 100 UNIT/ML IV SOLN: 500.0000 [IU] | Freq: Once | INTRAVENOUS | Status: DC | PRN

## 2016-05-14 NOTE — Progress Notes (Signed)
[facility] OFFICE PROGRESS NOTE  Patient Care Team: Madelyn Brunner, MD as PCP - General (Internal Medicine)   SUMMARY OF ONCOLOGIC HISTORY:  Oncology History   # April-MAY 2017 SMALL CELL LUNG CANCER EXTENSIVE STAGE  LUL #1 4.1x 3.5cm;#2- 3.3x3.0 #3 Left pleural based nodule 39m [new]; Left apical radiation changes. 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 Also history of extensive DVT of the right IJ on .  Patient states his headaches are improved. Denies any neck pain. Continues to Lovenox. He is concerned about the financial concerns with Lovenox.  Patient not taking any pain medication for this left chest wall pain/resolved.  Complains of mild cough; no hemoptysis.  Patient has chronic mild shortness of breath. No fevers or chills. No hoarseness of voice.  REVIEW OF SYSTEMS:  A complete 10 point review of system is done which is negative except mentioned above/history of present illness.   PAST MEDICAL HISTORY :  Past Medical History:  Diagnosis Date  . Asthma   . CML (chronic myelocytic leukemia) ([facility]   . COPD (chronic obstructive pulmonary disease) ([facility]    . Diabetes mellitus type II, controlled ([facility]   . Diabetes mellitus without complication ([facility]   . Hypertension   . 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 Lung cancer, upper lobe ([facility] 2009  . Myocardial infarct ([facility]   . Small cell lung cancer ([facility]     PAST SURGICAL HISTORY :   Past Surgical History:  Procedure Laterality Date  . PERIPHERAL VASCULAR CATHETERIZATION N/A 02/21/2016   Procedure: PGlori LuisCath Insertion;  Surgeon: JAlgernon Huxley MD;  [facility];  Service: Cardiovascular;  Laterality: N/A;  . PERIPHERAL VASCULAR CATHETERIZATION Right 04/19/2016   Procedure: Thrombectomy/thrombolysis right IJ DVT;  Surgeon: JAlgernon Huxley MD;  [facility];  Service: Cardiovascular;  Laterality: Right;  . PORT-A-CATH REMOVAL N/A 04/07/2016   Procedure: REMOVAL PORT-A-CATH;  Surgeon: VSerafina Mitchell MD;  Location: [facility] ORS;  Service: Vascular;  Laterality: N/A;  . THROAT SURGERY    . TUMOR REMOVAL Right    "years ago-right lower posterior side"    FAMILY HISTORY :   Family History  Problem Relation Age of Onset  . Cancer - Colon Father   . Cancer - Colon Sister     SOCIAL HISTORY:   Social History  Substance Use Topics  . Smoking status: Current Every Day Smoker    Packs/day: 1.00    Years: 62.00    Types: Cigarettes  . Smokeless tobacco: Not on file  . Alcohol use 3.6 oz/week  6 Cans of beer per week     Comment: rarely    ALLERGIES:  is allergic to clarithromycin and penicillins.  MEDICATIONS:  Current Outpatient Prescriptions  Medication Sig Dispense Refill  . albuterol (PROAIR HFA) 108 (90 Base) MCG/ACT inhaler Inhale 2 puffs into the lungs every 6 (six) hours as needed for wheezing or shortness of breath.     Marland Kitchen albuterol (PROVENTIL) (2.5 MG/3ML) 0.083% nebulizer solution Inhale 3 mLs into the lungs every 6 (six) hours as needed for wheezing or shortness of breath.     . ALPRAZolam (XANAX) 0.5 MG tablet Take by mouth.    Marland Kitchen apixaban  (ELIQUIS) 5 MG TABS tablet Take 1 tablet (5 mg total) by mouth 2 (two) times daily. 10 mg twice a day for 7 days then 5 mg twice a day for DVT 60 tablet 0  . enoxaparin (LOVENOX) 80 MG/0.8ML injection Inject 0.8 mLs (80 mg total) into the skin every 12 (twelve) hours. 60 Syringe 3  . lidocaine-prilocaine (EMLA) cream Apply 1 application topically as needed (prior to accessing port).    . magic mouthwash SOLN Take 5 mLs by mouth 4 (four) times daily as needed for mouth pain. 200 mL 3  . metFORMIN (GLUCOPHAGE) 1000 MG tablet Take 1,000 mg by mouth 2 (two) times daily with a meal.     . nicotine (NICODERM CQ - DOSED IN MG/24 HOURS) 14 mg/24hr patch Place 1 patch (14 mg total) onto the skin daily. 28 patch 0  . ondansetron (ZOFRAN) 8 MG tablet Take 8 mg by mouth every 8 (eight) hours as needed for nausea or vomiting.    . prochlorperazine (COMPAZINE) 10 MG tablet Take 1 tablet (10 mg total) by mouth every 6 (six) hours as needed for nausea or vomiting. 30 tablet 0  . traMADol (ULTRAM) 50 MG tablet Take 1 tablet (50 mg total) by mouth every 6 (six) hours as needed for moderate pain. 30 tablet 3   No current facility-administered medications for this visit.    Facility-Administered Medications Ordered in Other Visits  Medication Dose Route Frequency Provider Last Rate Last Dose  . CARBOplatin (PARAPLATIN) 530 mg in sodium chloride 0.9 % 250 mL chemo infusion  530 mg Intravenous Once Cammie Sickle, MD   Stopped at 05/14/16 1329    PHYSICAL EXAMINATION: ECOG PERFORMANCE STATUS: 1 - Symptomatic but completely ambulatory  BP 120/67 (BP Location: Left Arm, Patient Position: Sitting)   Pulse 77   Temp 97.8 F (36.6 C) (Tympanic)   Resp 18   Wt 185 lb 2 oz (84 kg)   BMI 27.34 kg/m   Filed Weights   05/14/16 1006  Weight: 185 lb 2 oz (84 kg)    GENERAL: Well-nourished well-developed; Alert, no distress and comfortable.  He is alone. EYES: no pallor or icterus OROPHARYNX: no thrush or  ulceration; poor dentition; Bil ear wax  NECK: vague swelling under the neck; tender; no skin changes.  LYMPH:  no palpable lymphadenopathy in the cervical, axillary or inguinal regions LUNGS: Decreased air entry bilaterally.  No wheeze or crackles HEART/CVS: regular rate & rhythm and no murmurs; No lower extremity edema ABDOMEN:abdomen soft, non-tender and normal bowel sounds Musculoskeletal:no cyanosis of digits and no clubbing; NO tenderness noted in the left chest wall. No lumps or bumps noted. PSYCH: alert & oriented x 3 with fluent speech NEURO: no focal motor/sensory deficits SKIN:  no rashes or significant lesions  LABORATORY DATA:  I have reviewed the data as listed  Component Value Date/Time   NA 132 (L) 05/14/2016 0940   K 4.1 05/14/2016 0940   CL 99 (L) 05/14/2016 0940   CO2 27 05/14/2016 0940   GLUCOSE 128 (H) 05/14/2016 0940   BUN 10 05/14/2016 0940   CREATININE 0.78 05/14/2016 0940   CREATININE 0.99 02/26/2014 1033   CALCIUM 8.9 05/14/2016 0940   CALCIUM 8.2 (L) 02/26/2014 1033   PROT 7.8 05/14/2016 0940   PROT 7.3 02/26/2014 1033   ALBUMIN 4.1 05/14/2016 0940   ALBUMIN 3.4 02/26/2014 1033   AST 19 05/14/2016 0940   AST 17 02/26/2014 1033   ALT 20 05/14/2016 0940   ALT 25 02/26/2014 1033   ALKPHOS 47 05/14/2016 0940   ALKPHOS 54 02/26/2014 1033   BILITOT 0.5 05/14/2016 0940   BILITOT 0.4 02/26/2014 1033   GFRNONAA >60 05/14/2016 0940   GFRNONAA >60 02/26/2014 1033   GFRAA >60 05/14/2016 0940   GFRAA >60 02/26/2014 1033    No results found for: SPEP, UPEP  Lab Results  Component Value Date   WBC 5.5 05/14/2016   NEUTROABS 3.3 05/14/2016   HGB 13.5 05/14/2016   HCT 39.8 (L) 05/14/2016   MCV 85.2 05/14/2016   PLT 216 05/14/2016      Chemistry      Component Value Date/Time   NA 132 (L) 05/14/2016 0940   K 4.1 05/14/2016 0940   CL 99 (L) 05/14/2016 0940   CO2 27 05/14/2016 0940   BUN 10 05/14/2016 0940   CREATININE 0.78 05/14/2016 0940    CREATININE 0.99 02/26/2014 1033      Component Value Date/Time   CALCIUM 8.9 05/14/2016 0940   CALCIUM 8.2 (L) 02/26/2014 1033   ALKPHOS 47 05/14/2016 0940   ALKPHOS 54 02/26/2014 1033   AST 19 05/14/2016 0940   AST 17 02/26/2014 1033   ALT 20 05/14/2016 0940   ALT 25 02/26/2014 1033   BILITOT 0.5 05/14/2016 0940   BILITOT 0.4 02/26/2014 1033      ASSESSMENT & PLAN:   Primary malignant neoplasm of left upper lobe of lung ([place]) # Small cell lung cancer- Extensive stage small cell lung cancer-Status post cycle #2 of carbo etoposide approximately 8 weeks ago. Cycle #3 interrupted because of right IJ DVT.   # June 2017-  CT scan shows significant improvement of the left upper lobe lung mass;  # proceed with carbo-etop # 3 days. Labs are adequate.  #  Improved/ Neck pain- improved;  S/p thrombolysis; on Lovenox.; financial issues.    # cough- get CXR.  # labs in 10 days; follow up in 3 weeks.   # 25 minutes face-to-face with the patient discussing the above plan of care; more than 50% of time spent on prognosis/ natural history; counseling and coordination.Also recommend tramadol.    Cammie Sickle, MD 05/14/2016 1:28 PM

## 2016-05-14 NOTE — Progress Notes (Signed)
Patient states he has a hacking cough.  Worse at night.  Otherwise, no complaints today.

## 2016-05-14 NOTE — Assessment & Plan Note (Addendum)
#   Small cell lung cancer- Extensive stage small cell lung cancer-Status post cycle #2 of carbo etoposide approximately 8 weeks ago. Cycle #3 interrupted because of right IJ DVT.   # June 2017-  CT scan shows significant improvement of the left upper lobe lung mass;  # proceed with carbo-etop # 3 days. Labs are adequate.  #  Improved/ Neck pain- improved;  S/p thrombolysis; on Lovenox.; financial issues.    # cough- get CXR.  # labs in 10 days; follow up in 3 weeks.

## 2016-05-15 ENCOUNTER — Inpatient Hospital Stay: Payer: Medicare Other

## 2016-05-15 VITALS — BP 130/71 | HR 80 | Resp 20

## 2016-05-15 DIAGNOSIS — C3492 Malignant neoplasm of unspecified part of left bronchus or lung: Secondary | ICD-10-CM

## 2016-05-15 DIAGNOSIS — C3412 Malignant neoplasm of upper lobe, left bronchus or lung: Secondary | ICD-10-CM

## 2016-05-15 MED ORDER — SODIUM CHLORIDE 0.9 % IV SOLN
10.0000 mg | Freq: Once | INTRAVENOUS | Status: AC
  Administered 2016-05-15: 10 mg via INTRAVENOUS
  Filled 2016-05-15: qty 1

## 2016-05-15 MED ORDER — ETOPOSIDE CHEMO INJECTION 1 GM/50ML
100.0000 mg/m2 | Freq: Once | INTRAVENOUS | Status: AC
  Administered 2016-05-15: 210 mg via INTRAVENOUS
  Filled 2016-05-15: qty 10.5

## 2016-05-15 MED ORDER — SODIUM CHLORIDE 0.9 % IV SOLN
Freq: Once | INTRAVENOUS | Status: AC
  Administered 2016-05-15: 14:00:00 via INTRAVENOUS
  Filled 2016-05-15: qty 1000

## 2016-05-15 MED ORDER — HEPARIN SOD (PORK) LOCK FLUSH 100 UNIT/ML IV SOLN: 500.0000 [IU] | Freq: Once | INTRAVENOUS | Status: DC | PRN

## 2016-05-16 ENCOUNTER — Inpatient Hospital Stay: Payer: Medicare Other

## 2016-05-16 DIAGNOSIS — C3492 Malignant neoplasm of unspecified part of left bronchus or lung: Secondary | ICD-10-CM

## 2016-05-16 DIAGNOSIS — C3412 Malignant neoplasm of upper lobe, left bronchus or lung: Secondary | ICD-10-CM

## 2016-05-16 MED ORDER — SODIUM CHLORIDE 0.9 % IV SOLN
10.0000 mg | Freq: Once | INTRAVENOUS | Status: AC
  Administered 2016-05-16: 10 mg via INTRAVENOUS
  Filled 2016-05-16: qty 1

## 2016-05-16 MED ORDER — SODIUM CHLORIDE 0.9 % IV SOLN
Freq: Once | INTRAVENOUS | Status: AC
  Administered 2016-05-16: 14:00:00 via INTRAVENOUS
  Filled 2016-05-16: qty 1000

## 2016-05-16 MED ORDER — PEGFILGRASTIM 6 MG/0.6ML ~~LOC~~ PSKT
6.0000 mg | PREFILLED_SYRINGE | Freq: Once | SUBCUTANEOUS | Status: AC
  Administered 2016-05-16: 6 mg via SUBCUTANEOUS
  Filled 2016-05-16: qty 0.6

## 2016-05-16 MED ORDER — ETOPOSIDE CHEMO INJECTION 1 GM/50ML
100.0000 mg/m2 | Freq: Once | INTRAVENOUS | Status: AC
  Administered 2016-05-16: 210 mg via INTRAVENOUS
  Filled 2016-05-16: qty 10.5

## 2016-05-17 ENCOUNTER — Other Ambulatory Visit: Payer: Self-pay | Admitting: *Deleted

## 2016-05-17 NOTE — Telephone Encounter (Signed)
Only has 5 more blood thinners left and states Nira Conn has to arrange refills if he is to continue taking them

## 2016-05-18 ENCOUNTER — Other Ambulatory Visit: Payer: Self-pay

## 2016-05-18 ENCOUNTER — Inpatient Hospital Stay
Admission: EM | Admit: 2016-05-18 | Discharge: 2016-05-23 | DRG: 190 | Disposition: A | Discharge status: Home Health | Payer: Medicare Other | Attending: Specialist | Admitting: Specialist

## 2016-05-18 ENCOUNTER — Emergency Department: Payer: Medicare Other

## 2016-05-18 ENCOUNTER — Inpatient Hospital Stay (HOSPITAL_COMMUNITY)
Admit: 2016-05-18 | Discharge: 2016-05-18 | Disposition: A | Discharge status: Home / Self Care | Payer: Medicare Other | Attending: Internal Medicine | Admitting: Internal Medicine

## 2016-05-18 DIAGNOSIS — R0902 Hypoxemia: Secondary | ICD-10-CM

## 2016-05-18 DIAGNOSIS — J9620 Acute and chronic respiratory failure, unspecified whether with hypoxia or hypercapnia: Secondary | ICD-10-CM | POA: Diagnosis not present

## 2016-05-18 DIAGNOSIS — Z923 Personal history of irradiation: Secondary | ICD-10-CM | POA: Diagnosis not present

## 2016-05-18 DIAGNOSIS — E119 Type 2 diabetes mellitus without complications: Secondary | ICD-10-CM | POA: Diagnosis present

## 2016-05-18 DIAGNOSIS — I82890 Acute embolism and thrombosis of other specified veins: Secondary | ICD-10-CM | POA: Diagnosis present

## 2016-05-18 DIAGNOSIS — Z7984 Long term (current) use of oral hypoglycemic drugs: Secondary | ICD-10-CM

## 2016-05-18 DIAGNOSIS — R7989 Other specified abnormal findings of blood chemistry: Secondary | ICD-10-CM

## 2016-05-18 DIAGNOSIS — Z7901 Long term (current) use of anticoagulants: Secondary | ICD-10-CM | POA: Diagnosis not present

## 2016-05-18 DIAGNOSIS — Z86718 Personal history of other venous thrombosis and embolism: Secondary | ICD-10-CM | POA: Diagnosis not present

## 2016-05-18 DIAGNOSIS — I447 Left bundle-branch block, unspecified: Secondary | ICD-10-CM | POA: Diagnosis present

## 2016-05-18 DIAGNOSIS — R112 Nausea with vomiting, unspecified: Secondary | ICD-10-CM | POA: Diagnosis present

## 2016-05-18 DIAGNOSIS — T451X5A Adverse effect of antineoplastic and immunosuppressive drugs, initial encounter: Secondary | ICD-10-CM | POA: Diagnosis present

## 2016-05-18 DIAGNOSIS — I214 Non-ST elevation (NSTEMI) myocardial infarction: Secondary | ICD-10-CM

## 2016-05-18 DIAGNOSIS — I5043 Acute on chronic combined systolic (congestive) and diastolic (congestive) heart failure: Secondary | ICD-10-CM | POA: Diagnosis present

## 2016-05-18 DIAGNOSIS — J9622 Acute and chronic respiratory failure with hypercapnia: Secondary | ICD-10-CM | POA: Diagnosis present

## 2016-05-18 DIAGNOSIS — F441 Dissociative fugue: Secondary | ICD-10-CM | POA: Diagnosis not present

## 2016-05-18 DIAGNOSIS — I252 Old myocardial infarction: Secondary | ICD-10-CM | POA: Diagnosis not present

## 2016-05-18 DIAGNOSIS — Z880 Allergy status to penicillin: Secondary | ICD-10-CM

## 2016-05-18 DIAGNOSIS — I110 Hypertensive heart disease with heart failure: Secondary | ICD-10-CM | POA: Diagnosis present

## 2016-05-18 DIAGNOSIS — C3412 Malignant neoplasm of upper lobe, left bronchus or lung: Secondary | ICD-10-CM | POA: Diagnosis not present

## 2016-05-18 DIAGNOSIS — D72829 Elevated white blood cell count, unspecified: Secondary | ICD-10-CM | POA: Diagnosis present

## 2016-05-18 DIAGNOSIS — J9621 Acute and chronic respiratory failure with hypoxia: Secondary | ICD-10-CM | POA: Diagnosis present

## 2016-05-18 DIAGNOSIS — R0603 Acute respiratory distress: Secondary | ICD-10-CM

## 2016-05-18 DIAGNOSIS — Z856 Personal history of leukemia: Secondary | ICD-10-CM

## 2016-05-18 DIAGNOSIS — C3410 Malignant neoplasm of upper lobe, unspecified bronchus or lung: Secondary | ICD-10-CM | POA: Diagnosis present

## 2016-05-18 DIAGNOSIS — R0600 Dyspnea, unspecified: Secondary | ICD-10-CM | POA: Diagnosis present

## 2016-05-18 DIAGNOSIS — T380X5A Adverse effect of glucocorticoids and synthetic analogues, initial encounter: Secondary | ICD-10-CM | POA: Diagnosis present

## 2016-05-18 DIAGNOSIS — Z66 Do not resuscitate: Secondary | ICD-10-CM | POA: Diagnosis present

## 2016-05-18 DIAGNOSIS — J9600 Acute respiratory failure, unspecified whether with hypoxia or hypercapnia: Secondary | ICD-10-CM | POA: Diagnosis present

## 2016-05-18 DIAGNOSIS — F17210 Nicotine dependence, cigarettes, uncomplicated: Secondary | ICD-10-CM | POA: Diagnosis present

## 2016-05-18 DIAGNOSIS — R0602 Shortness of breath: Secondary | ICD-10-CM

## 2016-05-18 DIAGNOSIS — R778 Other specified abnormalities of plasma proteins: Secondary | ICD-10-CM

## 2016-05-18 DIAGNOSIS — I2510 Atherosclerotic heart disease of native coronary artery without angina pectoris: Secondary | ICD-10-CM | POA: Diagnosis present

## 2016-05-18 DIAGNOSIS — Z888 Allergy status to other drugs, medicaments and biological substances status: Secondary | ICD-10-CM

## 2016-05-18 DIAGNOSIS — J441 Chronic obstructive pulmonary disease with (acute) exacerbation: Secondary | ICD-10-CM | POA: Diagnosis present

## 2016-05-18 DIAGNOSIS — Z9221 Personal history of antineoplastic chemotherapy: Secondary | ICD-10-CM

## 2016-05-18 DIAGNOSIS — J9601 Acute respiratory failure with hypoxia: Secondary | ICD-10-CM | POA: Diagnosis not present

## 2016-05-18 DIAGNOSIS — Z800 Family history of malignant neoplasm of digestive organs: Secondary | ICD-10-CM

## 2016-05-18 DIAGNOSIS — I82C11 Acute embolism and thrombosis of right internal jugular vein: Secondary | ICD-10-CM | POA: Diagnosis not present

## 2016-05-18 DIAGNOSIS — I272 Other secondary pulmonary hypertension: Secondary | ICD-10-CM | POA: Diagnosis present

## 2016-05-18 LAB — BLOOD CULTURE ID PANEL (REFLEXED)
ACINETOBACTER BAUMANNII: NOT DETECTED
CANDIDA ALBICANS: NOT DETECTED
CANDIDA GLABRATA: NOT DETECTED
Candida krusei: NOT DETECTED
Candida parapsilosis: NOT DETECTED
Candida tropicalis: NOT DETECTED
Carbapenem resistance: NOT DETECTED
ENTEROBACTER CLOACAE COMPLEX: NOT DETECTED
ESCHERICHIA COLI: NOT DETECTED
Enterobacteriaceae species: NOT DETECTED
Enterococcus species: NOT DETECTED
HAEMOPHILUS INFLUENZAE: NOT DETECTED
Klebsiella oxytoca: NOT DETECTED
Klebsiella pneumoniae: NOT DETECTED
Listeria monocytogenes: NOT DETECTED
METHICILLIN RESISTANCE: NOT DETECTED
NEISSERIA MENINGITIDIS: NOT DETECTED
PROTEUS SPECIES: NOT DETECTED
Pseudomonas aeruginosa: NOT DETECTED
STREPTOCOCCUS PNEUMONIAE: NOT DETECTED
STREPTOCOCCUS PYOGENES: NOT DETECTED
STREPTOCOCCUS SPECIES: DETECTED — AB
Serratia marcescens: NOT DETECTED
Staphylococcus aureus (BCID): NOT DETECTED
Staphylococcus species: NOT DETECTED
Streptococcus agalactiae: NOT DETECTED
Vancomycin resistance: NOT DETECTED

## 2016-05-18 LAB — CBC WITH DIFFERENTIAL/PLATELET
BASOS ABS: 0.1 10*3/uL (ref 0–0.1)
BASOS PCT: 0 %
EOS PCT: 1 %
Eosinophils Absolute: 0.2 10*3/uL (ref 0–0.7)
HCT: 38.4 % — ABNORMAL LOW (ref 40.0–52.0)
Hemoglobin: 13 g/dL (ref 13.0–18.0)
LYMPHS PCT: 12 %
Lymphs Abs: 3.3 10*3/uL (ref 1.0–3.6)
MCH: 29.5 pg (ref 26.0–34.0)
MCHC: 33.8 g/dL (ref 32.0–36.0)
MCV: 87.3 fL (ref 80.0–100.0)
MONO ABS: 0.2 10*3/uL (ref 0.2–1.0)
Monocytes Relative: 1 %
Neutro Abs: 24.4 10*3/uL — ABNORMAL HIGH (ref 1.4–6.5)
Neutrophils Relative %: 86 %
PLATELETS: 219 10*3/uL (ref 150–440)
RBC: 4.4 MIL/uL (ref 4.40–5.90)
RDW: 17.3 % — AB (ref 11.5–14.5)
WBC: 28.2 10*3/uL — AB (ref 3.8–10.6)

## 2016-05-18 LAB — TROPONIN I
TROPONIN I: 0.2 ng/mL — AB (ref <0.03)
Troponin I: 0.42 ng/mL (ref <0.03)
Troponin I: 2.11 ng/mL (ref <0.03)
Troponin I: 2.08 ng/mL (ref <0.03)

## 2016-05-18 LAB — COMPREHENSIVE METABOLIC PANEL
ALBUMIN: 4.1 g/dL (ref 3.5–5.0)
ALT: 42 U/L (ref 17–63)
ANION GAP: 8 (ref 5–15)
AST: 36 U/L (ref 15–41)
Alkaline Phosphatase: 47 U/L (ref 38–126)
BUN: 16 mg/dL (ref 6–20)
CALCIUM: 8.8 mg/dL — AB (ref 8.9–10.3)
CHLORIDE: 101 mmol/L (ref 101–111)
CO2: 28 mmol/L (ref 22–32)
Creatinine, Ser: 0.88 mg/dL (ref 0.61–1.24)
GFR calc non Af Amer: >60 mL/min (ref >60)
GLUCOSE: 248 mg/dL — AB (ref 65–99)
POTASSIUM: 4.7 mmol/L (ref 3.5–5.1)
SODIUM: 137 mmol/L (ref 135–145)
Total Bilirubin: 1.1 mg/dL (ref 0.3–1.2)
Total Protein: 7.8 g/dL (ref 6.5–8.1)

## 2016-05-18 LAB — BLOOD GAS, ARTERIAL
ACID-BASE DEFICIT: 0.3 mmol/L (ref 0.0–2.0)
BICARBONATE: 28.3 meq/L — AB (ref 21.0–28.0)
Delivery systems: POSITIVE
EXPIRATORY PAP: 5
FIO2: 40
Inspiratory PAP: 16
Mechanical Rate: 10
O2 SAT: 97.1 %
PATIENT TEMPERATURE: 37
PO2 ART: 104 mmHg (ref 83.0–108.0)
pCO2 arterial: 63 mmHg — ABNORMAL HIGH (ref 32.0–48.0)
pH, Arterial: 7.26 — ABNORMAL LOW (ref 7.350–7.450)

## 2016-05-18 LAB — MRSA PCR SCREENING: MRSA by PCR: NEGATIVE

## 2016-05-18 LAB — APTT
APTT: 37 s — AB (ref 24–36)
APTT: 67 s — AB (ref 24–36)
aPTT: 69 seconds — ABNORMAL HIGH (ref 24–36)

## 2016-05-18 LAB — TSH: TSH: 2.29 u[IU]/mL (ref 0.350–4.500)

## 2016-05-18 LAB — ECHOCARDIOGRAM COMPLETE
Height: 69 in
WEIGHTICAEL: 3037.06 [oz_av]

## 2016-05-18 LAB — HEPARIN LEVEL (UNFRACTIONATED)
HEPARIN UNFRACTIONATED: 0.96 [IU]/mL — AB (ref 0.30–0.70)
Heparin Unfractionated: 0.8 IU/mL — ABNORMAL HIGH (ref 0.30–0.70)
Heparin Unfractionated: 0.6 IU/mL (ref 0.30–0.70)

## 2016-05-18 LAB — GLUCOSE, CAPILLARY
Glucose-Capillary: 244 mg/dL — ABNORMAL HIGH (ref 65–99)
Glucose-Capillary: 256 mg/dL — ABNORMAL HIGH (ref 65–99)
Glucose-Capillary: 210 mg/dL — ABNORMAL HIGH (ref 65–99)
Glucose-Capillary: 204 mg/dL — ABNORMAL HIGH (ref 65–99)

## 2016-05-18 LAB — BRAIN NATRIURETIC PEPTIDE: B NATRIURETIC PEPTIDE 5: 265 pg/mL — AB (ref 0.0–100.0)

## 2016-05-18 LAB — PROTIME-INR
INR: 0.98
Prothrombin Time: 13 seconds (ref 11.4–15.2)

## 2016-05-18 LAB — HEMOGLOBIN A1C: HEMOGLOBIN A1C: 6.1 % — AB (ref 4.0–6.0)

## 2016-05-18 MED ORDER — METHYLPREDNISOLONE SODIUM SUCC 40 MG IJ SOLR
40.0000 mg | Freq: Two times a day (BID) | INTRAMUSCULAR | Status: DC
  Administered 2016-05-18 – 2016-05-19 (×2): 40 mg via INTRAVENOUS
  Filled 2016-05-18 (×2): qty 1

## 2016-05-18 MED ORDER — ALBUTEROL SULFATE (2.5 MG/3ML) 0.083% IN NEBU
INHALATION_SOLUTION | RESPIRATORY_TRACT | Status: AC
Start: 2016-05-18 — End: 2016-05-18
  Administered 2016-05-18: 10 mg via RESPIRATORY_TRACT
  Filled 2016-05-18: qty 12

## 2016-05-18 MED ORDER — IPRATROPIUM-ALBUTEROL 0.5-2.5 (3) MG/3ML IN SOLN
3.0000 mL | RESPIRATORY_TRACT | Status: DC
  Administered 2016-05-18 – 2016-05-23 (×28): 3 mL via RESPIRATORY_TRACT
  Filled 2016-05-18 (×29): qty 3

## 2016-05-18 MED ORDER — ONDANSETRON HCL 4 MG PO TABS: 4.0000 mg | ORAL_TABLET | Freq: Four times a day (QID) | ORAL | Status: DC | PRN

## 2016-05-18 MED ORDER — TIOTROPIUM BROMIDE MONOHYDRATE 18 MCG IN CAPS
18.0000 ug | ORAL_CAPSULE | Freq: Every day | RESPIRATORY_TRACT | Status: DC
  Administered 2016-05-18 – 2016-05-22 (×5): 18 ug via RESPIRATORY_TRACT
  Filled 2016-05-18: qty 5

## 2016-05-18 MED ORDER — LORAZEPAM 2 MG/ML IJ SOLN
0.5000 mg | Freq: Once | INTRAMUSCULAR | Status: AC
  Administered 2016-05-18: 0.5 mg via INTRAVENOUS

## 2016-05-18 MED ORDER — METOPROLOL SUCCINATE ER 25 MG PO TB24
25.0000 mg | ORAL_TABLET | Freq: Every day | ORAL | Status: DC
  Administered 2016-05-18: 25 mg via ORAL
  Filled 2016-05-18: qty 1

## 2016-05-18 MED ORDER — HEPARIN (PORCINE) IN NACL 100-0.45 UNIT/ML-% IJ SOLN: 12.0000 [IU]/kg/h | Freq: Once | INTRAMUSCULAR | Status: DC

## 2016-05-18 MED ORDER — DOCUSATE SODIUM 100 MG PO CAPS
100.0000 mg | ORAL_CAPSULE | Freq: Two times a day (BID) | ORAL | Status: DC
  Administered 2016-05-18 – 2016-05-22 (×8): 100 mg via ORAL
  Filled 2016-05-18 (×11): qty 1

## 2016-05-18 MED ORDER — HEPARIN (PORCINE) IN NACL 100-0.45 UNIT/ML-% IJ SOLN
1150.0000 [IU]/h | INTRAMUSCULAR | Status: DC
  Administered 2016-05-18 – 2016-05-19 (×3): 1000 [IU]/h via INTRAVENOUS
  Filled 2016-05-18 (×3): qty 250

## 2016-05-18 MED ORDER — CETYLPYRIDINIUM CHLORIDE 0.05 % MT LIQD
7.0000 mL | Freq: Two times a day (BID) | OROMUCOSAL | Status: DC
  Administered 2016-05-18 – 2016-05-22 (×8): 7 mL via OROMUCOSAL

## 2016-05-18 MED ORDER — SODIUM CHLORIDE 0.9% FLUSH
3.0000 mL | Freq: Two times a day (BID) | INTRAVENOUS | Status: DC
  Administered 2016-05-18 – 2016-05-23 (×11): 3 mL via INTRAVENOUS

## 2016-05-18 MED ORDER — ONDANSETRON HCL 4 MG/2ML IJ SOLN
4.0000 mg | Freq: Four times a day (QID) | INTRAMUSCULAR | Status: DC | PRN
Start: 2016-05-18 — End: 2016-05-23
  Administered 2016-05-21: 4 mg via INTRAVENOUS
  Filled 2016-05-18: qty 2

## 2016-05-18 MED ORDER — IPRATROPIUM-ALBUTEROL 0.5-2.5 (3) MG/3ML IN SOLN
RESPIRATORY_TRACT | Status: AC
  Administered 2016-05-18: 3 mL via RESPIRATORY_TRACT
  Filled 2016-05-18: qty 3

## 2016-05-18 MED ORDER — ONDANSETRON HCL 4 MG/2ML IJ SOLN
4.0000 mg | Freq: Once | INTRAMUSCULAR | Status: AC
  Administered 2016-05-18: 4 mg via INTRAVENOUS

## 2016-05-18 MED ORDER — INSULIN ASPART 100 UNIT/ML ~~LOC~~ SOLN
0.0000 [IU] | Freq: Every day | SUBCUTANEOUS | Status: DC
  Administered 2016-05-18: 2 [IU] via SUBCUTANEOUS
  Administered 2016-05-19 – 2016-05-22 (×4): 3 [IU] via SUBCUTANEOUS
  Filled 2016-05-18: qty 2
  Filled 2016-05-18 (×3): qty 3

## 2016-05-18 MED ORDER — IPRATROPIUM-ALBUTEROL 0.5-2.5 (3) MG/3ML IN SOLN
3.0000 mL | Freq: Four times a day (QID) | RESPIRATORY_TRACT | Status: DC
  Administered 2016-05-18: 3 mL via RESPIRATORY_TRACT

## 2016-05-18 MED ORDER — METOPROLOL SUCCINATE ER 50 MG PO TB24
50.0000 mg | ORAL_TABLET | Freq: Every day | ORAL | Status: DC
  Administered 2016-05-19 – 2016-05-23 (×4): 50 mg via ORAL
  Filled 2016-05-18 (×5): qty 1

## 2016-05-18 MED ORDER — OXYCODONE HCL 5 MG PO TABS: 5.0000 mg | ORAL_TABLET | ORAL | Status: DC | PRN

## 2016-05-18 MED ORDER — FUROSEMIDE 10 MG/ML IJ SOLN
20.0000 mg | Freq: Once | INTRAMUSCULAR | Status: AC
  Administered 2016-05-18: 20 mg via INTRAVENOUS
  Filled 2016-05-18: qty 2

## 2016-05-18 MED ORDER — DEXTROSE 5 % IV SOLN
2.0000 g | Freq: Once | INTRAVENOUS | Status: AC
  Administered 2016-05-18: 2 g via INTRAVENOUS
  Filled 2016-05-18: qty 2

## 2016-05-18 MED ORDER — LABETALOL HCL 5 MG/ML IV SOLN
10.0000 mg | Freq: Once | INTRAVENOUS | Status: AC
  Administered 2016-05-18: 10 mg via INTRAVENOUS
  Filled 2016-05-18: qty 4

## 2016-05-18 MED ORDER — LORAZEPAM 2 MG/ML IJ SOLN
INTRAMUSCULAR | Status: AC
  Administered 2016-05-18: 0.5 mg via INTRAVENOUS
  Filled 2016-05-18: qty 1

## 2016-05-18 MED ORDER — ENOXAPARIN SODIUM 80 MG/0.8ML ~~LOC~~ SOLN: 80.0000 mg | Freq: Two times a day (BID) | SUBCUTANEOUS | Status: DC

## 2016-05-18 MED ORDER — IPRATROPIUM-ALBUTEROL 0.5-2.5 (3) MG/3ML IN SOLN
3.0000 mL | Freq: Once | RESPIRATORY_TRACT | Status: AC
  Administered 2016-05-18: 3 mL via RESPIRATORY_TRACT

## 2016-05-18 MED ORDER — INSULIN ASPART 100 UNIT/ML ~~LOC~~ SOLN
0.0000 [IU] | Freq: Three times a day (TID) | SUBCUTANEOUS | Status: DC
  Administered 2016-05-18: 3 [IU] via SUBCUTANEOUS
  Administered 2016-05-18: 5 [IU] via SUBCUTANEOUS
  Administered 2016-05-19: 2 [IU] via SUBCUTANEOUS
  Administered 2016-05-19: 3 [IU] via SUBCUTANEOUS
  Administered 2016-05-19: 2 [IU] via SUBCUTANEOUS
  Administered 2016-05-20 – 2016-05-21 (×5): 3 [IU] via SUBCUTANEOUS
  Administered 2016-05-21: 2 [IU] via SUBCUTANEOUS
  Administered 2016-05-22: 5 [IU] via SUBCUTANEOUS
  Administered 2016-05-22 (×2): 3 [IU] via SUBCUTANEOUS
  Administered 2016-05-23: 9 [IU] via SUBCUTANEOUS
  Administered 2016-05-23: 2 [IU] via SUBCUTANEOUS
  Filled 2016-05-18: qty 3
  Filled 2016-05-18: qty 5
  Filled 2016-05-18: qty 3
  Filled 2016-05-18: qty 9
  Filled 2016-05-18 (×3): qty 2
  Filled 2016-05-18: qty 3
  Filled 2016-05-18: qty 2
  Filled 2016-05-18: qty 5
  Filled 2016-05-18 (×7): qty 3

## 2016-05-18 MED ORDER — ATORVASTATIN CALCIUM 20 MG PO TABS
40.0000 mg | ORAL_TABLET | Freq: Every day | ORAL | Status: DC
  Administered 2016-05-19 – 2016-05-22 (×4): 40 mg via ORAL
  Filled 2016-05-18 (×4): qty 2

## 2016-05-18 MED ORDER — ACETAMINOPHEN 650 MG RE SUPP: 650.0000 mg | Freq: Four times a day (QID) | RECTAL | Status: DC | PRN

## 2016-05-18 MED ORDER — ALBUTEROL SULFATE (2.5 MG/3ML) 0.083% IN NEBU
3.0000 mL | INHALATION_SOLUTION | RESPIRATORY_TRACT | Status: DC | PRN
  Administered 2016-05-19: 3 mL via RESPIRATORY_TRACT
  Filled 2016-05-18: qty 3

## 2016-05-18 MED ORDER — METHYLPREDNISOLONE SODIUM SUCC 125 MG IJ SOLR
60.0000 mg | INTRAMUSCULAR | Status: DC
  Administered 2016-05-18: 60 mg via INTRAVENOUS
  Filled 2016-05-18: qty 2

## 2016-05-18 MED ORDER — HEPARIN SODIUM (PORCINE) 5000 UNIT/ML IJ SOLN
4000.0000 [IU] | Freq: Once | INTRAMUSCULAR | Status: AC
  Administered 2016-05-18: 4000 [IU] via INTRAVENOUS

## 2016-05-18 MED ORDER — ALBUTEROL SULFATE (2.5 MG/3ML) 0.083% IN NEBU
10.0000 mg | INHALATION_SOLUTION | Freq: Once | RESPIRATORY_TRACT | Status: AC
  Administered 2016-05-18: 10 mg via RESPIRATORY_TRACT

## 2016-05-18 MED ORDER — ASPIRIN 325 MG PO TABS
325.0000 mg | ORAL_TABLET | Freq: Every day | ORAL | Status: DC
  Administered 2016-05-18 – 2016-05-20 (×3): 325 mg via ORAL
  Filled 2016-05-18 (×4): qty 1

## 2016-05-18 MED ORDER — ACETAMINOPHEN 325 MG PO TABS: 650.0000 mg | ORAL_TABLET | Freq: Four times a day (QID) | ORAL | Status: DC | PRN

## 2016-05-18 MED ORDER — DEXTROSE 5 % IV SOLN
500.0000 mg | INTRAVENOUS | Status: DC
  Administered 2016-05-18 – 2016-05-20 (×3): 500 mg via INTRAVENOUS
  Filled 2016-05-18 (×3): qty 500

## 2016-05-18 MED ORDER — VANCOMYCIN HCL IN DEXTROSE 1-5 GM/200ML-% IV SOLN
1000.0000 mg | Freq: Once | INTRAVENOUS | Status: AC
  Administered 2016-05-18: 1000 mg via INTRAVENOUS
  Filled 2016-05-18: qty 200

## 2016-05-18 MED ORDER — CEFEPIME HCL 2 G IJ SOLR
2.0000 g | Freq: Three times a day (TID) | INTRAMUSCULAR | Status: DC
  Administered 2016-05-18 – 2016-05-21 (×9): 2 g via INTRAVENOUS
  Filled 2016-05-18 (×12): qty 2

## 2016-05-18 MED ORDER — LORAZEPAM 2 MG/ML IJ SOLN
0.5000 mg | Freq: Once | INTRAMUSCULAR | Status: AC
  Administered 2016-05-18: 0.5 mg via INTRAVENOUS
  Filled 2016-05-18: qty 1

## 2016-05-18 MED ORDER — NITROGLYCERIN 2 % TD OINT
1.0000 [in_us] | TOPICAL_OINTMENT | Freq: Once | TRANSDERMAL | Status: AC
  Administered 2016-05-18: 1 [in_us] via TOPICAL
  Filled 2016-05-18: qty 1

## 2016-05-18 MED ORDER — METOPROLOL SUCCINATE ER 25 MG PO TB24
25.0000 mg | ORAL_TABLET | Freq: Once | ORAL | Status: AC
  Administered 2016-05-18: 25 mg via ORAL
  Filled 2016-05-18: qty 1

## 2016-05-18 MED ORDER — NITROGLYCERIN 2 % TD OINT
0.5000 [in_us] | TOPICAL_OINTMENT | Freq: Three times a day (TID) | TRANSDERMAL | Status: DC
  Administered 2016-05-18 – 2016-05-22 (×6): 0.5 [in_us] via TOPICAL
  Filled 2016-05-18 (×7): qty 1

## 2016-05-18 MED ORDER — SODIUM CHLORIDE 0.9 % IV SOLN
INTRAVENOUS | Status: DC
  Administered 2016-05-18: 11:00:00 via INTRAVENOUS

## 2016-05-18 NOTE — Progress Notes (Addendum)
PHARMACY - PHYSICIAN COMMUNICATION CRITICAL VALUE ALERT - BLOOD CULTURE IDENTIFICATION (BCID)  Results for orders placed or performed during the hospital encounter of 05/18/16  Blood Culture ID Panel (Reflexed) (Collected: 05/18/2016  3:52 AM)  Result Value Ref Range   Enterococcus species NOT DETECTED NOT DETECTED   Vancomycin resistance NOT DETECTED NOT DETECTED   Listeria monocytogenes NOT DETECTED NOT DETECTED   Staphylococcus species NOT DETECTED NOT DETECTED   Staphylococcus aureus NOT DETECTED NOT DETECTED   Methicillin resistance NOT DETECTED NOT DETECTED   Streptococcus species DETECTED (A) NOT DETECTED   Streptococcus agalactiae NOT DETECTED NOT DETECTED   Streptococcus pneumoniae NOT DETECTED NOT DETECTED   Streptococcus pyogenes NOT DETECTED NOT DETECTED   Acinetobacter baumannii NOT DETECTED NOT DETECTED   Enterobacteriaceae species NOT DETECTED NOT DETECTED   Enterobacter cloacae complex NOT DETECTED NOT DETECTED   Escherichia coli NOT DETECTED NOT DETECTED   Klebsiella oxytoca NOT DETECTED NOT DETECTED   Klebsiella pneumoniae NOT DETECTED NOT DETECTED   Proteus species NOT DETECTED NOT DETECTED   Serratia marcescens NOT DETECTED NOT DETECTED   Carbapenem resistance NOT DETECTED NOT DETECTED   Haemophilus influenzae NOT DETECTED NOT DETECTED   Neisseria meningitidis NOT DETECTED NOT DETECTED   Pseudomonas aeruginosa NOT DETECTED NOT DETECTED   Candida albicans NOT DETECTED NOT DETECTED   Candida glabrata NOT DETECTED NOT DETECTED   Candida krusei NOT DETECTED NOT DETECTED   Candida parapsilosis NOT DETECTED NOT DETECTED   Candida tropicalis NOT DETECTED NOT DETECTED    Name of physician (or Provider) Contacted: [facility]  Changes to prescribed antibiotics required: n/a  Lab called again with Gram stain GPC. No changes per conversation with hospitalist.  Ada Holness S 05/18/2016  10:44 PM

## 2016-05-18 NOTE — Telephone Encounter (Signed)
Patient in hospital this morning

## 2016-05-18 NOTE — ED Notes (Signed)
Patient's wife confirmed that patient does not wish to be intubated or resuscitated, but had not been able to obtain out of hospital DNR prior to coming to ER tonight.  EDP aware.

## 2016-05-18 NOTE — ED Provider Notes (Signed)
[facility] Emergency Department Provider Note  L5 caveat: Review of systems and history is limited by respiratory distress      Time seen: ----------------------------------------- 3:13 AM on 05/18/2016 -----------------------------------------    I have reviewed the triage vital signs and the nursing notes.   HISTORY  Chief Complaint Respiratory Distress    HPI Richard Jimenez is a 75 y.o. male brought to the ER in respiratory distress. Patient states he's had trouble breathing all day. He's been taking breathing treatments throughout the day without significant improvement in his symptoms. He denies fever but has severe shortness of breath. He was going to drive himself here but began feeling so short of breath and he called EMS. He doesn't history of COPD and lung cancer.   Past Medical History:  Diagnosis Date  . Asthma   . CML (chronic myelocytic leukemia) ([place])   . COPD (chronic obstructive pulmonary disease) ([place])   . Diabetes mellitus type II, controlled ([place])   . Diabetes mellitus without complication ([place])   . Hypertension   . 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 Lung cancer, upper lobe ([place]) 2009  . Myocardial infarct ([place])   . Small cell lung cancer [facility])     Patient Active Problem List   Diagnosis Date Noted  . Acute DVT (deep venous thrombosis) ([place]) 04/25/2016  . Primary malignant neoplasm of left upper lobe of lung ([place]) 04/18/2016  . DVT of axillary vein, acute right 04/18/2016  . Frequent headaches 04/18/2016  . DVT (deep venous thrombosis), right 04/18/2016  . Jugular vein thrombosis, right   . Internal jugular vein thrombosis ([place]) 04/06/2016  . Neck pain 04/06/2016  . Hyponatremia 04/06/2016  . Anemia 04/06/2016  . Dehydration 04/04/2016  . Dysphagia 04/04/2016  . Small cell lung cancer ([place]) 02/06/2016  . Type 2 diabetes mellitus ([place]) 07/08/2015  . Lung cancer, upper lobe ([place]) 03/29/2015  . Chronic obstructive  pulmonary disease ([place]) 04/21/2012  . Breath shortness 04/21/2012  . H/O malignant neoplasm 04/21/2012  . H/O acute myocardial infarction 04/21/2012  . Arthritis, degenerative 04/21/2012  . BP (high blood pressure) 04/21/2012  . 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 PERIPHERAL VASCULAR CATHETERIZATION N/A 02/21/2016   Procedure: Glori Luis Cath Insertion;  Surgeon: Algernon Huxley, MD;  Location: [facility] CV LAB;  Service: Cardiovascular;  Laterality: N/A;  . PERIPHERAL VASCULAR CATHETERIZATION Right 04/19/2016   Procedure: Thrombectomy/thrombolysis right IJ DVT;  Surgeon: Algernon Huxley, MD;  Location: [facility] CV LAB;  Service: Cardiovascular;  Laterality: Right;  . PORT-A-CATH REMOVAL N/A 04/07/2016   Procedure: REMOVAL PORT-A-CATH;  Surgeon: Serafina Mitchell, MD;  Location: [facility] ORS;  Service: Vascular;  Laterality: N/A;  . THROAT SURGERY    . TUMOR REMOVAL Right    "years ago-right lower posterior side"    Allergies Clarithromycin and Penicillins  Social History Social History  Substance Use Topics  . Smoking status: Current Every Day Smoker    Packs/day: 1.00    Years: 62.00    Types: Cigarettes  . Smokeless tobacco: Not on file  . Alcohol use 3.6 oz/week    6 Cans of beer per week     Comment: rarely    Review of Systems Constitutional: Negative for fever. Cardiovascular: Negative for chest pain. Respiratory: Positive for shortness of breath History is otherwise unknown at this time  ____________________________________________   PHYSICAL EXAM:  VITAL SIGNS: ED Triage Vitals  Enc Vitals Group     BP      Pulse      Resp      Temp      Temp src      SpO2      Weight      Height      Head Circumference      Peak Flow      Pain Score      Pain Loc      Pain Edu?      Excl. in [place]?     Constitutional: Alert,  Ill-appearing, moderate distress Eyes: Conjunctivae are normal. PERRL. Normal extraocular  movements. ENT   Head: Normocephalic and atraumatic.   Nose: No congestion/rhinnorhea.   Mouth/Throat: Mucous membranes are moist.   Neck: No stridor. Cardiovascular: Normal rate, regular rhythm. No murmurs, rubs, or gallops. Respiratory: Tachypnea with wheezing and rhonchi bilaterally. Gastrointestinal: Soft and nontender. Normal bowel sounds Musculoskeletal: Nontender with normal range of motion in all extremities. No lower extremity tenderness nor edema. Neurologic:  Normal speech and language. No gross focal neurologic deficits are appreciated.  Skin:  Skin is warm with diaphoresis Psychiatric: Mood and affect are normal. ____________________________________________  EKG: Interpreted by me. Sinus tachycardia with a rate of 139 bpm, normal PR interval, wide QRS, normal QT interval. Left bundle branch block. PVC  ____________________________________________  ED COURSE:  Pertinent labs & imaging results that were available during my care of the patient were reviewed by me and considered in my medical decision making (see chart for details). Clinical Course  Comment By Time  According to his wife he is DO NOT RESUSCITATE and DO NOT INTUBATE. Patient continues to look poorly Earleen Newport, MD 08/11 [phone]  Patient presents the ER in respiratory distress, likely COPD exacerbation. We will check basic labs, given additional DuoNeb and likely place on BiPAP.  Procedures ____________________________________________   LABS (pertinent positives/negatives)  Labs Reviewed  CBC WITH DIFFERENTIAL/PLATELET  BRAIN NATRIURETIC PEPTIDE  TROPONIN I  COMPREHENSIVE METABOLIC PANEL  BLOOD GAS, VENOUS   CRITICAL CARE Performed by: Earleen Newport   Total critical care time: 30 minutes  Critical care time was exclusive of separately billable procedures and treating other patients.  Critical care was necessary to treat or prevent imminent or life-threatening  deterioration.  Critical care was time spent personally by me on the following activities: development of treatment plan with patient and/or surrogate as well as nursing, discussions with consultants, evaluation of patient's response to treatment, examination of patient, obtaining history from patient or surrogate, ordering and performing treatments and interventions, ordering and review of laboratory studies, ordering and review of radiographic studies, pulse oximetry and re-evaluation of patient's condition.  RADIOLOGY Images were viewed by me  Chest x-ray IMPRESSION: 1. Increased interstitial markings may reflect mild interstitial edema. Vascular congestion noted. 2. Left apical opacity again noted. This may simply reflect post therapy scarring, though residual or recurrent mass cannot be entirely excluded. This could be assessed on PET/CT, depending on the degree of clinical concern.  ____________________________________________  FINAL ASSESSMENT AND PLAN  Acute respiratory distress with hypoxia and hypercarbia, COPD exacerbation, elevated troponin  Plan: Patient with labs and imaging as dictated above. Patient family are adamant that he is DO NOT RESUSCITATE and DO NOT INTUBATE. Initially he looked ill enough to require intubation. He has a markedly elevated white blood cell count, we have started broad-spectrum antibiotics. Troponin elevation is likely demand related. Heart rate still in the 140s. I  will write for labetalol for blood pressure control, he will be placed on heparin for elevated troponin.   Earleen Newport, MD   Note: This dictation was prepared with Dragon dictation. Any transcriptional errors that result from this process are unintentional    Earleen Newport, MD 05/18/16 [phone]

## 2016-05-18 NOTE — Plan of Care (Signed)
Problem: Physical Regulation: Goal: Ability to maintain clinical measurements within normal limits will improve Outcome: Progressing Patient taken off Bipap upon arrival to unit per RT and RN, patient without elevated work of breathing and O2 sats 100% on 40% bipap. Initally placed on 4 L nasal cannula but titrated down to 2 L nasal cannula. O2 sats maintained above 92%. Patient with initially a lot of wheezing upon arrival, relieved by scheduled breathing treatment but returned in evening, patient declined prn breathing treatment. Patient initially lethargic but able to answer questions appropriately with only confusion about date (arrived in E.D. At 03:00 per family and per report given ativan in E.D.). Patient became more alert throughout shift before becoming completely alert in evening.

## 2016-05-18 NOTE — ED Notes (Signed)
Spoke with Dr. Marcille Blanco regarding patient pulling off mask.  MD okay'd trying patient on [place] at 4-6L and to see how pt tolerates.  If pt unable to tolerate, verbal order given for ativan 0.'5mg'$  IV so that patient will not pull off bipap mask.

## 2016-05-18 NOTE — Progress Notes (Addendum)
Notified Dr. Ether Griffins of critical troponin of 2.11. MD questioned patient's heart rate after metoprolol administration earlier, currently heart rate 87. MD ordered a one time of 25 mg extended release to bridge patient to new dose of 50 mg once a day. MD also confirmed that nitro paste in place. MD also ordered asiprin 325 mg daily. MD also ordered routine cardiology consult. Team will continue to monitor.

## 2016-05-18 NOTE — Progress Notes (Addendum)
ANTICOAGULATION CONSULT NOTE - Initial Consult  Pharmacy Consult for heparin drip Indication: chest pain/ACS/STEMI  Allergies  Allergen Reactions  . Clarithromycin Nausea And Vomiting  . Penicillins Nausea And Vomiting, Rash and Other (See Comments)    Has patient had a PCN reaction causing immediate rash, facial/tongue/throat swelling, SOB or lightheadedness with hypotension: No Has patient had a PCN reaction causing severe rash involving mucus membranes or skin necrosis: No Has patient had a PCN reaction that required hospitalization No Has patient had a PCN reaction occurring within the last 10 years: No If all of the above answers are "NO", then may proceed with Cephalosporin use.    Patient Measurements: Height: '5\' 8"'$  (172.7 cm) Weight: 180 lb (81.6 kg) IBW/kg (Calculated) : 68.4 Heparin Dosing Weight: 81.6kg  Vital Signs: BP: 172/97 (08/11 0330) Pulse Rate: 140 (08/11 0330)  Labs:  Recent Labs  05/18/16 0313  HGB 13.0  HCT 38.4*  PLT 219  CREATININE 0.88  TROPONINI 0.20*    Estimated Creatinine Clearance: 71.3 mL/min (by C-G formula based on SCr of 0.88 mg/dL).   Medical History: Past Medical History:  Diagnosis Date  . Asthma   . CML (chronic myelocytic leukemia) ([place])   . COPD (chronic obstructive pulmonary disease) ([place])   . Diabetes mellitus type II, controlled ([place])   . Diabetes mellitus without complication ([place])   . Hypertension   . 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 Lung cancer, upper lobe ([place]) 2009  . Myocardial infarct ([place])   . Small cell lung cancer ([facility])     Medications:  Has apixaban as outpatient but not taking per med rec. Also has enoxaparin therapeutic dose as outpatient medication but unknown last dose.  Assessment:  Goal of Therapy:  Heparin level 0.3-0.7 units/ml Monitor platelets by anticoagulation protocol: Yes   Plan:  Baseline PT/INR, aPTT, and anti-Xa ordered. ER RN requesting to start infusion. Will go ahead and  start with standard bolus and infusion as ordered by MD due to unknown home med status and labs pending.  F/u aPTT and heparin level 6 hours after start of infusion.  Jeanna Giuffre S 05/18/2016,4:32 AM

## 2016-05-18 NOTE — Progress Notes (Signed)
ANTICOAGULATION CONSULT NOTE -FOLLOW UP   Pharmacy Consult for heparin drip Indication: chest pain/ACS/STEMI  Allergies  Allergen Reactions  . Clarithromycin Nausea And Vomiting  . Penicillins Nausea And Vomiting, Rash and Other (See Comments)    Has patient had a PCN reaction causing immediate rash, facial/tongue/throat swelling, SOB or lightheadedness with hypotension: No Has patient had a PCN reaction causing severe rash involving mucus membranes or skin necrosis: No Has patient had a PCN reaction that required hospitalization No Has patient had a PCN reaction occurring within the last 10 years: No If all of the above answers are "NO", then may proceed with Cephalosporin use.    Patient Measurements: Height: '5\' 9"'$  (175.3 cm) Weight: 189 lb 13.1 oz (86.1 kg) IBW/kg (Calculated) : 70.7 Heparin Dosing Weight: 81.6kg  Vital Signs: Temp: 98.3 F (36.8 C) (08/11 2000) Temp Source: Oral (08/11 2000) BP: 124/63 (08/11 1800) Pulse Rate: 87 (08/11 1800)  Labs:  Recent Labs  05/18/16 0313 05/18/16 0737 05/18/16 1314 05/18/16 1927  HGB 13.0  --   --   --   HCT 38.4*  --   --   --   PLT 219  --   --   --   APTT 37*  --  69* 67*  LABPROT 13.0  --   --   --   INR 0.98  --   --   --   HEPARINUNFRC 0.96*  --  0.80* 0.60  CREATININE 0.88  --   --   --   TROPONINI 0.20* 0.42* 2.11* 2.08*    Estimated Creatinine Clearance: 80.1 mL/min (by C-G formula based on SCr of 0.88 mg/dL).   Medical History: Past Medical History:  Diagnosis Date  . Asthma   . CML (chronic myelocytic leukemia) ([place])   . COPD (chronic obstructive pulmonary disease) ([place])   . Diabetes mellitus type II, controlled ([place])   . Diabetes mellitus without complication ([place])   . Hypertension   . 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 Lung cancer, upper lobe ([place]) 2009  . Myocardial infarct ([place])   . Small cell lung cancer (H[facility])     Medications:  Has apixaban as outpatient but not taking per med rec.  Also has enoxaparin therapeutic dose as outpatient medication but unknown last dose.  Assessment:  Goal of Therapy:  Heparin level 0.3-0.7 units/ml Monitor platelets by anticoagulation protocol: Yes   Plan:  Baseline PT/INR, aPTT, and anti-Xa ordered. ER RN requesting to start infusion. Will go ahead and start with standard bolus and infusion as ordered by MD due to unknown home med status and labs pending.  F/u aPTT and heparin level 6 hours after start of infusion.  8/11: Heparin level is slightly elevated however APTT within goal range. Will continue current rate. Will recheck heparin level and APTT in 6 hours.   8/11 @ 19:27 :   HL = 0.6,  APTT = 67 Will use HL to guide dosing from here out.  Will draw next HL on 8/12 with AM labs.   Pharmacy to follow per protocol.    Elaf Clauson D 05/18/2016,10:15 PM

## 2016-05-18 NOTE — ED Notes (Signed)
Pt had chemo on Monday Tuesday and Wednesday per wife.

## 2016-05-18 NOTE — ED Notes (Signed)
Pt not tolerating bipap mask.  EDP notified and verbal order given for ativan 0.'5mg'$  IV.

## 2016-05-18 NOTE — ED Notes (Signed)
Report given to [facility] in CCU, awaiting bed approval

## 2016-05-18 NOTE — Progress Notes (Signed)
ANTICOAGULATION CONSULT NOTE -FOLLOW UP   Pharmacy Consult for heparin drip Indication: chest pain/ACS/STEMI  Allergies  Allergen Reactions  . Clarithromycin Nausea And Vomiting  . Penicillins Nausea And Vomiting, Rash and Other (See Comments)    Has patient had a PCN reaction causing immediate rash, facial/tongue/throat swelling, SOB or lightheadedness with hypotension: No Has patient had a PCN reaction causing severe rash involving mucus membranes or skin necrosis: No Has patient had a PCN reaction that required hospitalization No Has patient had a PCN reaction occurring within the last 10 years: No If all of the above answers are "NO", then may proceed with Cephalosporin use.    Patient Measurements: Height: '5\' 9"'$  (175.3 cm) Weight: 189 lb 13.1 oz (86.1 kg) IBW/kg (Calculated) : 70.7 Heparin Dosing Weight: 81.6kg  Vital Signs: Temp: 98 F (36.7 C) (08/11 0944) Temp Source: Oral (08/11 0944) BP: 124/65 (08/11 1400) Pulse Rate: 83 (08/11 1400)  Labs:  Recent Labs  05/18/16 0313 05/18/16 0737 05/18/16 1314  HGB 13.0  --   --   HCT 38.4*  --   --   PLT 219  --   --   APTT 37*  --  69*  LABPROT 13.0  --   --   INR 0.98  --   --   HEPARINUNFRC 0.96*  --  0.80*  CREATININE 0.88  --   --   TROPONINI 0.20* 0.42* 2.11*    Estimated Creatinine Clearance: 80.1 mL/min (by C-G formula based on SCr of 0.88 mg/dL).   Medical History: Past Medical History:  Diagnosis Date  . Asthma   . CML (chronic myelocytic leukemia) ([place])   . COPD (chronic obstructive pulmonary disease) ([place])   . Diabetes mellitus type II, controlled ([place])   . Diabetes mellitus without complication ([place])   . Hypertension   . 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 Lung cancer, upper lobe ([place]) 2009  . Myocardial infarct ([place])   . Small cell lung cancer (HCC)     Medications:  Has apixaban as outpatient but not taking per med rec. Also has enoxaparin therapeutic dose as outpatient medication but  unknown last dose.  Assessment:  Goal of Therapy:  Heparin level 0.3-0.7 units/ml Monitor platelets by anticoagulation protocol: Yes   Plan:  Baseline PT/INR, aPTT, and anti-Xa ordered. ER RN requesting to start infusion. Will go ahead and start with standard bolus and infusion as ordered by MD due to unknown home med status and labs pending.  F/u aPTT and heparin level 6 hours after start of infusion.  8/11: Heparin level is slightly elevated however APTT within goal range. Will continue current rate. Will recheck heparin level and APTT in 6 hours.   Pharmacy to follow per protocol.    Etoy Mcdonnell D 05/18/2016,3:07 PM

## 2016-05-18 NOTE — ED Triage Notes (Signed)
Per EMS, pt was going to drive self in, but was unable to catch breath.  Upon EMS arrival pt was tachypneic with audible wheezing and tachycardiac.  EMS gave '1mg'$  duoneb and '10mg'$  of albuterol.  Pt was on CPAP in EMS until pt felt nauseated.  Pt taken off and put on 4LNC and was 88% on arrival.  Honor Loh, RN gave zofran '4mg'$  upon arrival into [facility] IV.

## 2016-05-18 NOTE — H&P (Signed)
Richard Jimenez is an 75 y.o. male.   Chief Complaint: Shortness of breath HPI: The patient with past medical history of small cell lung cancer as well as COPD presents emergency department due to shortness of breath. He woke in the middle the night telling his wife that he could not breathe. He had been using his inhalers very frequently over the last few days and has had some dyspnea on exertion. He was transported to the emergency department via EMS. He received 1 DuoNeb as well as a continuous albuterol nebulizer treatment en route. Upon arrival patient's oxygen saturations were 88% on 4 L of oxygen via nasal cannula. He was placed on BiPAP due to his wishes to remain DO NOT RESUSCITATE/DO NOT INTUBATE. He received broad spectrum antibiotics as well as Solu-Medrol 125 mg IV prior to the emergency department staff called the hospitalist service for admission.  Past Medical History:  Diagnosis Date  . Asthma   . CML (chronic myelocytic leukemia) ([place])   . COPD (chronic obstructive pulmonary disease) ([place])   . Diabetes mellitus type II, controlled ([place])   . Diabetes mellitus without complication ([place])   . Hypertension   . 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 Lung cancer, upper lobe ([place]) 2009  . Myocardial infarct ([place])   . Small cell lung cancer [facility])     Past Surgical History:  Procedure Laterality Date  . PERIPHERAL VASCULAR CATHETERIZATION N/A 02/21/2016   Procedure: Glori Luis Cath Insertion;  Surgeon: Algernon Huxley, MD;  Location: [place] [facility];  Service: Cardiovascular;  Laterality: N/A;  . PERIPHERAL VASCULAR CATHETERIZATION Right 04/19/2016   Procedure: Thrombectomy/thrombolysis right IJ DVT;  Surgeon: Algernon Huxley, MD;  Location: [place] [facility];  Service: Cardiovascular;  Laterality: Right;  . PORT-A-CATH REMOVAL N/A 04/07/2016   Procedure: REMOVAL PORT-A-CATH;  Surgeon: Serafina Mitchell, MD;  Location: [facility] ORS;  Service: Vascular;  Laterality: N/A;  . THROAT SURGERY    .  TUMOR REMOVAL Right    "years ago-right lower posterior side"    Family History  Problem Relation Age of Onset  . Cancer - Colon Father   . Cancer - Colon Sister    Social History:  reports that he has been smoking Cigarettes.  He has a 62.00 pack-year smoking history. He does not have any smokeless tobacco history on file. He reports that he drinks about 3.6 oz of alcohol per week . He reports that he does not use drugs.  Allergies:  Allergies  Allergen Reactions  . Clarithromycin Nausea And Vomiting  . Penicillins Nausea And Vomiting, Rash and Other (See Comments)    Has patient had a PCN reaction causing immediate rash, facial/tongue/throat swelling, SOB or lightheadedness with hypotension: No Has patient had a PCN reaction causing severe rash involving mucus membranes or skin necrosis: No Has patient had a PCN reaction that required hospitalization No Has patient had a PCN reaction occurring within the last 10 years: No If all of the above answers are "NO", then may proceed with Cephalosporin use.    Prior to Admission medications   Medication Sig Start Date End Date Taking? Authorizing Provider  albuterol (PROAIR HFA) 108 (90 Base) MCG/ACT inhaler Inhale 2 puffs into the lungs every 6 (six) hours as needed for wheezing or shortness of breath.    Yes Historical Provider, MD  albuterol (PROVENTIL) (2.5 MG/3ML) 0.083% nebulizer solution Inhale 3 mLs into the lungs every 6 (six) hours as needed for wheezing or shortness of  breath.    Yes Historical Provider, MD  enoxaparin (LOVENOX) 80 MG/0.8ML injection Inject 0.8 mLs (80 mg total) into the skin every 12 (twelve) hours. 04/20/16  Yes Cammie Sickle, MD  lidocaine-prilocaine (EMLA) cream Apply 1 application topically as needed (prior to accessing port).   Yes Historical Provider, MD  metFORMIN (GLUCOPHAGE) 1000 MG tablet Take 1,000 mg by mouth 2 (two) times daily with a meal.    Yes Historical Provider, MD  apixaban (ELIQUIS) 5 MG  TABS tablet Take 1 tablet (5 mg total) by mouth 2 (two) times daily. 10 mg twice a day for 7 days then 5 mg twice a day for DVT Patient not taking: Reported on 05/18/2016 04/20/16   Bettey Costa, MD  magic mouthwash SOLN Take 5 mLs by mouth 4 (four) times daily as needed for mouth pain. Patient not taking: Reported on 05/18/2016 04/04/16   Cammie Sickle, MD  nicotine (NICODERM CQ - DOSED IN MG/24 HOURS) 14 mg/24hr patch Place 1 patch (14 mg total) onto the skin daily. Patient not taking: Reported on 05/18/2016 04/20/16   Bettey Costa, MD  prochlorperazine (COMPAZINE) 10 MG tablet Take 1 tablet (10 mg total) by mouth every 6 (six) hours as needed for nausea or vomiting. Patient not taking: Reported on 05/18/2016 02/07/16   Cammie Sickle, MD  traMADol (ULTRAM) 50 MG tablet Take 1 tablet (50 mg total) by mouth every 6 (six) hours as needed for moderate pain. Patient not taking: Reported on 05/18/2016 05/02/16   Cammie Sickle, MD     Results for orders placed or performed during the hospital encounter of 05/18/16 (from the past 48 hour(s))  CBC with Differential     Status: Abnormal   Collection Time: 05/18/16  3:13 AM  Result Value Ref Range   WBC 28.2 (H) 3.8 - 10.6 K/uL   RBC 4.40 4.40 - 5.90 MIL/uL   Hemoglobin 13.0 13.0 - 18.0 g/dL   HCT 38.4 (L) 40.0 - 52.0 %   MCV 87.3 80.0 - 100.0 fL   MCH 29.5 26.0 - 34.0 pg   MCHC 33.8 32.0 - 36.0 g/dL   RDW 17.3 (H) 11.5 - 14.5 %   Platelets 219 150 - 440 K/uL   Neutrophils Relative % 86 %   Neutro Abs 24.4 (H) 1.4 - 6.5 K/uL   Lymphocytes Relative 12 %   Lymphs Abs 3.3 1.0 - 3.6 K/uL   Monocytes Relative 1 %   Monocytes Absolute 0.2 0.2 - 1.0 K/uL   Eosinophils Relative 1 %   Eosinophils Absolute 0.2 0 - 0.7 K/uL   Basophils Relative 0 %   Basophils Absolute 0.1 0 - 0.1 K/uL  Brain natriuretic peptide     Status: Abnormal   Collection Time: 05/18/16  3:13 AM  Result Value Ref Range   B Natriuretic Peptide 265.0 (H) 0.0 - 100.0  pg/mL  Troponin I     Status: Abnormal   Collection Time: 05/18/16  3:13 AM  Result Value Ref Range   Troponin I 0.20 (HH) <0.03 ng/mL    Comment: CRITICAL RESULT CALLED TO, READ BACK BY AND VERIFIED WITH IRIS GUIDRY ON 05/18/16 AT 0407 QSD   Comprehensive metabolic panel     Status: Abnormal   Collection Time: 05/18/16  3:13 AM  Result Value Ref Range   Sodium 137 135 - 145 mmol/L   Potassium 4.7 3.5 - 5.1 mmol/L   Chloride 101 101 - 111 mmol/L   CO2 28 22 -  32 mmol/L   Glucose, Bld 248 (H) 65 - 99 mg/dL   BUN 16 6 - 20 mg/dL   Creatinine, Ser 0.88 0.61 - 1.24 mg/dL   Calcium 8.8 (L) 8.9 - 10.3 mg/dL   Total Protein 7.8 6.5 - 8.1 g/dL   Albumin 4.1 3.5 - 5.0 g/dL   AST 36 15 - 41 U/L   ALT 42 17 - 63 U/L   Alkaline Phosphatase 47 38 - 126 U/L   Total Bilirubin 1.1 0.3 - 1.2 mg/dL   GFR calc non Af Amer >60 >60 mL/min   GFR calc Af Amer >60 >60 mL/min    Comment: (NOTE) The eGFR has been calculated using the CKD EPI equation. This calculation has not been validated in all clinical situations. eGFR's persistently <60 mL/min signify possible Chronic Kidney Disease.    Anion gap 8 5 - 15  APTT     Status: Abnormal   Collection Time: 05/18/16  3:13 AM  Result Value Ref Range   aPTT 37 (H) 24 - 36 seconds    Comment:        IF BASELINE aPTT IS ELEVATED, SUGGEST PATIENT RISK ASSESSMENT BE USED TO DETERMINE APPROPRIATE ANTICOAGULANT THERAPY.   Heparin level (unfractionated)     Status: Abnormal   Collection Time: 05/18/16  3:13 AM  Result Value Ref Range   Heparin Unfractionated 0.96 (H) 0.30 - 0.70 IU/mL    Comment:        IF HEPARIN RESULTS ARE BELOW EXPECTED VALUES, AND PATIENT DOSAGE HAS BEEN CONFIRMED, SUGGEST FOLLOW UP TESTING OF ANTITHROMBIN III LEVELS.   Protime-INR     Status: None   Collection Time: 05/18/16  3:13 AM  Result Value Ref Range   Prothrombin Time 13.0 11.4 - 15.2 seconds   INR 0.98   Blood gas, arterial     Status: Abnormal   Collection  Time: 05/18/16  3:25 AM  Result Value Ref Range   FIO2 40.00    Delivery systems BILEVEL POSITIVE AIRWAY PRESSURE    Inspiratory PAP 16    Expiratory PAP 5    pH, Arterial 7.26 (L) 7.350 - 7.450    Comment: CRITICAL RESULT CALLED TO, READ BACK BY AND VERIFIED WITH: DR Jimmye Norman [id number] 05/18/16 ALC    pCO2 arterial 63 (H) 32.0 - 48.0 mmHg    Comment: CRITICAL RESULT CALLED TO, READ BACK BY AND VERIFIED WITH: DR Jimmye Norman [id number] 05/18/16 ALC    pO2, Arterial 104 83.0 - 108.0 mmHg   Bicarbonate 28.3 (H) 21.0 - 28.0 mEq/L   Acid-base deficit 0.3 0.0 - 2.0 mmol/L   O2 Saturation 97.1 %   Patient temperature 37.0    Collection site LEFT RADIAL    Sample type ARTERIAL DRAW    Allens test (pass/fail) PASS PASS   Mechanical Rate 10    Dg Chest Port 1 View  Result Date: 05/18/2016 CLINICAL DATA:  Acute onset of wheezing and tachypnea. Tachycardia. Initial encounter. EXAM: PORTABLE CHEST 1 VIEW COMPARISON:  Chest radiograph performed 12/28/2015, and CT of the chest performed 03/30/2016 FINDINGS: The lungs are well-aerated. Left apical opacity is again seen. This may simply reflect post therapy scarring, though residual or recurrent mass cannot be entirely excluded. Vascular congestion is seen. Increased interstitial markings may reflect mild interstitial edema. No pleural effusion or pneumothorax is seen. The cardiomediastinal silhouette is within normal limits. No acute osseous abnormalities are seen. IMPRESSION: 1. Increased interstitial markings may reflect mild interstitial edema. Vascular congestion noted. 2. Left  apical opacity again noted. This may simply reflect post therapy scarring, though residual or recurrent mass cannot be entirely excluded. This could be assessed on PET/CT, depending on the degree of clinical concern. Electronically Signed   By: Garald Balding M.D.   On: 05/18/2016 04:09    Review of Systems  Unable to perform ROS: Acuity of condition  Respiratory: Positive for shortness of  breath.     Blood pressure 120/75, pulse 89, temperature 98.5 F (36.9 C), temperature source Axillary, resp. rate (!) 26, height '5\' 8"'$  (1.727 m), weight 81.6 kg (180 lb), SpO2 99 %. Physical Exam  Constitutional: He is oriented to person, place, and time. He appears well-developed and well-nourished. He appears distressed.  HENT:  Head: Normocephalic and atraumatic.  Mouth/Throat: Oropharynx is clear and moist.  Eyes: Conjunctivae and EOM are normal. Pupils are equal, round, and reactive to light. No scleral icterus.  Neck: Normal range of motion. Neck supple. No JVD present. No tracheal deviation present. No thyromegaly present.  Cardiovascular: Regular rhythm and normal heart sounds.  Tachycardia present.  Exam reveals no gallop and no friction rub.   No murmur heard. Respiratory: Tachypnea noted. He is in respiratory distress. He has wheezes.  GI: Soft. Bowel sounds are normal. He exhibits distension. There is no tenderness.  Genitourinary:  Genitourinary Comments: Deferred  Musculoskeletal: Normal range of motion. He exhibits no edema.  Lymphadenopathy:    He has no cervical adenopathy.  Neurological: He is alert and oriented to person, place, and time. No cranial nerve deficit.  Skin: Skin is dry. No rash noted. No erythema.  Appears mildly mottled  Psychiatric:  Cannot assess at this time due to acuity of medical condition     Assessment/Plan This is a 75 year old male admitted for acute on chronic respiratory failure with hypoxia and hypercapnia. 1. Respiratory failure: Acute on chronic with hypoxia and hypercapnia. The patient is been placed on BiPAP to improve work of breathing. At the time of this dictation air movement had improved moderately. We will continue frequent albuterol treatments. I have placed the patient on SoluMedrol 60 mg IV which can be tapered as COPD exacerbation improves. He received cefepime and vancomycin in the emergency department but chest x-ray shows  no evidence of pneumonia. I have placed him on azithromycin for anti-inflammatory effect. 2. Elevated troponin: Due to to increased work of breathing; likely represents demand ischemia. Continue to follow. Monitor telemetry. EKG indication of endocardial ischemia. 3. Lung cancer: The patient recently completed his third cycle of chemotherapy. He is status post radiation. No nodules visualized on chest x-ray. Recent PET scan as well as CT scan staging of the chest negative. 4. Diabetes mellitus type 2: Hold metformin for now; sliding scale insulin while hospitalized 5. DVT prophylaxis: Eliquis due to history of venous thrombosis 6. GI prophylaxis: None The patient is a DO NOT RESUSCITATE. Time spent on admission orders and critical patient care approximately 45 minutes  Harrie Foreman, MD 05/18/2016, 7:20 AM

## 2016-05-18 NOTE — Consult Note (Signed)
Cardiology Consultation Note  Patient ID: Richard Jimenez, MRN: [id number], DOB/AGE: 01-21-1941 75 y.o. Admit date: 05/18/2016   Date of Consult: 05/18/2016 Primary Physician: Madelyn Brunner, MD Primary Cardiologist: New to [facility] Requesting Physician: Dr. Ether Griffins, MD  Chief Complaint: Cough/SOB Reason for Consult: Elevated troponin   HPI: 75 y.o. 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 He was found to have acute respiratory distress with hypercapnia and hypoxia in the setting of COPD exacerbation and possible PNA. Troponin trended up to 2.11 this afternoon. He has been asymptomatic. Cardiology has been asked to evaluate given troponin elevation.  He was previously seen by Dr. Nehemiah Massed, MD though has not seen him in many years. He reports having an MI years ago with cardiac cath showing only small vessel disease and medical management being advised. I do not have these results at this time for review. He has been seen in the ED this spring for atypical chest pain felt to be 2/2 MSK. Symptoms resolved on their own without intervention. He has not had any chest pain since then.   He presented to [facility] this morning with increased SOB and cough that developed suddenly overnight. No associated chest pain, diaphoresis, nausea, vomiting, dizziness, presyncope, or syncope. They called EMS who noted the patient to be hypoxic and was placed on oxygen at 4 L. Upon his arrival to [facility] his oxygen saturations were noted to be at 88%. He was placed on BiPAP therapy with improvement in saturations and subjective breathing.   Upon the patient's arrival to [facility] they were found to have troponin 0.20-->0.42-->2.11, WBC 28,000, hgb 13.0, BNP 265, K+ 4.7, SCr 0.88, pH 7.26, pCO2 of 63. ECG showed sinus  tachycardia, 139 bpm, LBBB (known), CXR showed mild interstitial edema with vascular congestion, left apical opacity noted again. He was started on heparin gtt. He has received on full-dose aspirin and metoprolol. He has never had any chest pain. He initially require BiPAP given hypoxia with O2 sats at 88% on 4 L and has since been weaned to 2 L oxygen via nasal cannula. He is on antibiotics per IM as well as inhalers and steroids. Echo is pending.     Past Medical History:  Diagnosis Date  . Asthma   . CML (chronic myelocytic leukemia) ([place])   . COPD (chronic obstructive pulmonary disease) ([place])   . Diabetes mellitus type II, controlled ([place])   . Diabetes mellitus without complication ([place])   . Hypertension   . 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 Lung cancer, upper lobe ([place]) 2009  . Myocardial infarct ([place])   . Small cell lung cancer ([place])       Most Recent Cardiac Studies: Patient reports a prior cardiac cath years ago done by Dr. Nehemiah Massed, MD without intervention needed   Surgical History:  Past Surgical History:  Procedure Laterality Date  . PERIPHERAL VASCULAR CATHETERIZATION N/A 02/21/2016   Procedure: Glori Luis Cath Insertion;  Surgeon: Algernon Huxley, MD;  Location: [place] [facility];  Service: Cardiovascular;  Laterality: N/A;  . PERIPHERAL VASCULAR CATHETERIZATION Right 04/19/2016   Procedure: Thrombectomy/thrombolysis right IJ DVT;  Surgeon: Algernon Huxley, MD;  Location: [place] [facility];  Service: Cardiovascular;  Laterality: Right;  . PORT-A-CATH REMOVAL N/A 04/07/2016   Procedure: REMOVAL PORT-A-CATH;  Surgeon: Serafina Mitchell, MD;  Location: [facility] ORS;  Service: Vascular;  Laterality: N/A;  . THROAT SURGERY    . TUMOR REMOVAL Right    "years ago-right lower posterior side"     Home Meds: Prior to Admission medications   Medication Sig Start Date End Date Taking? Authorizing Provider  albuterol (PROAIR HFA) 108 (90 Base) MCG/ACT inhaler Inhale 2 puffs into the lungs  every 6 (six) hours as needed for wheezing or shortness of breath.    Yes Historical Provider, MD  albuterol (PROVENTIL) (2.5 MG/3ML) 0.083% nebulizer solution Inhale 3 mLs into the lungs every 6 (six) hours as needed for wheezing or shortness of breath.    Yes Historical Provider, MD  enoxaparin (LOVENOX) 80 MG/0.8ML injection Inject 0.8 mLs (80 mg total) into the skin every 12 (twelve) hours. 04/20/16  Yes Cammie Sickle, MD  lidocaine-prilocaine (EMLA) cream Apply 1 application topically as needed (prior to accessing port).   Yes Historical Provider, MD  metFORMIN (GLUCOPHAGE) 1000 MG tablet Take 1,000 mg by mouth 2 (two) times daily with a meal.    Yes Historical Provider, MD  apixaban (ELIQUIS) 5 MG TABS tablet Take 1 tablet (5 mg total) by mouth 2 (two) times daily. 10 mg twice a day for 7 days then 5 mg twice a day for DVT Patient not taking: Reported on 05/18/2016 04/20/16   Bettey Costa, MD  magic mouthwash SOLN Take 5 mLs by mouth 4 (four) times daily as needed for mouth pain. Patient not taking: Reported on 05/18/2016 04/04/16   Cammie Sickle, MD  nicotine (NICODERM CQ - DOSED IN MG/24 HOURS) 14 mg/24hr patch Place 1 patch (14 mg total) onto the skin daily. Patient not taking: Reported on 05/18/2016 04/20/16   Bettey Costa, MD  prochlorperazine (COMPAZINE) 10 MG tablet Take 1 tablet (10 mg total) by mouth every 6 (six) hours as needed for nausea or vomiting. Patient not taking: Reported on 05/18/2016 02/07/16   Cammie Sickle, MD  traMADol (ULTRAM) 50 MG tablet Take 1 tablet (50 mg total) by mouth every 6 (six) hours as needed for moderate pain. Patient not taking: Reported on 05/18/2016 05/02/16   Cammie Sickle, MD    Inpatient Medications:  . antiseptic oral rinse  7 mL Mouth Rinse BID  . aspirin  325 mg Oral Daily  . azithromycin  500 mg Intravenous Q24H  . ceFEPime (MAXIPIME) IV  2 g Intravenous Q8H  . docusate sodium  100 mg Oral BID  . insulin aspart  0-5 Units  Subcutaneous QHS  . insulin aspart  0-9 Units Subcutaneous TID WC  . ipratropium-albuterol  3 mL Nebulization Q4H  . methylPREDNISolone (SOLU-MEDROL) injection  40 mg Intravenous Q12H  . [START ON 05/19/2016] metoprolol succinate  50 mg Oral Daily  . nitroGLYCERIN  0.5 inch Topical Q8H  . sodium chloride flush  3 mL Intravenous Q12H  . tiotropium  18 mcg Inhalation Daily   . heparin 1,000 Units/hr (05/18/16 1000)    Allergies:  Allergies  Allergen Reactions  . Clarithromycin Nausea And Vomiting  . Penicillins Nausea And Vomiting, Rash and Other (See Comments)    Has patient had a PCN reaction causing immediate rash, facial/tongue/throat swelling, SOB or lightheadedness with hypotension: No Has patient had a PCN reaction causing severe rash involving mucus membranes or skin necrosis: No Has patient had a PCN reaction that required hospitalization No Has patient had a PCN reaction occurring within the last 10 years: No If  all of the above answers are "NO", then may proceed with Cephalosporin use.    Social History   Social History  . Marital status: Married    Spouse name: N/A  . Number of children: N/A  . Years of education: N/A   Occupational History  . Not on file.   Social History Main Topics  . Smoking status: Current Every Day Smoker    Packs/day: 1.00    Years: 62.00    Types: Cigarettes  . Smokeless tobacco: Never Used  . Alcohol use 3.6 oz/week    6 Cans of beer per week     Comment: rarely  . Drug use: No  . Sexual activity: Not on file   Other Topics Concern  . Not on file   Social History Narrative  . No narrative on file     Family History  Problem Relation Age of Onset  . Cancer - Colon Father   . Cancer - Colon Sister      Review of Systems: Review of Systems  Constitutional: Positive for malaise/fatigue and weight loss. Negative for chills, diaphoresis and fever.  HENT: Negative for congestion.   Eyes: Negative for discharge and redness.    Respiratory: Positive for cough, sputum production, shortness of breath and wheezing. Negative for hemoptysis.   Cardiovascular: Negative for chest pain, palpitations, orthopnea, claudication, leg swelling and PND.  Gastrointestinal: Negative for abdominal pain, heartburn, nausea and vomiting.  Musculoskeletal: Negative for falls and myalgias.  Skin: Negative for rash.  Neurological: Positive for weakness. Negative for dizziness, tingling, tremors, sensory change, speech change, focal weakness and loss of consciousness.  Endo/Heme/Allergies: Does not bruise/bleed easily.  Psychiatric/Behavioral: Negative for substance abuse. The patient is not nervous/anxious.   All other systems reviewed and are negative.   Labs:  Recent Labs  05/18/16 0313 05/18/16 0737 05/18/16 1314  TROPONINI 0.20* 0.42* 2.11*   Lab Results  Component Value Date   WBC 28.2 (H) 05/18/2016   HGB 13.0 05/18/2016   HCT 38.4 (L) 05/18/2016   MCV 87.3 05/18/2016   PLT 219 05/18/2016     Recent Labs Lab 05/18/16 0313  NA 137  K 4.7  CL 101  CO2 28  BUN 16  CREATININE 0.88  CALCIUM 8.8*  PROT 7.8  BILITOT 1.1  ALKPHOS 47  ALT 42  AST 36  GLUCOSE 248*   No results found for: CHOL, HDL, LDLCALC, TRIG No results found for: DDIMER  Radiology/Studies:  Ct Head W Wo Contrast  Addendum Date: 05/02/2016   ADDENDUM REPORT: 05/02/2016 14:50 ADDENDUM: This examination was completed without being seen because of some sort of technical fault. Comparison is made to the study of 04/18/2016 and multiple previous narrow imaging studies including MRI 01/17/2016. The the brainstem and cerebellum are normal. Incidental mega cisterna magna. There is an old lacunar infarction in the right medial thalamus. Cerebral hemispheres otherwise appear normal. No evidence of mass lesion, hemorrhage, hydrocephalus or extra-axial collection. No abnormal enhancement occurs. Sinuses, middle ears and mastoids are clear. No calvarial  lesion. IMPRESSION: IMPRESSION No evidence of metastatic disease. No change from previous studies. 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 Electronically Signed   By: Nelson Chimes M.D.   On: 05/02/2016 14:50  Result Date: 05/02/2016 EXAM: CT HEAD WITHOUT AND WITH CONTRAST TECHNIQUE: Contiguous axial images were obtained from the base of the skull through the vertex without and with intravenous contrast CONTRAST:  9m ISOVUE-300 IOPAMIDOL (ISOVUE-300) INJECTION 61% COMPARISON:  None. Electronically Signed: By: MElta Guadeloupe  Shogry M.D. On: 05/02/2016 12:26  Dg Chest Port 1 View  Result Date: 05/18/2016 CLINICAL DATA:  Acute onset of wheezing and tachypnea. Tachycardia. Initial encounter. EXAM: PORTABLE CHEST 1 VIEW COMPARISON:  Chest radiograph performed 12/28/2015, and CT of the chest performed 03/30/2016 FINDINGS: The lungs are well-aerated. Left apical opacity is again seen. This may simply reflect post therapy scarring, though residual or recurrent mass cannot be entirely excluded. Vascular congestion is seen. Increased interstitial markings may reflect mild interstitial edema. No pleural effusion or pneumothorax is seen. The cardiomediastinal silhouette is within normal limits. No acute osseous abnormalities are seen. IMPRESSION: 1. Increased interstitial markings may reflect mild interstitial edema. Vascular congestion noted. 2. Left apical opacity again noted. This may simply reflect post therapy scarring, though residual or recurrent mass cannot be entirely excluded. This could be assessed on PET/CT, depending on the degree of clinical concern. Electronically Signed   By: Garald Balding M.D.   On: 05/18/2016 04:09    EKG: Interpreted by me showed: sinus tachycardia, 139 bpm, LBBB Telemetry: Interpreted by me showed: sinus tachycardia, low 100's, currently sinus rhythm 80's, LBBB  Weights: Filed Weights   05/18/16 0315 05/18/16 0944  Weight: 180 lb (81.6 kg) 189 lb 13.1 oz (86.1 kg)     Physical  Exam: Blood pressure (!) 120/59, pulse 83, temperature 98.1 F (36.7 C), temperature source Oral, resp. rate (!) 26, height '5\' 9"'$  (1.753 m), weight 189 lb 13.1 oz (86.1 kg), SpO2 100 %. Body mass index is 28.03 kg/m. General: Well developed, well nourished, in no acute distress. Head: Normocephalic, atraumatic, sclera non-icteric, no xanthomas, nares are without discharge.  Neck: Negative for carotid bruits. JVD not elevated. Lungs: Coarse breath sounds bilaterally with expiratory wheezing. Breathing is unlabored. Heart: RRR with S1 S2. No murmurs, rubs, or gallops appreciated. Abdomen: Soft, non-tender, non-distended with normoactive bowel sounds. No hepatomegaly. No rebound/guarding. No obvious abdominal masses. Msk:  Strength and tone appear normal for age. Extremities: No clubbing or cyanosis. No edema. Distal pedal pulses are 2+ and equal bilaterally. Neuro: Alert and oriented X 3. No facial asymmetry. No focal deficit. Moves all extremities spontaneously. Psych:  Responds to questions appropriately with a normal affect.    Assessment and Plan:  Principal Problem:   Acute respiratory failure ([place]) Active Problems:   Lung cancer, upper lobe (HCC)   COPD exacerbation (H[facility])   NSTEMI (non-ST elevated myocardial infarction) ([place])   Jugular vein thrombosis, right    1. NSTEMI: -Troponin currently up to 2.11 this afternoon, continue to cycle until level peaks -Never with chest pain, SOB much improved on 2 L via [place] at this time -Possibly in the setting of the patient's acute respiratory failure 2/2 COPD exacerbation/possible PNA -Unable to lay completely supine at this time -On heparin gtt already given persistent right jugular DVT -Continue aspirin and metoprolol  -As breathing continues to improve this weekend into the first of the week he will need ischemia evaluation with cardiac catheterization  -Check echo to evaluate LV systolic function, wall motion, and right-sided  pressure -Continue nitro paste and oxygen -Maintain a chest pain free status, if he worsens he could require transfer to [facility] for evaluation and treatment  2. Acute respiratory failure with hypoxia and hypercapnia: -Initially required BiPAP to maintain oxygen saturations, now weaned to 2 L via nasal cannula -Likely in the setting of COPD exacerbation and possible PNA -Cannot rule out CHF exacerbation at this time playing a role as well -Wean oxygen as able  throughout the weekend  3. COPD exacerbation/possible PNA/lung cancer: -qSOFA of 1 (RR), does not meet sepsis criteria at this time -On ABX, inhalers, and steroids per IM -Culture pending -Status post carboplatin chemo  4. Persistent right jugular DVT: -On Lovenox at home and heparin gtt at this time -Patient cannot afford Lovenox moving forward after this admission -Has previously been on DOAC -Pending the above cardiac work up consider change in therapy from a finance standpoint  5. DM2: -Per IM  6. HTN: -Monitor antihypertensives in acute illness -No current signs of shock   Signed, Christell Faith, PA-C [facility] Pager: [phone] 05/18/2016, 4:32 PM

## 2016-05-18 NOTE — Care Management (Signed)
Patient just received from ED with respiratory problems. O2 is acute. I met with patient's wife and daughter. Patient in on Lovenox at home which they received for free through medication assistance Dr. Aldean Ast. However, his other medications are not free. Unfortunately, he elected to not have drug coverage when he enrolled with Medicare. There is little assistance available based on Medicare status. RNCM to continue to follow for discharge needs. He is from home with his wife where she states he was independent.

## 2016-05-18 NOTE — Progress Notes (Signed)
Pharmacy Antibiotic Note  JOSHAU CODE is a 75 y.o. male admitted on 05/18/2016 with sepsis.  Pharmacy has been consulted for cefepime dosing.  Plan: Cefepime 2 grams q 8 hours ordered.  Height: '5\' 8"'$  (172.7 cm) Weight: 180 lb (81.6 kg) IBW/kg (Calculated) : 68.4  Temp (24hrs), Avg:98.5 F (36.9 C), Min:98.5 F (36.9 C), Max:98.5 F (36.9 C)   Recent Labs Lab 05/14/16 0940 05/18/16 0313  WBC 5.5 28.2*  CREATININE 0.78 0.88    Estimated Creatinine Clearance: 71.3 mL/min (by C-G formula based on SCr of 0.88 mg/dL).    Allergies  Allergen Reactions  . Clarithromycin Nausea And Vomiting  . Penicillins Nausea And Vomiting, Rash and Other (See Comments)    Has patient had a PCN reaction causing immediate rash, facial/tongue/throat swelling, SOB or lightheadedness with hypotension: No Has patient had a PCN reaction causing severe rash involving mucus membranes or skin necrosis: No Has patient had a PCN reaction that required hospitalization No Has patient had a PCN reaction occurring within the last 10 years: No If all of the above answers are "NO", then may proceed with Cephalosporin use.    Antimicrobials this admission: cefepime  >>    >>   Dose adjustments this admission:   Microbiology results: 8/11 BCx: pending   Thank you for allowing pharmacy to be a part of this patient's care.  Kensley Valladares S 05/18/2016 5:37 AM

## 2016-05-18 NOTE — ED Notes (Signed)
Pt resting in bed, family at bedside, pt tolerating BIPAP at this time

## 2016-05-18 NOTE — Progress Notes (Signed)
After further assessment and discussion of patient's  status and medical condition during multidisciplinary rounds, the plan is outlined as below  1.solumedrol 40 bid 2.dounebs every 4 hrs 3.start spiriva       Corrin Parker, M.D.  [facility]  Medical Director [place] Director [place] Department

## 2016-05-18 NOTE — Progress Notes (Signed)
Richard Jimenez at [place] NAME: Richard Jimenez    MR#:  [id number]  DATE OF BIRTH:  1941-03-14  SUBJECTIVE:  CHIEF COMPLAINT:   Chief Complaint  Patient presents with  . Respiratory Distress  The patient is 75 year old Caucasian male with history of COPD, small cell lung cancer, who presents to the hospital with complaints of severe shortness of breath, which is going on for the past few days, wheezing, but no significant phlegm production or cough. On arrival to emergency room, he was noted to have O2 sats at 88% on 4 L, he was placed on BiPAP, now he is asleep, unable to review systems, he seems to be more comfortable now after BiPAP was initiated, per family members.  Review of Systems  Unable to perform ROS: Critical illness    VITAL SIGNS: Blood pressure 119/62, pulse 83, temperature 98 F (36.7 C), temperature source Oral, resp. rate (!) 32, height '5\' 9"'$  (1.753 m), weight 86.1 kg (189 lb 13.1 oz), SpO2 100 %.  PHYSICAL EXAMINATION:   GENERAL:  75 y.o.-year-old patient lying in the bed in mild to moderate respiratory distress, now on BiPAP.  EYES: Pupils equal, round, reactive to light and accommodation. No scleral icterus. Extraocular muscles intact.  HEENT: Head atraumatic, normocephalic. Oropharynx and nasopharynx clear.  NECK:  Supple, no jugular venous distention. No thyroid enlargement, no tenderness.  LUNGS: Diminished breath sounds bilaterally, bilateral wheezing, few rales,rhonchi , but no crepitations . Intermittent use of accessory muscles of respiration.  CARDIOVASCULAR: S1, S2 normal. No murmurs, rubs, or gallops.  ABDOMEN: Soft, nontender, nondistended. Bowel sounds present. No organomegaly or mass.  EXTREMITIES: No pedal edema, cyanosis, or clubbing.  NEUROLOGIC: Cranial nerves II through XII are intact. Muscle strength 5/5 in all extremities. Sensation intact. Gait not checked.  PSYCHIATRIC: The patient is asleep  SKIN:  No obvious rash, lesion, or ulcer.   ORDERS/RESULTS REVIEWED:   CBC  Recent Labs Lab 05/14/16 0940 05/18/16 0313  WBC 5.5 28.2*  HGB 13.5 13.0  HCT 39.8* 38.4*  PLT 216 219  MCV 85.2 87.3  MCH 28.9 29.5  MCHC 33.9 33.8  RDW 17.2* 17.3*  LYMPHSABS 1.5 3.3  MONOABS 0.5 0.2  EOSABS 0.1 0.2  BASOSABS 0.1 0.1   ------------------------------------------------------------------------------------------------------------------  Chemistries   Recent Labs Lab 05/14/16 0940 05/18/16 0313  NA 132* 137  K 4.1 4.7  CL 99* 101  CO2 27 28  GLUCOSE 128* 248*  BUN 10 16  CREATININE 0.78 0.88  CALCIUM 8.9 8.8*  AST 19 36  ALT 20 42  ALKPHOS 47 47  BILITOT 0.5 1.1   ------------------------------------------------------------------------------------------------------------------ estimated creatinine clearance is 80.1 mL/min (by C-G formula based on SCr of 0.88 mg/dL). ------------------------------------------------------------------------------------------------------------------ No results for input(s): TSH, T4TOTAL, T3FREE, THYROIDAB in the last 72 hours.  Invalid input(s): FREET3  Cardiac Enzymes  Recent Labs Lab 05/18/16 0313 05/18/16 0737  TROPONINI 0.20* 0.42*   ------------------------------------------------------------------------------------------------------------------ Invalid input(s): POCBNP ---------------------------------------------------------------------------------------------------------------  RADIOLOGY: Dg Chest Port 1 View  Result Date: 05/18/2016 CLINICAL DATA:  Acute onset of wheezing and tachypnea. Tachycardia. Initial encounter. EXAM: PORTABLE CHEST 1 VIEW COMPARISON:  Chest radiograph performed 12/28/2015, and CT of the chest performed 03/30/2016 FINDINGS: The lungs are well-aerated. Left apical opacity is again seen. This may simply reflect post therapy scarring, though residual or recurrent mass cannot be entirely excluded. Vascular  congestion is seen. Increased interstitial markings may reflect mild interstitial edema. No pleural effusion or pneumothorax is seen. The  cardiomediastinal silhouette is within normal limits. No acute osseous abnormalities are seen. IMPRESSION: 1. Increased interstitial markings may reflect mild interstitial edema. Vascular congestion noted. 2. Left apical opacity again noted. This may simply reflect post therapy scarring, though residual or recurrent mass cannot be entirely excluded. This could be assessed on PET/CT, depending on the degree of clinical concern. Electronically Signed   By: Garald Balding M.D.   On: 05/18/2016 04:09    EKG:  Orders placed or performed during the hospital encounter of 05/18/16  . ED EKG  . ED EKG    ASSESSMENT AND PLAN:  Active Problems:   Acute on chronic respiratory failure with hypoxia and hypercapnia (HCC)  #1. Acute on chronic respiratory failure with hypoxia and hypercapnia, continue patient on steroids, nebulizing therapy, BiPAP, pulmonary consultation is requested, patient clinically improved , per family members, follow clinically #2. COPD exacerbation, steroids, and ablation therapy, nebulizers, oxygen therapy, keeping pulse oximetry at 88-92%, patient is DO NOT RESUSCITATE #3. Acute bronchitis versus pneumonia in left apical area, continue vancomycin, cefepime and Zithromax, get sputum cultures if possible, blood cultures were taken, negative so far, MRSA, PCR is pending #4 . Elevated troponin, likely demand ischemia, follow cardiac enzymes 3, get echo, continue heparin intravenously, initiate low-dose of metoprolol #5. Leukocytosis, likely due to stress   Management plans discussed with the patient, family and they are in agreement.   DRUG ALLERGIES:  Allergies  Allergen Reactions  . Clarithromycin Nausea And Vomiting  . Penicillins Nausea And Vomiting, Rash and Other (See Comments)    Has patient had a PCN reaction causing immediate rash,  facial/tongue/throat swelling, SOB or lightheadedness with hypotension: No Has patient had a PCN reaction causing severe rash involving mucus membranes or skin necrosis: No Has patient had a PCN reaction that required hospitalization No Has patient had a PCN reaction occurring within the last 10 years: No If all of the above answers are "NO", then may proceed with Cephalosporin use.    CODE STATUS:     Code Status Orders        Start     Ordered   05/18/16 0708  Do not attempt resuscitation (DNR)  Continuous    Question Answer Comment  In the event of cardiac or respiratory ARREST Do not call a "code blue"   In the event of cardiac or respiratory ARREST Do not perform Intubation, CPR, defibrillation or ACLS   In the event of cardiac or respiratory ARREST Use medication by any route, position, wound care, and other measures to relive pain and suffering. May use oxygen, suction and manual treatment of airway obstruction as needed for comfort.      05/18/16 0707    Code Status History    Date Active Date Inactive Code Status Order ID Comments User Context   05/18/2016  4:12 AM 05/18/2016  7:07 AM DNR [id number]  Earleen Newport, MD ED   04/18/2016  5:35 PM 04/20/2016  2:33 PM Partial Code [id number]  Demetrios Loll, MD Inpatient   04/18/2016  5:23 PM 04/18/2016  5:35 PM Full Code [id number]  Demetrios Loll, MD Inpatient   04/06/2016  9:33 PM 04/09/2016  5:34 PM Full Code [id number]  Theodoro Grist, MD Inpatient      TOTAL TIME TAKING CARE OF THIS PATIENT: 40 minutes.  Discussed with patient's family, all questions answered  Selah Klang M.D on 05/18/2016 at 11:44 AM  Between 7am to 6pm - Pager - [phone]  After 6pm go to [email or web address] -  password [email or web address] [facility]  [facility]  Office  [phone]  CC: Primary care physician; Madelyn Brunner, MD

## 2016-05-18 NOTE — ED Notes (Signed)
Pt unable to tolerate being off bipap.  Pt remained tachypneic with labored breathing and accessory muscle use.  Pt placed back on bipap and administered ativan to help pt to relax at this time.

## 2016-05-19 LAB — CBC
HCT: 33.1 % — ABNORMAL LOW (ref 40.0–52.0)
HEMOGLOBIN: 11.4 g/dL — AB (ref 13.0–18.0)
MCH: 29.6 pg (ref 26.0–34.0)
MCHC: 34.4 g/dL (ref 32.0–36.0)
MCV: 86.1 fL (ref 80.0–100.0)
Platelets: 150 10*3/uL (ref 150–440)
RBC: 3.84 MIL/uL — ABNORMAL LOW (ref 4.40–5.90)
RDW: 17 % — ABNORMAL HIGH (ref 11.5–14.5)
WBC: 22.2 10*3/uL — ABNORMAL HIGH (ref 3.8–10.6)

## 2016-05-19 LAB — GLUCOSE, CAPILLARY
Glucose-Capillary: 198 mg/dL — ABNORMAL HIGH (ref 65–99)
Glucose-Capillary: 186 mg/dL — ABNORMAL HIGH (ref 65–99)
Glucose-Capillary: 220 mg/dL — ABNORMAL HIGH (ref 65–99)
Glucose-Capillary: 270 mg/dL — ABNORMAL HIGH (ref 65–99)

## 2016-05-19 LAB — HEPARIN LEVEL (UNFRACTIONATED): Heparin Unfractionated: 0.47 IU/mL (ref 0.30–0.70)

## 2016-05-19 MED ORDER — METHYLPREDNISOLONE SODIUM SUCC 40 MG IJ SOLR
40.0000 mg | Freq: Four times a day (QID) | INTRAMUSCULAR | Status: DC
  Administered 2016-05-19 – 2016-05-21 (×8): 40 mg via INTRAVENOUS
  Filled 2016-05-19 (×8): qty 1

## 2016-05-19 MED ORDER — BUDESONIDE 0.25 MG/2ML IN SUSP
0.2500 mg | Freq: Two times a day (BID) | RESPIRATORY_TRACT | Status: DC
  Administered 2016-05-21: 0.25 mg via RESPIRATORY_TRACT
  Filled 2016-05-19 (×3): qty 2

## 2016-05-19 MED ORDER — NICOTINE 21 MG/24HR TD PT24
21.0000 mg | MEDICATED_PATCH | Freq: Every day | TRANSDERMAL | Status: DC
  Administered 2016-05-19 – 2016-05-23 (×5): 21 mg via TRANSDERMAL
  Filled 2016-05-19 (×5): qty 1

## 2016-05-19 NOTE — Progress Notes (Signed)
    Subjective:  Still short of breath. No chest pain. Feeling a little bit better and tolerating oxygen by nasal cannula now.  Objective:  Vital Signs in the last 24 hours: Temp:  [97.9 F (36.6 C)-98.3 F (36.8 C)] 97.9 F (36.6 C) (08/12 0800) Pulse Rate:  [74-103] 96 (08/12 1300) Resp:  [18-41] 29 (08/12 1300) BP: (106-143)/(55-82) 143/76 (08/12 1300) SpO2:  [93 %-100 %] 98 % (08/12 1300) FiO2 (%):  [28 %-30 %] 28 % (08/12 1135)  Intake/Output from previous day: 08/11 0701 - 08/12 0700 In: 1064.7 [P.O.:460; I.V.:254.7; IV Piggyback:350] Out: 1525 [Urine:1525]  Physical Exam: Pt is alert and oriented, elderly male in NAD HEENT: normal Neck: JVP - normal Lungs: Poor air movement bilaterally CV: RRR with distant heart sounds Abd: soft, NT, Positive BS, no hepatomegaly Ext: no C/C/E Skin: warm/dry no rash   Lab Results:  Recent Labs  05/18/16 0313 05/19/16 0325  WBC 28.2* 22.2*  HGB 13.0 11.4*  PLT 219 150    Recent Labs  05/18/16 0313  NA 137  K 4.7  CL 101  CO2 28  GLUCOSE 248*  BUN 16  CREATININE 0.88    Recent Labs  05/18/16 1314 05/18/16 1927  TROPONINI 2.11* 2.08*    Cardiac Studies: 2-D echocardiogram 05/18/2016: Study Conclusions  - Left ventricle: The cavity size was mildly dilated. There was   mild concentric hypertrophy. Systolic function was moderately   reduced. The estimated ejection fraction was in the range of 35%   to 40%. Regional wall motion abnormalities cannot be excluded. - Aortic valve: There was mild stenosis. Mean gradient (S): 12 mm   Hg. Valve area (Vmax): 1.71 cm^2. - Mitral valve: There was mild regurgitation. - Left atrium: The atrium was moderately dilated. - Pulmonary arteries: Systolic pressure could not be accurately   estimated. - Inferior vena cava: The vessel was dilated. The respirophasic   diameter changes were blunted (< 50%), consistent with elevated   central venous  pressure.  Impressions:  - challenging image quality.  Tele: Normal sinus rhythm  Assessment/Plan:  1. Elevated troponin: Non-STEMI versus demand ischemia  2. Acute on chronic respiratory failure, improving  3. Leukocytosis  4. 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 DO NOT RESUSCITATE  Discussed situation with the patient and his daughter. His respiratory status seems tenuous and his lung exam is impressive for very poor air movement. I suspect his troponin elevation is related to demand ischemia rather than true ACS. While his LV dysfunction is concerning, I'm not sure that the risks of invasive cardiac evaluation are outweighed by potential benefit and might be best to get him through his acute illness before considering further cardiac testing. Will follow-up tomorrow on his overall clinical course but the patient seems inclined toward a more conservative approach as well. His daughter is present for this discussion and is in agreement. From my perspective, it is okay to discontinue heparin tomorrow unless it is indicated for DVT treatment.  Sherren Mocha, M.D. 05/19/2016, 2:32 PM

## 2016-05-19 NOTE — Progress Notes (Signed)
[facility] [facility] [place] NAME: Richard Jimenez    MR#:  [id number]  DATE OF BIRTH:  1940/12/17  SUBJECTIVE:   Here due to shortness of breath secondary to COPD exacerbation. Improved and now off BiPAP. Shortness of breath improved but still having some wheezing.    REVIEW OF SYSTEMS:    Review of Systems  Constitutional: Negative for chills and fever.  HENT: Negative for congestion and tinnitus.   Eyes: Negative for blurred vision and double vision.  Respiratory: Positive for shortness of breath and wheezing. Negative for cough.   Cardiovascular: Negative for chest pain, orthopnea and PND.  Gastrointestinal: Negative for abdominal pain, diarrhea, nausea and vomiting.  Genitourinary: Negative for dysuria and hematuria.  Neurological: Negative for dizziness, sensory change and focal weakness.  All other systems reviewed and are negative.   Nutrition: Heart Healthy/Carb control Tolerating Diet: Yes Tolerating PT: Await Eval.   DRUG ALLERGIES:   Allergies  Allergen Reactions  . Clarithromycin Nausea And Vomiting  . Penicillins Nausea And Vomiting, Rash and Other (See Comments)    Has patient had a PCN reaction causing immediate rash, facial/tongue/throat swelling, SOB or lightheadedness with hypotension: No Has patient had a PCN reaction causing severe rash involving mucus membranes or skin necrosis: No Has patient had a PCN reaction that required hospitalization No Has patient had a PCN reaction occurring within the last 10 years: No If all of the above answers are "NO", then may proceed with Cephalosporin use.    VITALS:  Blood pressure 138/75, pulse 94, temperature 98 F (36.7 C), temperature source Oral, resp. rate (!) 23, height '5\' 9"'$  (1.753 m), weight 86.1 kg (189 lb 13.1 oz), SpO2 97 %.  PHYSICAL EXAMINATION:   Physical Exam  GENERAL:  75 y.o.-year-old patient lying in bed in mild resp. Distress.  EYES: Pupils equal, round,  reactive to light and accommodation. No scleral icterus. Extraocular muscles intact.  HEENT: Head atraumatic, normocephalic. Oropharynx and nasopharynx clear.  NECK:  Supple, no jugular venous distention. No thyroid enlargement, no tenderness.  LUNGS: Prolonged insp & Exp phase.  Diffuse wheezing b/l. No use of accessory muscles of respiration. No rales, rhonchi CARDIOVASCULAR: S1, S2 normal. No murmurs, rubs, or gallops.  ABDOMEN: Soft, nontender, nondistended. Bowel sounds present. No organomegaly or mass.  EXTREMITIES: No cyanosis, clubbing or edema b/l.    NEUROLOGIC: Cranial nerves II through XII are intact. No focal Motor or sensory deficits b/l.   PSYCHIATRIC: The patient is alert and oriented x 3.  SKIN: No obvious rash, lesion, or ulcer.    LABORATORY PANEL:   CBC  Recent Labs Lab 05/19/16 0325  WBC 22.2*  HGB 11.4*  HCT 33.1*  PLT 150   ------------------------------------------------------------------------------------------------------------------  Chemistries   Recent Labs Lab 05/18/16 0313  NA 137  K 4.7  CL 101  CO2 28  GLUCOSE 248*  BUN 16  CREATININE 0.88  CALCIUM 8.8*  AST 36  ALT 42  ALKPHOS 47  BILITOT 1.1   ------------------------------------------------------------------------------------------------------------------  Cardiac Enzymes  Recent Labs Lab 05/18/16 1927  TROPONINI 2.08*   ------------------------------------------------------------------------------------------------------------------  RADIOLOGY:  Dg Chest Port 1 View  Result Date: 05/18/2016 CLINICAL DATA:  Acute onset of wheezing and tachypnea. Tachycardia. Initial encounter. EXAM: PORTABLE CHEST 1 VIEW COMPARISON:  Chest radiograph performed 12/28/2015, and CT of the chest performed 03/30/2016 FINDINGS: The lungs are well-aerated. Left apical opacity is again seen. This may simply reflect post therapy scarring, though residual or recurrent mass  cannot be entirely  excluded. Vascular congestion is seen. Increased interstitial markings may reflect mild interstitial edema. No pleural effusion or pneumothorax is seen. The cardiomediastinal silhouette is within normal limits. No acute osseous abnormalities are seen. IMPRESSION: 1. Increased interstitial markings may reflect mild interstitial edema. Vascular congestion noted. 2. Left apical opacity again noted. This may simply reflect post therapy scarring, though residual or recurrent mass cannot be entirely excluded. This could be assessed on PET/CT, depending on the degree of clinical concern. Electronically Signed   By: Garald Balding M.D.   On: 05/18/2016 04:09     ASSESSMENT AND PLAN:   75 year old male with past History of COPD, small cell lung cancer who presents to the hospital due to severe shortness of breath noted to be in COPD exacerbation.  1. Acute on chronic respiratory failure with hypoxia-secondary to COPD exacerbation. -Improved and now off BiPAP. Continue IV steroids, Pulmicort nebs, scheduled DuoNeb's. -Continue empiric antibiotics with cefepime and Zithromax.  2. COPD exacerbation-still cause of patient's acute respiratory failure. -Continue IV steroids, scheduled DuoNeb's, Pulmicort nebs.  - cont. Empiric IV Cefepime, Zithromax.   3. Elevated Troponin - demand ischemia (vs) NSTEMI.  Most likely supply demand ischemia from COPD Exacerbation and hypoxia.  - Echo showing some LV dysfunction but no plans for acute intervention as per Cardiology.  -Continue aspirin, atorvastatin, beta blocker.  4. Diabetes type 2 without complication-continue sliding scale insulin. Follow blood sugars.  5. Leukocytosis-suspect steroid mediated, we'll follow with IV antibiotic therapy.  6. Essential hypertension-continue Toprol.   All the records are reviewed and case discussed with Care Management/Social Workerr. Management plans discussed with the patient, family and they are in agreement.  CODE  STATUS: Full  DVT Prophylaxis: Heparin drip  TOTAL TIME TAKING CARE OF THIS PATIENT: 30 minutes.   POSSIBLE D/C IN 2-3 DAYS, DEPENDING ON CLINICAL CONDITION.   Henreitta Leber M.D on 05/19/2016 at 2:38 PM  Between 7am to 6pm - Pager - [phone]  After 6pm go to [email or web address] - password [email or web address] [facility]  Office  [phone]  CC: Primary care physician; Madelyn Brunner, MD

## 2016-05-19 NOTE — Progress Notes (Signed)
Pt. Requested a cigarette to smoke it was explained to him he could not smoke, but I could request a nicotine patch. He agreed. Wife stated pt. Was chain smoking prior to admission.  Dr. Lavetta Nielsen ordered nicotine patch '21mg'$ . Will continue to monitor pt.

## 2016-05-19 NOTE — Progress Notes (Signed)
Pt. Complained he was short of breath, he was lying on his side got him turned on his back and pulled up in bed,  he continues to have expiratory wheezes, sats 97 on 3LNC. I asked him if he would wear his bi-pap to ease his breathing, he said yes, requested respiratory placed bi-pap on pt. He is tolerating well. Will continue to monitor pt.

## 2016-05-19 NOTE — Care Management Important Message (Signed)
Important Message  Patient Details  Name: AZHAR KNOPE MRN: [id number] Date of Birth: 1940/10/23   Medicare Important Message Given:  Yes    Shakenya Stoneberg A, RN 05/19/2016, 3:49 PM

## 2016-05-19 NOTE — Progress Notes (Addendum)
Pt has remained alert and oriented x4 with no c/o pain. Pt has been off bi-pap since around 0500 on [facility] sating > 90%. RR even and unlabored. Bilateral rhonchi and prominent left sided expiratory wheezes upon auscultation. 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 Pt with orders to transfer to floor.

## 2016-05-19 NOTE — Progress Notes (Signed)
ANTICOAGULATION CONSULT NOTE -FOLLOW UP   Pharmacy Consult for heparin drip Indication: chest pain/ACS/STEMI  Allergies  Allergen Reactions  . Clarithromycin Nausea And Vomiting  . Penicillins Nausea And Vomiting, Rash and Other (See Comments)    Has patient had a PCN reaction causing immediate rash, facial/tongue/throat swelling, SOB or lightheadedness with hypotension: No Has patient had a PCN reaction causing severe rash involving mucus membranes or skin necrosis: No Has patient had a PCN reaction that required hospitalization No Has patient had a PCN reaction occurring within the last 10 years: No If all of the above answers are "NO", then may proceed with Cephalosporin use.    Patient Measurements: Height: '5\' 9"'$  (175.3 cm) Weight: 189 lb 13.1 oz (86.1 kg) IBW/kg (Calculated) : 70.7 Heparin Dosing Weight: 81.6kg  Vital Signs: Temp: 98.3 F (36.8 C) (08/11 2000) Temp Source: Oral (08/11 2000) BP: 130/62 (08/12 0000) Pulse Rate: 77 (08/12 0000)  Labs:  Recent Labs  05/18/16 0313 05/18/16 0737 05/18/16 1314 05/18/16 1927 05/19/16 0325  HGB 13.0  --   --   --   --   HCT 38.4*  --   --   --   --   PLT 219  --   --   --   --   APTT 37*  --  69* 67*  --   LABPROT 13.0  --   --   --   --   INR 0.98  --   --   --   --   HEPARINUNFRC 0.96*  --  0.80* 0.60 0.47  CREATININE 0.88  --   --   --   --   TROPONINI 0.20* 0.42* 2.11* 2.08*  --     Estimated Creatinine Clearance: 80.1 mL/min (by C-G formula based on SCr of 0.88 mg/dL).   Medical History: Past Medical History:  Diagnosis Date  . Asthma   . CML (chronic myelocytic leukemia) ([place])   . COPD (chronic obstructive pulmonary disease) ([place])   . Diabetes mellitus type II, controlled ([place])   . Diabetes mellitus without complication ([place])   . Hypertension   . 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 Lung cancer, upper lobe ([place]) 2009  . Myocardial infarct ([place])   . Small cell lung cancer (HCC)     Medications:   Has apixaban as outpatient but not taking per med rec. Also has enoxaparin therapeutic dose as outpatient medication but unknown last dose.  Assessment:  Goal of Therapy:  Heparin level 0.3-0.7 units/ml Monitor platelets by anticoagulation protocol: Yes   Plan:  Baseline PT/INR, aPTT, and anti-Xa ordered. ER RN requesting to start infusion. Will go ahead and start with standard bolus and infusion as ordered by MD due to unknown home med status and labs pending.  F/u aPTT and heparin level 6 hours after start of infusion.  8/11: Heparin level is slightly elevated however APTT within goal range. Will continue current rate. Will recheck heparin level and APTT in 6 hours.   8/11 @ 19:27 :   HL = 0.6,  APTT = 67 Will use HL to guide dosing from here out.  Will draw next HL on 8/12 with AM labs.   8/12 AM heparin level 0.47. Continue current regimen. Recheck heparin level and CBC with tomorrow AM labs.  Pharmacy to follow per protocol.    Keiston Manley S 05/19/2016,5:41 AM

## 2016-05-20 ENCOUNTER — Inpatient Hospital Stay: Payer: Medicare Other

## 2016-05-20 LAB — BASIC METABOLIC PANEL
Anion gap: 6 (ref 5–15)
BUN: 28 mg/dL — AB (ref 6–20)
CO2: 33 mmol/L — AB (ref 22–32)
Calcium: 8.6 mg/dL — ABNORMAL LOW (ref 8.9–10.3)
Chloride: 99 mmol/L — ABNORMAL LOW (ref 101–111)
Creatinine, Ser: 0.89 mg/dL (ref 0.61–1.24)
GFR calc Af Amer: >60 mL/min (ref >60)
GLUCOSE: 211 mg/dL — AB (ref 65–99)
POTASSIUM: 5.1 mmol/L (ref 3.5–5.1)
Sodium: 138 mmol/L (ref 135–145)

## 2016-05-20 LAB — GLUCOSE, CAPILLARY
Glucose-Capillary: 216 mg/dL — ABNORMAL HIGH (ref 65–99)
Glucose-Capillary: 237 mg/dL — ABNORMAL HIGH (ref 65–99)
Glucose-Capillary: 204 mg/dL — ABNORMAL HIGH (ref 65–99)
Glucose-Capillary: 265 mg/dL — ABNORMAL HIGH (ref 65–99)

## 2016-05-20 LAB — CBC
HEMATOCRIT: 32.9 % — AB (ref 40.0–52.0)
Hemoglobin: 11.1 g/dL — ABNORMAL LOW (ref 13.0–18.0)
MCH: 29.5 pg (ref 26.0–34.0)
MCHC: 33.8 g/dL (ref 32.0–36.0)
MCV: 87.1 fL (ref 80.0–100.0)
PLATELETS: 146 10*3/uL — AB (ref 150–440)
RBC: 3.78 MIL/uL — ABNORMAL LOW (ref 4.40–5.90)
RDW: 17.5 % — AB (ref 11.5–14.5)
WBC: 21.3 10*3/uL — ABNORMAL HIGH (ref 3.8–10.6)

## 2016-05-20 LAB — HEPARIN LEVEL (UNFRACTIONATED): Heparin Unfractionated: 0.2 IU/mL — ABNORMAL LOW (ref 0.30–0.70)

## 2016-05-20 MED ORDER — LOSARTAN POTASSIUM 25 MG PO TABS
25.0000 mg | ORAL_TABLET | Freq: Every day | ORAL | Status: DC
  Administered 2016-05-20 – 2016-05-23 (×4): 25 mg via ORAL
  Filled 2016-05-20 (×4): qty 1

## 2016-05-20 MED ORDER — LEVOFLOXACIN IN D5W 500 MG/100ML IV SOLN
500.0000 mg | INTRAVENOUS | Status: DC
  Administered 2016-05-20 – 2016-05-22 (×3): 500 mg via INTRAVENOUS
  Filled 2016-05-20 (×4): qty 100

## 2016-05-20 MED ORDER — ASPIRIN 81 MG PO CHEW
81.0000 mg | CHEWABLE_TABLET | Freq: Every day | ORAL | Status: DC
Start: 2016-05-21 — End: 2016-05-23
  Administered 2016-05-21 – 2016-05-23 (×3): 81 mg via ORAL
  Filled 2016-05-20 (×3): qty 1

## 2016-05-20 MED ORDER — MORPHINE SULFATE (CONCENTRATE) 10 MG/0.5ML PO SOLN: 10.0000 mg | ORAL | Status: DC | PRN

## 2016-05-20 MED ORDER — ENOXAPARIN SODIUM 80 MG/0.8ML ~~LOC~~ SOLN
80.0000 mg | Freq: Two times a day (BID) | SUBCUTANEOUS | Status: DC
  Administered 2016-05-20 – 2016-05-23 (×6): 80 mg via SUBCUTANEOUS
  Filled 2016-05-20 (×6): qty 0.8

## 2016-05-20 MED ORDER — HEPARIN BOLUS VIA INFUSION
1200.0000 [IU] | Freq: Once | INTRAVENOUS | Status: AC
  Administered 2016-05-20: 1200 [IU] via INTRAVENOUS
  Filled 2016-05-20: qty 1200

## 2016-05-20 NOTE — Progress Notes (Signed)
Pt. Wore bi-pap throughout the night and requested it be removed at 0500, [place] at 3 liters applied. Pt. Rested well throughout the night. Received neb treatment as scheduled. Stated this A.M. He felt better. Will continue to monitor pt.

## 2016-05-20 NOTE — Progress Notes (Signed)
Pharmacy Antibiotic Note  Richard Jimenez is a 75 y.o. male admitted on 05/18/2016 with pneumonia.  Pharmacy has been consulted for levofloxacin dosing.  Plan: Levofloxacin 500 mg IV q24h  Height: '5\' 9"'$  (175.3 cm) Weight: 186 lb 11.2 oz (84.7 kg) IBW/kg (Calculated) : 70.7  Temp (24hrs), Avg:97.9 F (36.6 C), Min:97.8 F (36.6 C), Max:98 F (36.7 C)   Recent Labs Lab 05/14/16 0940 05/18/16 0313 05/19/16 0325 05/20/16 0420  WBC 5.5 28.2* 22.2* 21.3*  CREATININE 0.78 0.88  --  0.89    Estimated Creatinine Clearance: 72.8 mL/min (by C-G formula based on SCr of 0.89 mg/dL).    Allergies  Allergen Reactions  . Clarithromycin Nausea And Vomiting  . Penicillins Nausea And Vomiting, Rash and Other (See Comments)    Has patient had a PCN reaction causing immediate rash, facial/tongue/throat swelling, SOB or lightheadedness with hypotension: No Has patient had a PCN reaction causing severe rash involving mucus membranes or skin necrosis: No Has patient had a PCN reaction that required hospitalization No Has patient had a PCN reaction occurring within the last 10 years: No If all of the above answers are "NO", then may proceed with Cephalosporin use.   Antimicrobials this admission: cefepime 8/11 >>  azithomycin 8/11 >> 8/13  Dose adjustments this admission:  Microbiology results: N/a  Thank you for allowing pharmacy to be a part of this patient's care.  Lenis Noon, PharmD Clinical Pharmacist 05/20/2016 11:32 AM

## 2016-05-20 NOTE — Progress Notes (Signed)
    Subjective:  No complaints this morning. Multiple family members at bedside. The patient is currently on BiPAP. He denies chest or abdominal pain. He asks "what are you going to do to me today?"  Objective:  Vital Signs in the last 24 hours: Temp:  [97.8 F (36.6 C)-98 F (36.7 C)] 97.9 F (36.6 C) (08/13 0432) Pulse Rate:  [82-99] 86 (08/13 0801) Resp:  [18-39] 18 (08/13 0432) BP: (128-143)/(60-82) 128/60 (08/13 0801) SpO2:  [96 %-100 %] 97 % (08/13 1131) FiO2 (%):  [28 %-30 %] 30 % (08/13 1131) Weight:  [84.7 kg (186 lb 11.2 oz)-86.4 kg (190 lb 8 oz)] 84.7 kg (186 lb 11.2 oz) (08/13 0432)  Intake/Output from previous day: 08/12 0701 - 08/13 0700 In: 1210.7 [P.O.:660; I.V.:200.7; IV Piggyback:350] Out: 2275 [Urine:2275]  Physical Exam: Pt is alert, NAD on BiPAP HEENT: normal Neck: JVP - normal, carotids 2+= without bruits Lungs: poor air movement bilaterally, end expiratory wheezing CV: RRR without murmur, distant heart sounds Abd: soft, NT, Positive BS, no hepatomegaly Ext: no C/C/E Skin: warm/dry no rash   Lab Results:  Recent Labs  05/19/16 0325 05/20/16 0420  WBC 22.2* 21.3*  HGB 11.4* 11.1*  PLT 150 146*    Recent Labs  05/18/16 0313 05/20/16 0420  NA 137 138  K 4.7 5.1  CL 101 99*  CO2 28 33*  GLUCOSE 248* 211*  BUN 16 28*  CREATININE 0.88 0.89    Recent Labs  05/18/16 1314 05/18/16 1927  TROPONINI 2.11* 2.08*    Tele: Normal sinus rhythm  Assessment/Plan:  1. Elevated troponin: Non-STEMI versus demand ischemia  2. Acute on chronic respiratory failure, improving  3. Leukocytosis  4. 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 DO NOT RESUSCITATE  Discussed patient's condition further with him and his family. I don't think cardiac catheterization should be done in his current condition with tenuous respiratory status. Favor conservative Rx. He has been on long-term lovenox injections and it would be  ok to DC heparin and begin lovenox again from my perspective - will defer to IM team. Would avoid plavix since he is on long-term anticoagulation. Change ASA to 81 mg daily. Continue atorvastatin. Start losartan 25 mg daily in setting of LV dysfunction.   Sherren Mocha, M.D. 05/20/2016, 11:48 AM

## 2016-05-20 NOTE — Progress Notes (Signed)
Pt. Taken off bipap at 1340. He looks much better. He was placed on 2 L nasal cannula.

## 2016-05-20 NOTE — Progress Notes (Signed)
[facility] NAME: Richard Jimenez    MR#:  [id number]  DATE OF BIRTH:  [birth year]  SUBJECTIVE:   Here due to shortness of breath secondary to COPD exacerbation. Still having significant wheezing, shortness of breath even on minimal exertion. Remains intermittently on BiPAP.  REVIEW OF SYSTEMS:    Review of Systems  Constitutional: Negative for chills and fever.  HENT: Negative for congestion and tinnitus.   Eyes: Negative for blurred vision and double vision.  Respiratory: Positive for shortness of breath and wheezing. Negative for cough.   Cardiovascular: Negative for chest pain, orthopnea and PND.  Gastrointestinal: Negative for abdominal pain, diarrhea, nausea and vomiting.  Genitourinary: Negative for dysuria and hematuria.  Neurological: Negative for dizziness, sensory change and focal weakness.  All other systems reviewed and are negative.   Nutrition: Heart Healthy/Carb control Tolerating Diet: Yes Tolerating PT: Await Eval.   DRUG ALLERGIES:   Allergies  Allergen Reactions  . Clarithromycin Nausea And Vomiting  . Penicillins Nausea And Vomiting, Rash and Other (See Comments)    Has patient had a PCN reaction causing immediate rash, facial/tongue/throat swelling, SOB or lightheadedness with hypotension: No Has patient had a PCN reaction causing severe rash involving mucus membranes or skin necrosis: No Has patient had a PCN reaction that required hospitalization No Has patient had a PCN reaction occurring within the last 10 years: No If all of the above answers are "NO", then may proceed with Cephalosporin use.    VITALS:  Blood pressure 122/60, pulse 79, temperature 97.8 F (36.6 C), temperature source Oral, resp. rate (!) 22, height '5\' 9"'$  (1.753 m), weight 84.7 kg (186 lb 11.2 oz), SpO2 98 %.  PHYSICAL EXAMINATION:   Physical Exam  GENERAL:  75 y.o.-year-old patient lying in bed in mild resp. Distress.  EYES:  Pupils equal, round, reactive to light and accommodation. No scleral icterus. Extraocular muscles intact.  HEENT: Head atraumatic, normocephalic. Oropharynx and nasopharynx clear.  NECK:  Supple, no jugular venous distention. No thyroid enlargement, no tenderness.  LUNGS: Prolonged insp & Exp phase.  Diffuse end-exp. wheezing b/l. Poor A/E b/l. No use of accessory muscles of respiration. No rales, rhonchi CARDIOVASCULAR: S1, S2 normal. No murmurs, rubs, or gallops.  ABDOMEN: Soft, nontender, nondistended. Bowel sounds present. No organomegaly or mass.  EXTREMITIES: No cyanosis, clubbing or edema b/l.    NEUROLOGIC: Cranial nerves II through XII are intact. No focal Motor or sensory deficits b/l.   PSYCHIATRIC: The patient is alert and oriented x 3.  SKIN: No obvious rash, lesion, or ulcer.    LABORATORY PANEL:   CBC  Recent Labs Lab 05/20/16 0420  WBC 21.3*  HGB 11.1*  HCT 32.9*  PLT 146*   ------------------------------------------------------------------------------------------------------------------  Chemistries   Recent Labs Lab 05/18/16 0313 05/20/16 0420  NA 137 138  K 4.7 5.1  CL 101 99*  CO2 28 33*  GLUCOSE 248* 211*  BUN 16 28*  CREATININE 0.88 0.89  CALCIUM 8.8* 8.6*  AST 36  --   ALT 42  --   ALKPHOS 47  --   BILITOT 1.1  --    ------------------------------------------------------------------------------------------------------------------  Cardiac Enzymes  Recent Labs Lab 05/18/16 1927  TROPONINI 2.08*   ------------------------------------------------------------------------------------------------------------------  RADIOLOGY:  No results found.   ASSESSMENT AND PLAN:   75 year old male with past History of COPD, small cell lung cancer who presents to the hospital due to severe shortness of breath noted to be in COPD  exacerbation.  1. Acute on chronic respiratory failure with hypoxia-secondary to COPD exacerbation. -Patient is slow to  improve. Continue intermittent BiPAP, Continue IV steroids, Pulmicort nebs, scheduled DuoNeb's. -I will add some Roxanol for air hunger. -Continue empiric antibiotics with cefepime, levaquin. BC + for Strep.   2. COPD exacerbation- the cause of patient's acute respiratory failure. -Continue IV steroids, scheduled DuoNeb's, Pulmicort nebs.  - cont. Empiric IV Cefepime, levaquin. Cont. Bipap.  Roxaonol for air hunger.    3. Elevated Troponin - demand ischemia (vs) NSTEMI.  Most likely supply demand ischemia from COPD Exacerbation and hypoxia.  - Echo showing some LV dysfunction but no plans for acute intervention as per Cardiology. - cont. ASA, change from Heparin gtt to Lovenox, Atorvastatin, B-blocker, and losartan added due to LV dysfunction as per Cards.    4. Diabetes type 2 without complication-continue sliding scale insulin. Follow blood sugars which are stable so far.  5. Leukocytosis-suspect steroid mediated - cont. To monitor.   6. Essential hypertension-continue Toprol.  7. Hx of Lung Cancer - follows w/ Dr. Rogue Bussing.  - will get CT chest w/out contrast to follow up.  Consider Oncology consult if any changes from CT in June'17.    All the records are reviewed and case discussed with Care Management/Social Workerr. Management plans discussed with the patient, family and they are in agreement.  CODE STATUS: Full  DVT Prophylaxis: Heparin drip  TOTAL TIME TAKING CARE OF THIS PATIENT: 30 minutes.   POSSIBLE D/C IN 2-3 DAYS, DEPENDING ON CLINICAL CONDITION.   Henreitta Leber M.D on 05/20/2016 at 1:41 PM  Between 7am to 6pm - Pager - [phone]  After 6pm go to [email or web address] - password [email or web address] [facility]  Office  [phone]  CC: Primary care physician; Madelyn Brunner, MD

## 2016-05-20 NOTE — Progress Notes (Signed)
Patient remains on BIPAP at this time. Will postpone transfer down to CT unit until off of BIPAP and presumably back on N.C. Anticipate will be ready sometime within the next 1-2 hours. Will continue to monitor with the assistance of respiratory therapist. Wenda Low [facility]

## 2016-05-20 NOTE — Progress Notes (Signed)
ANTICOAGULATION CONSULT NOTE -FOLLOW UP   Pharmacy Consult for heparin drip Indication: chest pain/ACS/STEMI  Allergies  Allergen Reactions  . Clarithromycin Nausea And Vomiting  . Penicillins Nausea And Vomiting, Rash and Other (See Comments)    Has patient had a PCN reaction causing immediate rash, facial/tongue/throat swelling, SOB or lightheadedness with hypotension: No Has patient had a PCN reaction causing severe rash involving mucus membranes or skin necrosis: No Has patient had a PCN reaction that required hospitalization No Has patient had a PCN reaction occurring within the last 10 years: No If all of the above answers are "NO", then may proceed with Cephalosporin use.    Patient Measurements: Height: '5\' 9"'$  (175.3 cm) Weight: 186 lb 11.2 oz (84.7 kg) IBW/kg (Calculated) : 70.7 Heparin Dosing Weight: 81.6kg  Vital Signs: Temp: 97.9 F (36.6 C) (08/13 0432) Temp Source: Oral (08/13 0432) BP: 130/62 (08/13 0432) Pulse Rate: 82 (08/13 0432)  Labs:  Recent Labs  05/18/16 0313 05/18/16 0737 05/18/16 1314 05/18/16 1927 05/19/16 0325 05/20/16 0420  HGB 13.0  --   --   --  11.4* 11.1*  HCT 38.4*  --   --   --  33.1* 32.9*  PLT 219  --   --   --  150 146*  APTT 37*  --  69* 67*  --   --   LABPROT 13.0  --   --   --   --   --   INR 0.98  --   --   --   --   --   HEPARINUNFRC 0.96*  --  0.80* 0.60 0.47 0.20*  CREATININE 0.88  --   --   --   --  0.89  TROPONINI 0.20* 0.42* 2.11* 2.08*  --   --     Estimated Creatinine Clearance: 72.8 mL/min (by C-G formula based on SCr of 0.89 mg/dL).   Medical History: Past Medical History:  Diagnosis Date  . Asthma   . CML (chronic myelocytic leukemia) ([place])   . COPD (chronic obstructive pulmonary disease) ([place])   . Diabetes mellitus type II, controlled ( [place])   . Diabetes mellitus without complication ([place])   . Hypertension   . 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 Lung cancer, upper lobe ([place]) 2009  . Myocardial  infarct ([place])   . Small cell lung cancer ([facility])     Medications:  Has apixaban as outpatient but not taking per med rec. Also has enoxaparin therapeutic dose as outpatient medication but unknown last dose.  Assessment:  Goal of Therapy:  Heparin level 0.3-0.7 units/ml Monitor platelets by anticoagulation protocol: Yes   Plan:  Baseline PT/INR, aPTT, and anti-Xa ordered. ER RN requesting to start infusion. Will go ahead and start with standard bolus and infusion as ordered by MD due to unknown home med status and labs pending.  F/u aPTT and heparin level 6 hours after start of infusion.  8/11: Heparin level is slightly elevated however APTT within goal range. Will continue current rate. Will recheck heparin level and APTT in 6 hours.   8/11 @ 19:27 :   HL = 0.6,  APTT = 67 Will use HL to guide dosing from here out.  Will draw next HL on 8/12 with AM labs.   8/12 AM heparin level 0.47. Continue current regimen. Recheck heparin level and CBC with tomorrow AM labs.  8/13 AM heparin level 0.20. Per RN there have been no interruptions in infusion.  1200 unit bolus and increase  rate to 1150 units/hr. Recheck in 8 hours.  Pharmacy to follow per protocol.    Richard Jimenez S 05/20/2016,6:10 AM

## 2016-05-21 DIAGNOSIS — F441 Dissociative fugue: Secondary | ICD-10-CM

## 2016-05-21 DIAGNOSIS — R0600 Dyspnea, unspecified: Secondary | ICD-10-CM

## 2016-05-21 DIAGNOSIS — C3412 Malignant neoplasm of upper lobe, left bronchus or lung: Secondary | ICD-10-CM

## 2016-05-21 DIAGNOSIS — I82C11 Acute embolism and thrombosis of right internal jugular vein: Secondary | ICD-10-CM

## 2016-05-21 DIAGNOSIS — R0602 Shortness of breath: Secondary | ICD-10-CM

## 2016-05-21 DIAGNOSIS — Z79899 Other long term (current) drug therapy: Secondary | ICD-10-CM

## 2016-05-21 DIAGNOSIS — R7989 Other specified abnormal findings of blood chemistry: Secondary | ICD-10-CM

## 2016-05-21 DIAGNOSIS — I2510 Atherosclerotic heart disease of native coronary artery without angina pectoris: Secondary | ICD-10-CM

## 2016-05-21 DIAGNOSIS — R0902 Hypoxemia: Secondary | ICD-10-CM

## 2016-05-21 DIAGNOSIS — F17210 Nicotine dependence, cigarettes, uncomplicated: Secondary | ICD-10-CM

## 2016-05-21 DIAGNOSIS — I82890 Acute embolism and thrombosis of other specified veins: Secondary | ICD-10-CM

## 2016-05-21 DIAGNOSIS — J449 Chronic obstructive pulmonary disease, unspecified: Secondary | ICD-10-CM

## 2016-05-21 DIAGNOSIS — J9602 Acute respiratory failure with hypercapnia: Secondary | ICD-10-CM

## 2016-05-21 DIAGNOSIS — J9601 Acute respiratory failure with hypoxia: Secondary | ICD-10-CM

## 2016-05-21 DIAGNOSIS — J441 Chronic obstructive pulmonary disease with (acute) exacerbation: Principal | ICD-10-CM

## 2016-05-21 DIAGNOSIS — I10 Essential (primary) hypertension: Secondary | ICD-10-CM

## 2016-05-21 DIAGNOSIS — J9620 Acute and chronic respiratory failure, unspecified whether with hypoxia or hypercapnia: Secondary | ICD-10-CM

## 2016-05-21 DIAGNOSIS — I252 Old myocardial infarction: Secondary | ICD-10-CM

## 2016-05-21 DIAGNOSIS — J45909 Unspecified asthma, uncomplicated: Secondary | ICD-10-CM

## 2016-05-21 DIAGNOSIS — R778 Other specified abnormalities of plasma proteins: Secondary | ICD-10-CM

## 2016-05-21 DIAGNOSIS — C9210 Chronic myeloid leukemia, BCR/ABL-positive, not having achieved remission: Secondary | ICD-10-CM

## 2016-05-21 DIAGNOSIS — E119 Type 2 diabetes mellitus without complications: Secondary | ICD-10-CM

## 2016-05-21 DIAGNOSIS — C3410 Malignant neoplasm of upper lobe, unspecified bronchus or lung: Secondary | ICD-10-CM

## 2016-05-21 LAB — CBC
HEMATOCRIT: 35.2 % — AB (ref 40.0–52.0)
Hemoglobin: 11.6 g/dL — ABNORMAL LOW (ref 13.0–18.0)
MCH: 29.1 pg (ref 26.0–34.0)
MCHC: 32.9 g/dL (ref 32.0–36.0)
MCV: 88.6 fL (ref 80.0–100.0)
Platelets: 137 10*3/uL — ABNORMAL LOW (ref 150–440)
RBC: 3.97 MIL/uL — AB (ref 4.40–5.90)
RDW: 16.8 % — AB (ref 11.5–14.5)
WBC: 17.7 10*3/uL — AB (ref 3.8–10.6)

## 2016-05-21 LAB — CULTURE, BLOOD (ROUTINE X 2)

## 2016-05-21 LAB — GLUCOSE, CAPILLARY
Glucose-Capillary: 193 mg/dL — ABNORMAL HIGH (ref 65–99)
Glucose-Capillary: 219 mg/dL — ABNORMAL HIGH (ref 65–99)
Glucose-Capillary: 216 mg/dL — ABNORMAL HIGH (ref 65–99)
Glucose-Capillary: 259 mg/dL — ABNORMAL HIGH (ref 65–99)

## 2016-05-21 MED ORDER — METHYLPREDNISOLONE SODIUM SUCC 125 MG IJ SOLR
60.0000 mg | Freq: Four times a day (QID) | INTRAMUSCULAR | Status: DC
  Administered 2016-05-21 – 2016-05-22 (×4): 60 mg via INTRAVENOUS
  Filled 2016-05-21 (×4): qty 2

## 2016-05-21 MED ORDER — BUDESONIDE 0.5 MG/2ML IN SUSP
0.5000 mg | Freq: Two times a day (BID) | RESPIRATORY_TRACT | Status: DC
  Administered 2016-05-21 – 2016-05-23 (×4): 0.5 mg via RESPIRATORY_TRACT
  Filled 2016-05-21 (×4): qty 2

## 2016-05-21 MED ORDER — PROMETHAZINE HCL 25 MG/ML IJ SOLN
12.5000 mg | Freq: Four times a day (QID) | INTRAMUSCULAR | Status: DC | PRN
  Administered 2016-05-21: 12.5 mg via INTRAVENOUS
  Filled 2016-05-21: qty 1

## 2016-05-21 NOTE — Progress Notes (Signed)
Inpatient Diabetes Program Recommendations  AACE/ADA: New Consensus Statement on Inpatient Glycemic Control (2015)  Target Ranges:  Prepandial:   less than 140 mg/dL      Peak postprandial:   less than 180 mg/dL (1-2 hours)      Critically ill patients:  140 - 180 mg/dL   Results for MILT, COYE (MRN [id number]) as of 05/21/2016 10:42  Ref. Range 05/20/2016 08:10 05/20/2016 12:11 05/20/2016 16:49 05/20/2016 21:27 05/21/2016 07:31  Glucose-Capillary Latest Ref Range: 65 - 99 mg/dL 216 (H) 237 (H) 204 (H) 265 (H) 193 (H)   Review of Glycemic Control  Diabetes history: DM2 Outpatient Diabetes medications: Metformin 1000 mg BID Current orders for Inpatient glycemic control: Novolog 0-9 units TID with meals, Novolog 0-5 units QHS  Inpatient Diabetes Program Recommendations: Insulin - Basal: If steroids are continued as ordered, please consider ordering Lantus 5 units Q24H. Insulin - Meal Coverage: If steroids are continued, please consider ordering Novolog 3 units TID with meals for meal coverage.  Thanks, Barnie Alderman, RN, MSN, CDE Diabetes Coordinator Inpatient Diabetes Program [phone] (Team Pager from [place] to [place]) [phone] ([facility] office) [phone] [facility] office) [phone] [facility] office)

## 2016-05-21 NOTE — Progress Notes (Signed)
Patient: Richard Jimenez / Admit Date: 05/18/2016 / Date of Encounter: 05/21/2016, 8:09 AM   Subjective: Breathing better. Off BiPAP since the afternoon of 8/13. Still with some cough and wheezing. Taking breathing treatments. Has been transitioned off IV heparin (s/p 48 hours of therapy) and is back on Lovenox given his DVT. No chest pain. Wants to go home.   Review of Systems: Review of Systems  Constitutional: Positive for malaise/fatigue. Negative for chills, diaphoresis, fever and weight loss.  HENT: Negative for congestion.   Eyes: Negative for discharge and redness.  Respiratory: Positive for cough, shortness of breath and wheezing. Negative for sputum production.   Cardiovascular: Negative for chest pain, palpitations, orthopnea, claudication, leg swelling and PND.  Gastrointestinal: Negative for abdominal pain, heartburn, nausea and vomiting.  Genitourinary: Negative for hematuria.  Musculoskeletal: Negative for falls and myalgias.  Skin: Negative for rash.  Neurological: Positive for weakness. Negative for dizziness, tingling, tremors, sensory change, speech change, focal weakness and loss of consciousness.  Endo/Heme/Allergies: Does not bruise/bleed easily.  Psychiatric/Behavioral: Negative for substance abuse. The patient is not nervous/anxious.   All other systems reviewed and are negative.   Objective: Telemetry: NSR, 80's LBBB Physical Exam: Blood pressure 133/69, pulse 79, temperature 97.9 F (36.6 C), temperature source Oral, resp. rate 20, height '5\' 9"'$  (1.753 m), weight 182 lb 14.4 oz (83 kg), SpO2 99 %. Body mass index is 27.01 kg/m. General: Well developed, well nourished, in no acute distress. Head: Normocephalic, atraumatic, sclera non-icteric, no xanthomas, nares are without discharge. Neck: Negative for carotid bruits. JVP not elevated. Lungs: Decreased breath sounds bilaterally with diffuse wheezing. Breathing is unlabored. Heart: RRR S1 S2 without murmurs,  rubs, or gallops.  Abdomen: Soft, non-tender, non-distended with normoactive bowel sounds. No rebound/guarding. Extremities: No clubbing or cyanosis. No edema. Distal pedal pulses are 2+ and equal bilaterally. Neuro: Alert and oriented X 3. Moves all extremities spontaneously. Psych:  Responds to questions appropriately with a normal affect.   Intake/Output Summary (Last 24 hours) at 05/21/16 0809 Last data filed at 05/21/16 0750  Gross per 24 hour  Intake              360 ml  Output             2275 ml  Net            -1915 ml    Inpatient Medications:  . antiseptic oral rinse  7 mL Mouth Rinse BID  . aspirin  81 mg Oral Daily  . atorvastatin  40 mg Oral q1800  . budesonide (PULMICORT) nebulizer solution  0.25 mg Nebulization BID  . ceFEPime (MAXIPIME) IV  2 g Intravenous Q8H  . docusate sodium  100 mg Oral BID  . enoxaparin  80 mg Subcutaneous Q12H  . insulin aspart  0-5 Units Subcutaneous QHS  . insulin aspart  0-9 Units Subcutaneous TID WC  . ipratropium-albuterol  3 mL Nebulization Q4H  . levofloxacin (LEVAQUIN) IV  500 mg Intravenous Q24H  . losartan  25 mg Oral Daily  . methylPREDNISolone (SOLU-MEDROL) injection  40 mg Intravenous Q6H  . metoprolol succinate  50 mg Oral Daily  . nicotine  21 mg Transdermal Daily  . nitroGLYCERIN  0.5 inch Topical Q8H  . sodium chloride flush  3 mL Intravenous Q12H  . tiotropium  18 mcg Inhalation Daily   Infusions:    Labs:  Recent Labs  05/20/16 0420  NA 138  K 5.1  CL 99*  CO2 33*  GLUCOSE 211*  BUN 28*  CREATININE 0.89  CALCIUM 8.6*   No results for input(s): AST, ALT, ALKPHOS, BILITOT, PROT, ALBUMIN in the last 72 hours.  Recent Labs  05/20/16 0420 05/21/16 0554  WBC 21.3* 17.7*  HGB 11.1* 11.6*  HCT 32.9* 35.2*  MCV 87.1 88.6  PLT 146* 137*    Recent Labs  05/18/16 1314 05/18/16 1927  TROPONINI 2.11* 2.08*   Invalid input(s): POCBNP  Recent Labs  05/18/16 1314  HGBA1C 6.1*     Weights: Filed  Weights   05/19/16 1500 05/20/16 0432 05/21/16 0455  Weight: 190 lb 8 oz (86.4 kg) 186 lb 11.2 oz (84.7 kg) 182 lb 14.4 oz (83 kg)     Radiology/Studies:  Ct Head W Wo Contrast  Addendum Date: 05/02/2016   ADDENDUM REPORT: 05/02/2016 14:50 ADDENDUM: This examination was completed without being seen because of some sort of technical fault. Comparison is made to the study of 04/18/2016 and multiple previous narrow imaging studies including MRI 01/17/2016. The the brainstem and cerebellum are normal. Incidental mega cisterna magna. There is an old lacunar infarction in the right medial thalamus. Cerebral hemispheres otherwise appear normal. No evidence of mass lesion, hemorrhage, hydrocephalus or extra-axial collection. No abnormal enhancement occurs. Sinuses, middle ears and mastoids are clear. No calvarial lesion. IMPRESSION: IMPRESSION No evidence of metastatic disease. No change from previous studies. 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 Electronically Signed   By: Nelson Chimes M.D.   On: 05/02/2016 14:50  Result Date: 05/02/2016 EXAM: CT HEAD WITHOUT AND WITH CONTRAST TECHNIQUE: Contiguous axial images were obtained from the base of the skull through the vertex without and with intravenous contrast CONTRAST:  69m ISOVUE-300 IOPAMIDOL (ISOVUE-300) INJECTION 61% COMPARISON:  None. Electronically Signed: By: MNelson ChimesM.D. On: 05/02/2016 12:26  Ct Chest Wo Contrast  Result Date: 05/20/2016 CLINICAL DATA:  Shortness of breath. COPD. Previous myocardial infarct. Left upper lobe lung carcinoma. EXAM: CT CHEST WITHOUT CONTRAST TECHNIQUE: Multidetector CT imaging of the chest was performed following the standard protocol without IV contrast. COMPARISON:  03/30/2016 FINDINGS: Cardiovascular: Normal heart size. Aortic atherosclerosis noted. Three-vessel coronary artery calcification also demonstrated. Mediastinum/Nodes: Sub-cm mediastinal lymph nodes in the right paratracheal region show mild increase  in size. Subcarinal lymphadenopathy shows mild increased, currently measuring 1.6 cm on image 62/2 compared to 10 mm previously. Left cardiophrenic angle lymph node on image 115/2 measures 16 mm compared to 9 mm previously. Hilar lymph nodes are difficult to evaluate on this unenhanced exam. Lungs/Pleura: New small pleural effusions are seen bilaterally. Increased thickening of interlobular pulmonary septa seen in the lung bases bilaterally, suspicious for mild interstitial edema. Mild emphysema again demonstrated. Radiation changes with central bronchiectasis in central left upper lobe appears stable. 1.4 cm right upper lobe pulmonary nodule on image 37/3 remains unchanged compared to multiple previous studies dating back to 2014, consistent with benign etiology. No new or enlarging pulmonary nodules or masses identified. Upper Abdomen: Stable small bilateral low-attenuation adrenal adenomas. Musculoskeletal: No chest wall mass or suspicious bone lesions identified. IMPRESSION: New small bilateral pleural effusions. Increased thickening of pulmonary interlobular septa in both lung bases, suspicious for mild interstitial edema. Mild increase in mediastinal lymphadenopathy since most recent exam. This is nonspecific and could be due to lymphedema given suspected mild interstitial edema, although enlarging lymph node metastases cannot definitely be excluded. Recommend continued short-term followup by chest CT in 3 months. Stable left upper lobe radiation changes.  Stable mild emphysema. Stable right  upper lobe pulmonary nodule, consistent with benign etiology. Stable small bilateral adrenal adenomas. Aortic atherosclerosis and coronary artery calcification. Electronically Signed   By: Earle Gell M.D.   On: 05/20/2016 14:56   Dg Chest Port 1 View  Result Date: 05/18/2016 CLINICAL DATA:  Acute onset of wheezing and tachypnea. Tachycardia. Initial encounter. EXAM: PORTABLE CHEST 1 VIEW COMPARISON:  Chest radiograph  performed 12/28/2015, and CT of the chest performed 03/30/2016 FINDINGS: The lungs are well-aerated. Left apical opacity is again seen. This may simply reflect post therapy scarring, though residual or recurrent mass cannot be entirely excluded. Vascular congestion is seen. Increased interstitial markings may reflect mild interstitial edema. No pleural effusion or pneumothorax is seen. The cardiomediastinal silhouette is within normal limits. No acute osseous abnormalities are seen. IMPRESSION: 1. Increased interstitial markings may reflect mild interstitial edema. Vascular congestion noted. 2. Left apical opacity again noted. This may simply reflect post therapy scarring, though residual or recurrent mass cannot be entirely excluded. This could be assessed on PET/CT, depending on the degree of clinical concern. Electronically Signed   By: Garald Balding M.D.   On: 05/18/2016 04:09     Assessment and Plan  Principal Problem:   Acute respiratory failure ([place]) Active Problems:   Lung cancer, upper lobe ([facility])   COPD exacerbation ([facility])   NSTEMI (non-ST elevated myocardial infarction) ([place])   Jugular vein thrombosis, right    1. Elevated troponin: -NSTEMI vs demand ischemia -Decision was made with the family on 8/13 to not undergo invasive procedure such as R/LHC given his pulmonary status and comorbidities  -He received 48 hours + of IV heparin and has been transitioned back to Lovenox as he was on this for his DVT -Aspirin  -Toprol XL -Losartan -Avoid adding Plavix given he is on long term anticoagulation and manage with aspirin and Lovenox (for DVT)  2. Chronic combined CHF: -Breathing better -Not on BiPAP -Toprol XL as above -May need low-dose Lasix at time of discharge (possibly prn dosing) -CHF education   3. Acute on chronic respiratory failure: -Improving -Off BiPAP  -Per IM  4. Small cell lung cancer: -Status post chemotherapy -Per IM  5. DVT of jugular: -Lovenox     Signed, Christell Faith, PA-C [facility] Pager: [phone] 05/21/2016, 8:09 AM

## 2016-05-21 NOTE — Progress Notes (Signed)
Pt. Slept through the night, he did not use bi-pap, he stated his breathing was better, his breathing was not as labored, he continued using 3LNC throughout the night. No signs or c/o pain, SOB or acute distress noted. Will continue to monitor pt.

## 2016-05-21 NOTE — Progress Notes (Signed)
[facility] [place] NAME: Richard Jimenez    MR#:  [id number]  DATE OF BIRTH:  Apr 03, 1941  SUBJECTIVE:   Here due to shortness of breath secondary to COPD exacerbation. Still having significant wheezing, shortness of breath even on minimal exertion. Remains intermittently on BiPAP.  REVIEW OF SYSTEMS:    Review of Systems  Constitutional: Negative for chills and fever.  HENT: Negative for congestion and tinnitus.   Eyes: Negative for blurred vision and double vision.  Respiratory: Positive for shortness of breath and wheezing. Negative for cough.   Cardiovascular: Negative for chest pain, orthopnea and PND.  Gastrointestinal: Negative for abdominal pain, diarrhea, nausea and vomiting.  Genitourinary: Negative for dysuria and hematuria.  Neurological: Negative for dizziness, sensory change and focal weakness.  All other systems reviewed and are negative.   Nutrition: Heart Healthy/Carb control Tolerating Diet: Yes Tolerating PT: Await Eval.   DRUG ALLERGIES:   Allergies  Allergen Reactions  . Clarithromycin Nausea And Vomiting  . Penicillins Nausea And Vomiting, Rash and Other (See Comments)    Has patient had a PCN reaction causing immediate rash, facial/tongue/throat swelling, SOB or lightheadedness with hypotension: No Has patient had a PCN reaction causing severe rash involving mucus membranes or skin necrosis: No Has patient had a PCN reaction that required hospitalization No Has patient had a PCN reaction occurring within the last 10 years: No If all of the above answers are "NO", then may proceed with Cephalosporin use.    VITALS:  Blood pressure 123/60, pulse 87, temperature 98.2 F (36.8 C), temperature source Oral, resp. rate 20, height '5\' 9"'$  (1.753 m), weight 83 kg (182 lb 14.4 oz), SpO2 98 %.  PHYSICAL EXAMINATION:   Physical Exam  GENERAL:  75 y.o.-year-old patient lying in bed in mild to mod. resp. Distress.  EYES:  Pupils equal, round, reactive to light and accommodation. No scleral icterus. Extraocular muscles intact.  HEENT: Head atraumatic, normocephalic. Oropharynx and nasopharynx clear.  NECK:  Supple, no jugular venous distention. No thyroid enlargement, no tenderness.  LUNGS: Prolonged insp & Exp phase.  Diffuse end-exp. wheezing b/l. Poor A/E b/l. No use of accessory muscles of respiration. No rales, rhonchi CARDIOVASCULAR: S1, S2 normal. No murmurs, rubs, or gallops.  ABDOMEN: Soft, nontender, nondistended. Bowel sounds present. No organomegaly or mass.  EXTREMITIES: No cyanosis, clubbing or edema b/l.    NEUROLOGIC: Cranial nerves II through XII are intact. No focal Motor or sensory deficits b/l. Globally weak.    PSYCHIATRIC: The patient is alert and oriented x 3.  SKIN: No obvious rash, lesion, or ulcer.    LABORATORY PANEL:   CBC  Recent Labs Lab 05/21/16 0554  WBC 17.7*  HGB 11.6*  HCT 35.2*  PLT 137*   ------------------------------------------------------------------------------------------------------------------  Chemistries   Recent Labs Lab 05/18/16 0313 05/20/16 0420  NA 137 138  K 4.7 5.1  CL 101 99*  CO2 28 33*  GLUCOSE 248* 211*  BUN 16 28*  CREATININE 0.88 0.89  CALCIUM 8.8* 8.6*  AST 36  --   ALT 42  --   ALKPHOS 47  --   BILITOT 1.1  --    ------------------------------------------------------------------------------------------------------------------  Cardiac Enzymes  Recent Labs Lab 05/18/16 1927  TROPONINI 2.08*   ------------------------------------------------------------------------------------------------------------------  RADIOLOGY:  Ct Chest Wo Contrast  Result Date: 05/20/2016 CLINICAL DATA:  Shortness of breath. COPD. Previous myocardial infarct. Left upper lobe lung carcinoma. EXAM: CT CHEST WITHOUT CONTRAST TECHNIQUE: Multidetector CT imaging of  the chest was performed following the standard protocol without IV contrast.  COMPARISON:  03/30/2016 FINDINGS: Cardiovascular: Normal heart size. Aortic atherosclerosis noted. Three-vessel coronary artery calcification also demonstrated. Mediastinum/Nodes: Sub-cm mediastinal lymph nodes in the right paratracheal region show mild increase in size. Subcarinal lymphadenopathy shows mild increased, currently measuring 1.6 cm on image 62/2 compared to 10 mm previously. Left cardiophrenic angle lymph node on image 115/2 measures 16 mm compared to 9 mm previously. Hilar lymph nodes are difficult to evaluate on this unenhanced exam. Lungs/Pleura: New small pleural effusions are seen bilaterally. Increased thickening of interlobular pulmonary septa seen in the lung bases bilaterally, suspicious for mild interstitial edema. Mild emphysema again demonstrated. Radiation changes with central bronchiectasis in central left upper lobe appears stable. 1.4 cm right upper lobe pulmonary nodule on image 37/3 remains unchanged compared to multiple previous studies dating back to 2014, consistent with benign etiology. No new or enlarging pulmonary nodules or masses identified. Upper Abdomen: Stable small bilateral low-attenuation adrenal adenomas. Musculoskeletal: No chest wall mass or suspicious bone lesions identified. IMPRESSION: New small bilateral pleural effusions. Increased thickening of pulmonary interlobular septa in both lung bases, suspicious for mild interstitial edema. Mild increase in mediastinal lymphadenopathy since most recent exam. This is nonspecific and could be due to lymphedema given suspected mild interstitial edema, although enlarging lymph node metastases cannot definitely be excluded. Recommend continued short-term followup by chest CT in 3 months. Stable left upper lobe radiation changes.  Stable mild emphysema. Stable right upper lobe pulmonary nodule, consistent with benign etiology. Stable small bilateral adrenal adenomas. Aortic atherosclerosis and coronary artery calcification.  Electronically Signed   By: Earle Gell M.D.   On: 05/20/2016 14:56     ASSESSMENT AND PLAN:   75 year old male with past History of COPD, small cell lung cancer who presents to the hospital due to severe shortness of breath noted to be in COPD exacerbation.  1. Acute on chronic respiratory failure with hypoxia-secondary to COPD exacerbation. -Patient is slow to improve. Continue intermittent BiPAP, Continue IV steroids, Pulmicort nebs, scheduled DuoNeb's. -Continue empiric antibiotics with levaquin. BC + for Strep.  - CT chest obtained yesterday showing ?? Lymphangitis spread and will await Hem/Onc input.  Cont. Supportive care for now.    2. COPD exacerbation- the cause of patient's acute respiratory failure. -Continue IV steroids and will escalate dose, cont. scheduled DuoNeb's, Pulmicort nebs.  - cont. Empiric levaquin. Cont. Bipap.  Roxaonol for air hunger.    3. Elevated Troponin - demand ischemia (vs) NSTEMI.  Most likely supply demand ischemia from COPD Exacerbation and hypoxia.  - Echo showing some LV dysfunction but no plans for acute intervention as per Cardiology. - cont. ASA, Lovenox, Atorvastatin, B-blocker, and losartan added due to LV dysfunction as per Cards.    4. Right IJ Thrombus - cont. lovenox SQ.    5. Diabetes type 2 without complication-continue sliding scale insulin. Follow blood sugars which are stable so far.  6. Leukocytosis-suspect steroid mediated - cont. To monitor.   6. Essential hypertension-continue Toprol.  7. Hx of Lung Cancer - follows w/ Dr. Rogue Bussing.  - CT chest yesterday showing ?? Lymphangitic spread of tumor. Await Hem/Onc input.    Will get PT eval today.    All the records are reviewed and case discussed with Care Management/Social Workerr. Management plans discussed with the patient, family and they are in agreement.  CODE STATUS: Full  DVT Prophylaxis: Lovenox.  TOTAL TIME TAKING CARE OF THIS PATIENT: 30 minutes.  POSSIBLE  D/C IN 2-3 DAYS, DEPENDING ON CLINICAL CONDITION.   Henreitta Leber M.D on 05/21/2016 at 3:50 PM  Between 7am to 6pm - Pager - [phone]  After 6pm go to [email or web address] - password [email or web address] [facility]  Office  [phone]  CC: Primary care physician; Madelyn Brunner, MD

## 2016-05-21 NOTE — Progress Notes (Signed)
[name] NOTE  Patient Care Team: Madelyn Brunner, MD as PCP - General (Internal Medicine)  CHIEF COMPLAINTS/PURPOSE OF CONSULTATION: Small cell lung cancer  HISTORY OF PRESENTING ILLNESS:  Richard Jimenez 75 y.o.  male history of extensive stage small cell lung cancer current every the hospital for worsening shortness of breath and cough. Patient received cycle #3 approximately 1 week ago.   Patient noted to have worsening shortness of breath and cough 1-2 days prior to admission. Denies any hemoptysis. Mild improvement noted during the hospital stay; however patient is currently not to baseline. A CT chest was done yesterday noncontrast- that showed mild interstitial thickening raising the possibility of lymphangitic spread from his underlying malignancy. Oncology has been consulted.  Continues to have shortness of breath on exertion. No swelling in the legs. Headaches and neck swelling improved.   ROS: A complete 10 point review of system is done which is negative except mentioned above in history of present illness  MEDICAL HISTORY:  Past Medical History:  Diagnosis Date  . Asthma   . CML (chronic myelocytic leukemia) ([place])   . COPD (chronic obstructive pulmonary disease) ([place])   . Diabetes mellitus type II, controlled ([place])   . Diabetes mellitus without complication ([place])   . Hypertension   . 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 Lung cancer, upper lobe ([place]) 2009  . Myocardial infarct ([place])   . Small cell lung cancer ([place])     SURGICAL HISTORY: Past Surgical History:  Procedure Laterality Date  . PERIPHERAL VASCULAR CATHETERIZATION N/A 02/21/2016   Procedure: Glori Luis Cath Insertion;  Surgeon: Algernon Huxley, MD;  Location: [facility];  Service: Cardiovascular;  Laterality: N/A;  . PERIPHERAL VASCULAR CATHETERIZATION Right 04/19/2016   Procedure: Thrombectomy/thrombolysis right IJ DVT;  Surgeon: Algernon Huxley, MD;  Location: [facility]  [facility];  Service: Cardiovascular;  Laterality: Right;  . PORT-A-CATH REMOVAL N/A 04/07/2016   Procedure: REMOVAL PORT-A-CATH;  Surgeon: Serafina Mitchell, MD;  Location: [facility] ORS;  Service: Vascular;  Laterality: N/A;  . THROAT SURGERY    . TUMOR REMOVAL Right    "years ago-right lower posterior side"    SOCIAL HISTORY: Social History   Social History  . Marital status: Married    Spouse name: N/A  . Number of children: N/A  . Years of education: N/A   Occupational History  . Not on file.   Social History Main Topics  . Smoking status: Current Every Day Smoker    Packs/day: 1.00    Years: 62.00    Types: Cigarettes  . Smokeless tobacco: Never Used  . Alcohol use 3.6 oz/week    6 Cans of beer per week     Comment: rarely  . Drug use: No  . Sexual activity: Not on file   Other Topics Concern  . Not on file   Social History Narrative  . No narrative on file    FAMILY HISTORY: Family History  Problem Relation Age of Onset  . Cancer - Colon Father   . Cancer - Colon Sister     ALLERGIES:  is allergic to clarithromycin and penicillins.  MEDICATIONS:  Current Facility-Administered Medications  Medication Dose Route Frequency Provider Last Rate Last Dose  . acetaminophen (TYLENOL) tablet 650 mg  650 mg Oral Q6H PRN Harrie Foreman, MD       Or  . acetaminophen (TYLENOL) suppository 650 mg  650 mg Rectal Q6H PRN Norva Riffle  Marcille Blanco, MD      . albuterol (PROVENTIL) (2.5 MG/3ML) 0.083% nebulizer solution 3 mL  3 mL Inhalation Q2H PRN Harrie Foreman, MD   3 mL at 05/19/16 1831  . antiseptic oral rinse (CPC / CETYLPYRIDINIUM CHLORIDE 0.05%) solution 7 mL  7 mL Mouth Rinse BID Theodoro Grist, MD   7 mL at 05/20/16 2200  . aspirin chewable tablet 81 mg  81 mg Oral Daily Sherren Mocha, MD   81 mg at 05/21/16 0921  . atorvastatin (LIPITOR) tablet 40 mg  40 mg Oral q1800 Wellington Hampshire, MD   40 mg at 05/21/16 1800  . budesonide (PULMICORT) nebulizer solution 0.5 mg  0.5 mg  Nebulization BID Henreitta Leber, MD      . docusate sodium (COLACE) capsule 100 mg  100 mg Oral BID Harrie Foreman, MD   100 mg at 05/21/16 6222  . enoxaparin (LOVENOX) injection 80 mg  80 mg Subcutaneous Q12H Henreitta Leber, MD   80 mg at 05/21/16 1432  . insulin aspart (novoLOG) injection 0-5 Units  0-5 Units Subcutaneous QHS Harrie Foreman, MD   3 Units at 05/20/16 2223  . insulin aspart (novoLOG) injection 0-9 Units  0-9 Units Subcutaneous TID WC Harrie Foreman, MD   3 Units at 05/21/16 1619  . ipratropium-albuterol (DUONEB) 0.5-2.5 (3) MG/3ML nebulizer solution 3 mL  3 mL Nebulization Q4H Flora Lipps, MD   3 mL at 05/21/16 1525  . levofloxacin (LEVAQUIN) IVPB 500 mg  500 mg Intravenous Q24H Lenis Noon, RPH   500 mg at 05/21/16 1431  . losartan (COZAAR) tablet 25 mg  25 mg Oral Daily Sherren Mocha, MD   25 mg at 05/21/16 9798  . methylPREDNISolone sodium succinate (SOLU-MEDROL) 125 mg/2 mL injection 60 mg  60 mg Intravenous Q6H Henreitta Leber, MD   60 mg at 05/21/16 1613  . metoprolol succinate (TOPROL-XL) 24 hr tablet 50 mg  50 mg Oral Daily Theodoro Grist, MD   50 mg at 05/21/16 0921  . morphine CONCENTRATE 10 MG/0.5ML oral solution 10 mg  10 mg Oral Q2H PRN Henreitta Leber, MD      . nicotine (NICODERM CQ - dosed in mg/24 hours) patch 21 mg  21 mg Transdermal Daily Lytle Butte, MD   21 mg at 05/21/16 9211  . nitroGLYCERIN (NITROGLYN) 2 % ointment 0.5 inch  0.5 inch Topical Q8H Theodoro Grist, MD   0.5 inch at 05/21/16 1432  . ondansetron (ZOFRAN) tablet 4 mg  4 mg Oral Q6H PRN Harrie Foreman, MD       Or  . ondansetron [facility]) injection 4 mg  4 mg Intravenous Q6H PRN Harrie Foreman, MD   4 mg at 05/21/16 1613  . oxyCODONE (Oxy IR/ROXICODONE) immediate release tablet 5 mg  5 mg Oral Q4H PRN Harrie Foreman, MD      . promethazine (PHENERGAN) injection 12.5 mg  12.5 mg Intravenous Q6H PRN Henreitta Leber, MD   12.5 mg at 05/21/16 1755  . sodium chloride flush (NS) 0.9  % injection 3 mL  3 mL Intravenous Q12H Harrie Foreman, MD   3 mL at 05/21/16 0924  . tiotropium (SPIRIVA) inhalation capsule 18 mcg  18 mcg Inhalation Daily Flora Lipps, MD   18 mcg at 05/21/16 0828      .  PHYSICAL EXAMINATION:  Vitals:   05/21/16 0745 05/21/16 1114  BP: 133/69 123/60  Pulse: 79 87  Resp:  20  Temp:  98.2 F (36.8 C)   Filed Weights   05/19/16 1500 05/20/16 0432 05/21/16 0455  Weight: 190 lb 8 oz (86.4 kg) 186 lb 11.2 oz (84.7 kg) 182 lb 14.4 oz (83 kg)    GENERAL: Well-nourished well-developed; Alert, Mild-moderate respiratory distress.  Accompanied by family. EYES: no pallor or icterus OROPHARYNX: no thrush or ulceration. NECK: supple, no masses felt LYMPH:  no palpable lymphadenopathy in the cervical, axillary or inguinal regions LUNGS: decreased breath sounds to auscultation; bilateral diffuse wheezes. HEART/CVS: regular rate & rhythm and no murmurs; No lower extremity edema ABDOMEN: abdomen soft, non-tender and normal bowel sounds Musculoskeletal:no cyanosis of digits and no clubbing  PSYCH: alert & oriented x 3 with fluent speech NEURO: no focal motor/sensory deficits SKIN:  no rashes or significant lesions  LABORATORY DATA:  I have reviewed the data as listed Lab Results  Component Value Date   WBC 17.7 (H) 05/21/2016   HGB 11.6 (L) 05/21/2016   HCT 35.2 (L) 05/21/2016   MCV 88.6 05/21/2016   PLT 137 (L) 05/21/2016    Recent Labs  01/13/16 1439  05/02/16 0912 05/14/16 0940 05/18/16 0313 05/20/16 0420  NA  --   < > 133* 132* 137 138  K  --   < > 4.0 4.1 4.7 5.1  CL  --   < > 98* 99* 101 99*  CO2  --   < > '27 27 28 '$ 33*  GLUCOSE  --   < > 198* 128* 248* 211*  BUN  --   < > '14 10 16 '$ 28*  CREATININE 0.89  < > 0.80 0.78 0.88 0.89  CALCIUM  --   < > 8.9 8.9 8.8* 8.6*  GFRNONAA >60  < > >60 >60 >60 >60  GFRAA >60  < > >60 >60 >60 >60  PROT 8.3*  < > 7.7 7.8 7.8  --   ALBUMIN 4.2  < > 3.9 4.1 4.1  --   AST 24  < > 24 19 36  --    ALT 20  < > 24 20 42  --   ALKPHOS 49  < > 45 47 47  --   BILITOT 0.5  < > 0.5 0.5 1.1  --   BILIDIR <0.1*  --   --   --   --   --   IBILI NOT CALCULATED  --   --   --   --   --   < > = values in this interval not displayed.  RADIOGRAPHIC STUDIES: I have personally reviewed the radiological images as listed and agreed with the findings in the report. Ct Head W Wo Contrast  Addendum Date: 05/02/2016   ADDENDUM REPORT: 05/02/2016 14:50 ADDENDUM: This examination was completed without being seen because of some sort of technical fault. Comparison is made to the study of 04/18/2016 and multiple previous narrow imaging studies including MRI 01/17/2016. The the brainstem and cerebellum are normal. Incidental mega cisterna magna. There is an old lacunar infarction in the right medial thalamus. Cerebral hemispheres otherwise appear normal. No evidence of mass lesion, hemorrhage, hydrocephalus or extra-axial collection. No abnormal enhancement occurs. Sinuses, middle ears and mastoids are clear. No calvarial lesion. IMPRESSION: IMPRESSION No evidence of metastatic disease. No change from previous studies. 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 Electronically Signed   By: Nelson Chimes M.D.   On: 05/02/2016 14:50  Result Date: 05/02/2016 EXAM: CT HEAD WITHOUT AND WITH CONTRAST TECHNIQUE: Contiguous  axial images were obtained from the base of the skull through the vertex without and with intravenous contrast CONTRAST:  54m ISOVUE-300 IOPAMIDOL (ISOVUE-300) INJECTION 61% COMPARISON:  None. Electronically Signed: By: MNelson ChimesM.D. On: 05/02/2016 12:26  Ct Chest Wo Contrast  Result Date: 05/20/2016 CLINICAL DATA:  Shortness of breath. COPD. Previous myocardial infarct. Left upper lobe lung carcinoma. EXAM: CT CHEST WITHOUT CONTRAST TECHNIQUE: Multidetector CT imaging of the chest was performed following the standard protocol without IV contrast. COMPARISON:  03/30/2016 FINDINGS: Cardiovascular: Normal heart  size. Aortic atherosclerosis noted. Three-vessel coronary artery calcification also demonstrated. Mediastinum/Nodes: Sub-cm mediastinal lymph nodes in the right paratracheal region show mild increase in size. Subcarinal lymphadenopathy shows mild increased, currently measuring 1.6 cm on image 62/2 compared to 10 mm previously. Left cardiophrenic angle lymph node on image 115/2 measures 16 mm compared to 9 mm previously. Hilar lymph nodes are difficult to evaluate on this unenhanced exam. Lungs/Pleura: New small pleural effusions are seen bilaterally. Increased thickening of interlobular pulmonary septa seen in the lung bases bilaterally, suspicious for mild interstitial edema. Mild emphysema again demonstrated. Radiation changes with central bronchiectasis in central left upper lobe appears stable. 1.4 cm right upper lobe pulmonary nodule on image 37/3 remains unchanged compared to multiple previous studies dating back to 2014, consistent with benign etiology. No new or enlarging pulmonary nodules or masses identified. Upper Abdomen: Stable small bilateral low-attenuation adrenal adenomas. Musculoskeletal: No chest wall mass or suspicious bone lesions identified. IMPRESSION: New small bilateral pleural effusions. Increased thickening of pulmonary interlobular septa in both lung bases, suspicious for mild interstitial edema. Mild increase in mediastinal lymphadenopathy since most recent exam. This is nonspecific and could be due to lymphedema given suspected mild interstitial edema, although enlarging lymph node metastases cannot definitely be excluded. Recommend continued short-term followup by chest CT in 3 months. Stable left upper lobe radiation changes.  Stable mild emphysema. Stable right upper lobe pulmonary nodule, consistent with benign etiology. Stable small bilateral adrenal adenomas. Aortic atherosclerosis and coronary artery calcification. Electronically Signed   By: JEarle GellM.D.   On: 05/20/2016  14:56   Dg Chest Port 1 View  Result Date: 05/18/2016 CLINICAL DATA:  Acute onset of wheezing and tachypnea. Tachycardia. Initial encounter. EXAM: PORTABLE CHEST 1 VIEW COMPARISON:  Chest radiograph performed 12/28/2015, and CT of the chest performed 03/30/2016 FINDINGS: The lungs are well-aerated. Left apical opacity is again seen. This may simply reflect post therapy scarring, though residual or recurrent mass cannot be entirely excluded. Vascular congestion is seen. Increased interstitial markings may reflect mild interstitial edema. No pleural effusion or pneumothorax is seen. The cardiomediastinal silhouette is within normal limits. No acute osseous abnormalities are seen. IMPRESSION: 1. Increased interstitial markings may reflect mild interstitial edema. Vascular congestion noted. 2. Left apical opacity again noted. This may simply reflect post therapy scarring, though residual or recurrent mass cannot be entirely excluded. This could be assessed on PET/CT, depending on the degree of clinical concern. Electronically Signed   By: JGarald BaldingM.D.   On: 05/18/2016 04:09    ASSESSMENT & PLAN:  75yo with small cell lung cancer- extensive stage currently admitted to hospital for worsening shortness of breath/cough.   # Acute on chronic respiratory failure- likely acute COPD exacerbation. ? Underlying small cell lung cancer- lymphangitic spread. Less likely, CHF. For now recommend continued treatment of his COPD exacerbation.  # Small cell lung cancer- Extensive stage small cell lung cancer-Status post cycle #3  of [place]  etoposide approximately 4 days ago. Cycle #3 interrupted because of right IJ DVT.   # Nausea and vomiting-likely secondary to the recent chemotherapy approximately one week ago. Continue Zofran and Phenergan as needed.  #  Extensive right IJ DVT-   S/p thrombolysis; on Lovenox 80 mg BID.   # DNR/DNI- discussed with the patient and family.    Thank you Dr.Sainani for  allowing me to participate in the care of your pleasant patient. Please do not hesitate to contact me with questions or concerns in the interim.    Cammie Sickle, MD 05/21/2016 7:50 PM

## 2016-05-21 NOTE — Care Management (Addendum)
Discussed during progression the need to assess patient for home 02.  Patient with audible wheezing but says "I have been wheezing like this since I was 75 years old."  Says that when he signed up for medicare he did not sign up for part D coverage because he was still working and [employer] paid for his meds.  He is vague when discussing time frames.  he says "if I sign up now, I will get penalized."  CM acknowledged the penalty but discussed that  importance of signing up for a medicare D plan.  [facility]oke with [facility] and informed that if patient needed assist with meds at discharge, agency could assist once time BUT agency will set up an appointment to help patient sign up for a part d plan.  This patient due to  limited medication resources and medical condition is at high risk for readmission.  He is agreeable to home health nursing service.  He currently does not have any services in the home or DME.  has completed his third cycle of chemo for lung cancer.  It is documented that his PET scan was negative.  No agency preference for home health.  Though random rotation, referral called to [facility] for SN and SW.  Found that patient has not been out of bed except to go to the bathroom.  Obtained order for physical therapy

## 2016-05-21 NOTE — Care Management Important Message (Signed)
Important Message  Patient Details  Name: Richard Jimenez MRN: [id number] Date of Birth: Jun 18, 1941   Medicare Important Message Given:  Yes    Katrina Stack, RN 05/21/2016, 10:27 AM

## 2016-05-21 NOTE — Progress Notes (Signed)
Pharmacy Antibiotic Note  Richard Jimenez is a 75 y.o. male admitted on 05/18/2016 with pneumonia.  Pharmacy has been consulted for levofloxacin and cefepime dosing.  Plan: Continue levofloxacin 500 mg IV q24h.  Continue cefepime 2 g iv q 8 hours.  Will discuss narrowing therapy to one antibiotic with MD.   Height: '5\' 9"'$  (175.3 cm) Weight: 182 lb 14.4 oz (83 kg) IBW/kg (Calculated) : 70.7  Temp (24hrs), Avg:97.9 F (36.6 C), Min:97.7 F (36.5 C), Max:98.2 F (36.8 C)   Recent Labs Lab 05/18/16 0313 05/19/16 0325 05/20/16 0420 05/21/16 0554  WBC 28.2* 22.2* 21.3* 17.7*  CREATININE 0.88  --  0.89  --     Estimated Creatinine Clearance: 72.8 mL/min (by C-G formula based on SCr of 0.89 mg/dL).    Allergies  Allergen Reactions  . Clarithromycin Nausea And Vomiting  . Penicillins Nausea And Vomiting, Rash and Other (See Comments)    Has patient had a PCN reaction causing immediate rash, facial/tongue/throat swelling, SOB or lightheadedness with hypotension: No Has patient had a PCN reaction causing severe rash involving mucus membranes or skin necrosis: No Has patient had a PCN reaction that required hospitalization No Has patient had a PCN reaction occurring within the last 10 years: No If all of the above answers are "NO", then may proceed with Cephalosporin use.   Antimicrobials this admission: cefepime 8/11 >>  azithomycin 8/11 >> 8/13 Levaquin 8/13 >>  Dose adjustments this admission:  Microbiology results:  8/11 BCx: Viridans strep 1/2, CNS 1/2 8/11 MRSA PCR: negative  Thank you for allowing pharmacy to be a part of this patient's care.  Ulice Dash D, PharmD Clinical Pharmacist 05/21/2016 12:40 PM

## 2016-05-22 LAB — GLUCOSE, CAPILLARY
Glucose-Capillary: 215 mg/dL — ABNORMAL HIGH (ref 65–99)
Glucose-Capillary: 258 mg/dL — ABNORMAL HIGH (ref 65–99)
Glucose-Capillary: 245 mg/dL — ABNORMAL HIGH (ref 65–99)
Glucose-Capillary: 273 mg/dL — ABNORMAL HIGH (ref 65–99)

## 2016-05-22 MED ORDER — METHYLPREDNISOLONE SODIUM SUCC 125 MG IJ SOLR
60.0000 mg | Freq: Two times a day (BID) | INTRAMUSCULAR | Status: DC
  Administered 2016-05-22 – 2016-05-23 (×2): 60 mg via INTRAVENOUS
  Filled 2016-05-22 (×2): qty 2

## 2016-05-22 NOTE — Progress Notes (Signed)
Inpatient Diabetes Program Recommendations  AACE/ADA: New Consensus Statement on Inpatient Glycemic Control (2015)  Target Ranges:  Prepandial:   less than 140 mg/dL      Peak postprandial:   less than 180 mg/dL (1-2 hours)      Critically ill patients:  140 - 180 mg/dL   Results for BRODY, BONNEAU (MRN [id number]) as of 05/22/2016 09:19  Ref. Range 05/21/2016 07:31 05/21/2016 12:16 05/21/2016 16:16 05/21/2016 20:48 05/22/2016 08:12  Glucose-Capillary Latest Ref Range: 65 - 99 mg/dL 193 (H) 219 (H) 216 (H) 259 (H) 215 (H)   Review of Glycemic Control  Diabetes history: DM2 Outpatient Diabetes medications: Metformin 1000 mg BID Current orders for Inpatient glycemic control: Novolog 0-9 units TID with meals, Novolog 0-5 units QHS  Inpatient Diabetes Program Recommendations: Insulin - Basal: Over the past 24 hours glucose has ranged from 193-259 mg/dl and patient has received a total of Novolog 11 units for correction. If steroids are continued as ordered, please consider ordering Lantus 5 units Q24H. Insulin - Meal Coverage: If steroids are continued, please consider ordering Novolog 3 units TID with meals for meal coverage.  Thanks, Barnie Alderman, RN, MSN, CDE Diabetes Coordinator Inpatient Diabetes Program [phone] (Team Pager from [place] to [place]) [phone] ([place] office) [phone] [facility] office) [phone] [facility] office)

## 2016-05-22 NOTE — Progress Notes (Signed)
SATURATION QUALIFICATIONS: (This note is used to comply with regulatory documentation for home oxygen)  Patient Saturations on Room Air at Rest = N/A  Patient Saturations on Room Air while Ambulating = N/A  Patient Saturations on 3 Liters of oxygen while Ambulating = 83% Patient Saturations on 4 Liters of oxygen while Ambulating = 90%  Please briefly explain why patient needs home oxygen:

## 2016-05-22 NOTE — Evaluation (Signed)
Physical Therapy Evaluation Patient Details Name: Richard Jimenez MRN: [id number] DOB: 1940-11-22 Today's Date: 05/22/2016   History of Present Illness  Pt admitted with acute respiratory failure with a coplex Hx including COPD, lung CA, CAD, DM2, CHF, among others  Clinical Impression  Richard Jimenez is a 75 y/o male limited by reliance on O2 and general deconditioning. He is independent with bed mobility, modified independent with transfers, and requires min assist with ambulation due to decreased dynamic balance and asymptomatic desaturation of blood O2 with exertion. After ambulating 50' on 3L O2 pt accidentally occluded his O2 line with his hand; O2 desaturated to 72% rapidly, but pt was asymptomatic. When PT instructed pt to release his O2 line O2 sats returned to 89% within ~30 seconds and pt returned to room with chair follow. Pt is appropriate for skilled PT at this time to address deficits in balance, coordination, endurance, gait, safety awareness, and safe use of DME. Recommend he receive OP PT to address deficits.    O2 saturation:   Rest:                96% @ 3L [place]                           Exertion:           89% @ 3L [place]                           Quiet standing: 72% and falling RA                                Follow Up Recommendations Outpatient PT    Equipment Recommendations  Rolling Belger with 5" wheels    Recommendations for Other Services       Precautions / Restrictions Precautions Precautions: Fall Precaution Comments: Pt has asymptomatic O2 desaturation, high fall risk, guard closely Restrictions Weight Bearing Restrictions: No      Mobility  Bed Mobility Overal bed mobility: Independent             General bed mobility comments: Pt required no assist to get to EOB with safe technique  Transfers Overall transfer level: Modified independent Equipment used: Rolling Kling (2 wheeled)             General transfer comment: Pt required no assist  to get to move from sit to stand, but was guarded for safety.   Ambulation/Gait Ambulation/Gait assistance: Min assist;+2 safety/equipment Ambulation Distance (Feet): 100 Feet Assistive device: Rolling Bergdoll (2 wheeled) Gait Pattern/deviations: Step-through pattern;Wide base of support;Drifts right/left;Staggering right;Staggering left     General Gait Details: With RW and min assist pt stumbles and has frequent minor LOB requiring step response. O2 desaturated from 96 at rest to 89 with 50' ambulation  Stairs            Wheelchair Mobility    Modified Rankin (Stroke Patients Only)       Balance Overall balance assessment: Modified Independent (Withstands moderate perturbation in sitting and standing)                                           Pertinent Vitals/Pain Pain Assessment: No/denies pain    Home Living Family/patient expects to  be discharged to:: Private residence Living Arrangements: Spouse/significant other Available Help at Discharge: Family Type of Home: House Home Access: Stairs to enter Entrance Stairs-Rails: Right Entrance Stairs-Number of Steps: 2 Home Layout: One level Home Equipment: None Additional Comments: has shower bar, but no shower seat    Prior Function Level of Independence: Independent         Comments: Pt was active in the community     Hand Dominance        Extremity/Trunk Assessment   Upper Extremity Assessment: Overall WFL for tasks assessed           Lower Extremity Assessment: Overall WFL for tasks assessed         Communication   Communication: No difficulties  Cognition Arousal/Alertness: Awake/alert Behavior During Therapy: WFL for tasks assessed/performed Overall Cognitive Status: Within Functional Limits for tasks assessed                      General Comments      Exercises General Exercises - Lower Extremity Ankle Circles/Pumps: 10 reps;Both;Strengthening;Seated  (with manual resistance and verbal cuing) Long Arc Quad: 10 reps;Both;Strengthening;Seated (with manual resistance and verbal cuing) Hip ABduction/ADduction: 10 reps;Both;AROM;Seated (min assist with tactile and verbal cuing) Other Exercises Other Exercises: Therapeutic activity training X 12 minutes including use of commode and standingbalance with dual tasking with CGA and cues for safe technique to prevent LOB; cuing for safe technique with hand hygiene      Assessment/Plan    PT Assessment Patient needs continued PT services  PT Diagnosis Difficulty walking;Abnormality of gait   PT Problem List Decreased range of motion;Decreased activity tolerance;Decreased balance;Decreased coordination;Decreased mobility;Decreased cognition;Decreased knowledge of use of DME;Decreased safety awareness;Decreased knowledge of precautions;Cardiopulmonary status limiting activity  PT Treatment Interventions DME instruction;Gait training;Stair training;Functional mobility training;Therapeutic activities;Therapeutic exercise;Balance training;Neuromuscular re-education;Cognitive remediation;Patient/family education   PT Goals (Current goals can be found in the Care Plan section) Acute Rehab PT Goals Patient Stated Goal: go home PT Goal Formulation: With patient Time For Goal Achievement: 06/05/16 Potential to Achieve Goals: Good    Frequency Min 2X/week   Barriers to discharge Inaccessible home environment Pt needs RW and O2 to ambulate safely at home    Co-evaluation               End of Session Equipment Utilized During Treatment: Gait belt Activity Tolerance: Patient tolerated treatment well Patient left: in chair;with call bell/phone within reach;with family/visitor present Nurse Communication: Mobility status;Precautions (Pt's O2 sats drop rapidly on RA to 72 asymptomatically )         Time: 2458-0998 PT Time Calculation (min) (ACUTE ONLY): 32 min   Charges:         PT G Codes:         Jaymeson Mengel 06/14/2016, 1:40 PM  Burnett Corrente, SPT [phone]

## 2016-05-22 NOTE — Progress Notes (Signed)
Alert and oriented. No complaints of pain. Vitals are stable. Qualified for home oxygen, saturation mid 90's on 3 liters at rest. Requiring 4 liters with ambulation. Patient states he feels much better breathing wise today. Will most likely discharge tomorrow.

## 2016-05-22 NOTE — Progress Notes (Signed)
Patient's BP early this AM was 97/46. Recheck was 116/56. Dr. Verdell Carmine on unit and stated to hold metoprolol this AM, give losartan.

## 2016-05-22 NOTE — Progress Notes (Signed)
[place] at [place] NAME: Richard Jimenez    MR#:  [id number]  DATE OF BIRTH:  08-Feb-1941  SUBJECTIVE:   Shortness of breath much improved.  Feels better. Wheezing improved.   REVIEW OF SYSTEMS:    Review of Systems  Constitutional: Negative for chills and fever.  HENT: Negative for congestion and tinnitus.   Eyes: Negative for blurred vision and double vision.  Respiratory: Positive for shortness of breath and wheezing. Negative for cough.   Cardiovascular: Negative for chest pain, orthopnea and PND.  Gastrointestinal: Negative for abdominal pain, diarrhea, nausea and vomiting.  Genitourinary: Negative for dysuria and hematuria.  Neurological: Negative for dizziness, sensory change and focal weakness.  All other systems reviewed and are negative.   Nutrition: Heart Healthy/Carb control Tolerating Diet: Yes Tolerating PT: Await Eval.   DRUG ALLERGIES:   Allergies  Allergen Reactions  . Clarithromycin Nausea And Vomiting  . Penicillins Nausea And Vomiting, Rash and Other (See Comments)    Has patient had a PCN reaction causing immediate rash, facial/tongue/throat swelling, SOB or lightheadedness with hypotension: No Has patient had a PCN reaction causing severe rash involving mucus membranes or skin necrosis: No Has patient had a PCN reaction that required hospitalization No Has patient had a PCN reaction occurring within the last 10 years: No If all of the above answers are "NO", then may proceed with Cephalosporin use.    VITALS:  Blood pressure (!) 123/55, pulse 99, temperature 98.5 F (36.9 C), temperature source Oral, resp. rate 19, height '5\' 9"'$  (1.753 m), weight 83.3 kg (183 lb 9.6 oz), SpO2 93 %.  PHYSICAL EXAMINATION:   Physical Exam  GENERAL:  75 y.o.-year-old patient lying in bed in mild resp. Distress.  EYES: Pupils equal, round, reactive to light and accommodation. No scleral icterus. Extraocular muscles intact.   HEENT: Head atraumatic, normocephalic. Oropharynx and nasopharynx clear.  NECK:  Supple, no jugular venous distention. No thyroid enlargement, no tenderness.  LUNGS: Prolonged insp & Exp phase. Minimal end-exp. Wheezing b/l. No use of accessory muscles of respiration. No rales, rhonchi CARDIOVASCULAR: S1, S2 normal. No murmurs, rubs, or gallops.  ABDOMEN: Soft, nontender, nondistended. Bowel sounds present. No organomegaly or mass.  EXTREMITIES: No cyanosis, clubbing or edema b/l.    NEUROLOGIC: Cranial nerves II through XII are intact. No focal Motor or sensory deficits b/l. Globally weak.    PSYCHIATRIC: The patient is alert and oriented x 3.  SKIN: No obvious rash, lesion, or ulcer.    LABORATORY PANEL:   CBC  Recent Labs Lab 05/21/16 0554  WBC 17.7*  HGB 11.6*  HCT 35.2*  PLT 137*   ------------------------------------------------------------------------------------------------------------------  Chemistries   Recent Labs Lab 05/18/16 0313 05/20/16 0420  NA 137 138  K 4.7 5.1  CL 101 99*  CO2 28 33*  GLUCOSE 248* 211*  BUN 16 28*  CREATININE 0.88 0.89  CALCIUM 8.8* 8.6*  AST 36  --   ALT 42  --   ALKPHOS 47  --   BILITOT 1.1  --    ------------------------------------------------------------------------------------------------------------------  Cardiac Enzymes  Recent Labs Lab 05/18/16 1927  TROPONINI 2.08*   ------------------------------------------------------------------------------------------------------------------  RADIOLOGY:  No results found.   ASSESSMENT AND PLAN:   75 year old male with past History of COPD, small cell lung cancer who presents to the hospital due to severe shortness of breath noted to be in COPD exacerbation.  1. Acute on chronic respiratory failure with hypoxia-secondary to COPD exacerbation. - much  improved since yesterday.  Continue intermittent BiPAP, Continue IV steroids but will taper, cont Pulmicort nebs,  scheduled DuoNeb's. -Continue empiric antibiotics with levaquin. BC + for Strep.   - CT chest obtained showing ?? Lymphangitis spread and appreciate Hem/Onc input and cont. Current care and no plans for any changes presently.      2. COPD exacerbation- the cause of patient's acute respiratory failure. -Continue IV steroids but will taper as improving, cont. scheduled DuoNeb's, Pulmicort nebs.  - cont. Empiric levaquin. Cont. Bipap.  Roxaonol for air hunger.    3. Elevated Troponin - demand ischemia (vs) NSTEMI.  Most likely supply demand ischemia from COPD Exacerbation and hypoxia.  - Echo showing some LV dysfunction but no plans for acute intervention as per Cardiology. - cont. ASA, Lovenox, Atorvastatin, B-blocker, losartan added due to LV dysfunction as per Cards.    4. Right IJ Thrombus - cont. lovenox SQ.    5. Diabetes type 2 without complication-continue sliding scale insulin. Blood sugars are a bit high due to steroids and will monitor.  Lower Steroids today.   6. Leukocytosis-suspect steroid mediated and trending down.   6. Essential hypertension-continue Toprol.  7. Hx of Lung Cancer - follows w/ Dr. Rogue Bussing.  - CT chest showing ?? Lymphangitic spread of tumor. Appreciate hematology oncology input and no plans for further intervention presently.  8. Sepsis-patient's blood cultures positive for strep viridans. Continue Levaquin, repeat blood cultures to make sure that clearing.  PT eval noted   All the records are reviewed and case discussed with Care Management/Social Workerr. Management plans discussed with the patient, family and they are in agreement.  CODE STATUS: Full  DVT Prophylaxis: Lovenox.  TOTAL TIME TAKING CARE OF THIS PATIENT: 30 minutes.   POSSIBLE D/C IN 1-2 DAYS, DEPENDING ON CLINICAL CONDITION.   Henreitta Leber M.D on 05/22/2016 at 4:24 PM  Between 7am to 6pm - Pager - [phone]  After 6pm go to [email or web address] - password [email or web address] [place] [facility]  Office  [phone]  CC: Primary care physician; Madelyn Brunner, MD

## 2016-05-22 NOTE — Plan of Care (Signed)
Problem: ICU Phase Progression Outcomes Goal: O2 sats trending toward baseline Outcome: Not Progressing Will need home O2

## 2016-05-23 LAB — CBC
HCT: 36.2 % — ABNORMAL LOW (ref 40.0–52.0)
Hemoglobin: 12 g/dL — ABNORMAL LOW (ref 13.0–18.0)
MCH: 29.3 pg (ref 26.0–34.0)
MCHC: 33.2 g/dL (ref 32.0–36.0)
MCV: 88.3 fL (ref 80.0–100.0)
PLATELETS: 100 10*3/uL — AB (ref 150–440)
RBC: 4.1 MIL/uL — AB (ref 4.40–5.90)
RDW: 16.6 % — AB (ref 11.5–14.5)
WBC: 4.1 10*3/uL (ref 3.8–10.6)

## 2016-05-23 LAB — CREATININE, SERUM
CREATININE: 0.77 mg/dL (ref 0.61–1.24)
GFR calc Af Amer: >60 mL/min (ref >60)

## 2016-05-23 LAB — GLUCOSE, CAPILLARY
Glucose-Capillary: 164 mg/dL — ABNORMAL HIGH (ref 65–99)
Glucose-Capillary: 351 mg/dL — ABNORMAL HIGH (ref 65–99)

## 2016-05-23 MED ORDER — ASPIRIN 81 MG PO CHEW: 81.0000 mg | CHEWABLE_TABLET | Freq: Every day | ORAL | 1 refills | Status: DC

## 2016-05-23 MED ORDER — LOSARTAN POTASSIUM 25 MG PO TABS: 25.0000 mg | ORAL_TABLET | Freq: Every day | ORAL | 1 refills | Status: DC

## 2016-05-23 MED ORDER — PREDNISONE 10 MG PO TABS: ORAL_TABLET | ORAL | 0 refills | Status: DC

## 2016-05-23 MED ORDER — METOPROLOL SUCCINATE ER 50 MG PO TB24: 50.0000 mg | ORAL_TABLET | Freq: Every day | ORAL | 1 refills | Status: DC

## 2016-05-23 MED ORDER — ATORVASTATIN CALCIUM 40 MG PO TABS: 40.0000 mg | ORAL_TABLET | Freq: Every day | ORAL | 1 refills | Status: DC

## 2016-05-23 MED ORDER — LEVOFLOXACIN 500 MG PO TABS: 500.0000 mg | ORAL_TABLET | Freq: Every day | ORAL | 0 refills | Status: DC

## 2016-05-23 MED ORDER — TIOTROPIUM BROMIDE MONOHYDRATE 18 MCG IN CAPS: 18.0000 ug | ORAL_CAPSULE | Freq: Every day | RESPIRATORY_TRACT | 12 refills | Status: DC

## 2016-05-23 MED ORDER — IPRATROPIUM-ALBUTEROL 0.5-2.5 (3) MG/3ML IN SOLN: 3.0000 mL | Freq: Four times a day (QID) | RESPIRATORY_TRACT | 0 refills | Status: AC | PRN

## 2016-05-23 NOTE — Progress Notes (Signed)
Physical Therapy Treatment Patient Details Name: Richard Jimenez MRN: [id number] DOB: 07/10/1941 Today's Date: 05/23/2016    History of Present Illness Pt admitted with acute respiratory failure with a coplex Hx including COPD, lung CA, CAD, DM2, CHF, among others    PT Comments    Pt had made significant progress toward goals. He was able to ambulate 240' with RW and CGA with O2 sats above 92% @ 2L O2. Pt had mild transient SOB after ambulation, but was still able to participate in ther-ex. Pt scored 9/12 on the modified DGI making him borderline for fall risk. He still has evident balance deficits with abnormally slow gait speed (2'/s) despite 5/5 strength. He will benefit from continued skilled therapy to address his residual deficits. Recommend HHPT.  Follow Up Recommendations  Home health PT     Equipment Recommendations  Rolling Bonelli with 5" wheels    Recommendations for Other Services       Precautions / Restrictions Precautions Precautions: Fall Restrictions Weight Bearing Restrictions: No    Mobility  Bed Mobility Overal bed mobility: Independent             General bed mobility comments: Pt required no assist to get to EOB with safe technique  Transfers Overall transfer level: Independent               General transfer comment: Pt required no assist to get to move from sit to stand, but was guarded for safety.   Ambulation/Gait Ambulation/Gait assistance: Min guard Ambulation Distance (Feet): 240 Feet Assistive device: Rolling Blackson (2 wheeled) Gait Pattern/deviations: Wide base of support;Decreased stride length;Step-through pattern Gait velocity: 10' in 5 seconds Gait velocity interpretation: Below normal speed for age/gender General Gait Details: With RW CGA pt ambulates safely with minor drifting and change in speed with dual tasking. Balance deficits still evident   Stairs            Wheelchair Mobility    Modified Rankin (Stroke  Patients Only)       Balance Overall balance assessment: Needs assistance Sitting-balance support: Feet supported;No upper extremity supported Sitting balance-Leahy Scale: Normal     Standing balance support: No upper extremity supported Standing balance-Leahy Scale: Fair Standing balance comment: Pt can tolerate limited perturbation without UE support.                     Cognition Arousal/Alertness: Awake/alert Behavior During Therapy: WFL for tasks assessed/performed Overall Cognitive Status: Within Functional Limits for tasks assessed                      Exercises General Exercises - Lower Extremity Ankle Circles/Pumps: 15 reps;Both;Strengthening;Seated (with manual resist and verbal cues) Long Arc Quad: 15 reps;Both;Strengthening;Seated (with manual resist and verbal cues) Hip ABduction/ADduction: 15 reps;Both;AROM;Seated (with tactile and verbal cues)    General Comments        Pertinent Vitals/Pain Pain Assessment: No/denies pain    Home Living                      Prior Function            PT Goals (current goals can now be found in the care plan section) Acute Rehab PT Goals Patient Stated Goal: go home PT Goal Formulation: With patient Time For Goal Achievement: 06/05/16 Potential to Achieve Goals: Good Progress towards PT goals: Progressing toward goals    Frequency  Min 2X/week    PT  Plan Discharge plan needs to be updated    Co-evaluation             End of Session Equipment Utilized During Treatment: Gait belt;Oxygen Activity Tolerance: Patient tolerated treatment well Patient left: in chair;with call bell/phone within reach;with chair alarm set;with family/visitor present     Time: 6546-5035 PT Time Calculation (min) (ACUTE ONLY): 18 min  Charges:                       G Codes:      Franco Duley 2016-05-25, 12:07 PM  Burnett Corrente, SPT [phone]

## 2016-05-23 NOTE — Progress Notes (Signed)
Patient already has a nebulizer at home and home health agency will be delivering a [place] to the patient's house so he does not have to wait in the hospital for it.

## 2016-05-23 NOTE — Progress Notes (Signed)
Inpatient Diabetes Program Recommendations  AACE/ADA: New Consensus Statement on Inpatient Glycemic Control (2015)  Target Ranges:  Prepandial:   less than 140 mg/dL      Peak postprandial:   less than 180 mg/dL (1-2 hours)      Critically ill patients:  140 - 180 mg/dL   Results for Richard Jimenez, Richard Jimenez (MRN [id number]) as of 05/23/2016 10:13  Ref. Range 05/22/2016 08:12 05/22/2016 11:38 05/22/2016 16:25 05/22/2016 21:04 05/23/2016 07:37  Glucose-Capillary Latest Ref Range: 65 - 99 mg/dL 215 (H) 258 (H) 245 (H) 273 (H) 164 (H)   Review of Glycemic Control  Diabetes history:DM2 Outpatient Diabetes medications: Metformin 1000 mg BID Current orders for Inpatient glycemic control: Novolog 0-9 units TID with meals, Novolog 0-5 units QHS  Inpatient Diabetes Program Recommendations: Insulin - Meal Coverage: Note steroids were decreased but anticipate elevated postprandial glucose with steroids. If steroids are continued, please consider ordering Novolog 3 units TID with meals for meal coverage.  Thanks, Barnie Alderman, RN, MSN, CDE Diabetes Coordinator Inpatient Diabetes Program [phone] (Team Pager from [place] to [place]) [phone] ([place] office) [phone] [facility] office) [phone] [facility] office)

## 2016-05-23 NOTE — Discharge Summary (Signed)
[facility] NAME: Richard Jimenez    MR#:  [id number]  DATE OF BIRTH:  22-Jun-1941  DATE OF ADMISSION:  05/18/2016 ADMITTING PHYSICIAN: Harrie Foreman, MD  DATE OF DISCHARGE: 05/23/2016  2:52 PM  PRIMARY CARE PHYSICIAN: Madelyn Brunner, MD    ADMISSION DIAGNOSIS:  Leukocytosis [D72.829] Respiratory distress [R06.00] Hypoxia [R09.02] COPD exacerbation (HCC) [J44.1] Elevated troponin I level [R79.89]  DISCHARGE DIAGNOSIS:  Principal Problem:   Acute respiratory failure (HCC) Active Problems:   Lung cancer, upper lobe (HCC)   COPD exacerbation (HCC)   Jugular vein thrombosis, right   NSTEMI (non-ST elevated myocardial infarction) (HCC)   Elevated troponin I level   Hypoxia   Shortness of breath   SECONDARY DIAGNOSIS:   Past Medical History:  Diagnosis Date  . Asthma   . CML (chronic myelocytic leukemia) ([place])   . COPD (chronic obstructive pulmonary disease) ([place])   . Diabetes mellitus type II, controlled ([place])   . Diabetes mellitus without complication ([place])   . Hypertension   . 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 Lung cancer, upper lobe ([place]) 2009  . Myocardial infarct ([place])   . Small cell lung cancer [facility])     HOSPITAL COURSE:   75 year old male with past History of COPD, small cell lung cancer who presents to the hospital due to severe shortness of breath noted to be in COPD exacerbation.  1. Acute on chronic respiratory failure with hypoxia-secondary to COPD exacerbation. -Initially patient was admitted to the ICU and placed on BiPAP. He received BiPAP treatment in the throughout the hospital course but over the past 48 hours she's been off it. -He was treated aggressively with IV steroids, Pulmicort nebs, scheduled DuoNeb's. He was also given empiric antibiotics with Levaquin. -He has significantly improved since admission and having discharged on oral prednisone taper, Levaquin. -he did qualify for  home oxygen which was arranged for him prior to discharge.   2. COPD exacerbation- this was the cause of patient's acute respiratory failure. -Patient was treated aggressively with IV steroids, scheduled DuoNeb's, Pulmicort nebs empiric antibiotics and antitussives flutter valve. -He has significantly improved since admission. He is now being discharged on oral prednisone taper, Levaquin, DuoNeb's as needed at home and also empiric Levaquin.  -He was also arranged for home oxygen prior to discharge.    3. Elevated Troponin - this was supply demand ischemia from COPD Exacerbation and hypoxia.  - Echo showing some LV dysfunction and patient was seen by cardiology who did not recommend any acute intervention presently. -He was maintained on ASA, Lovenox, Atorvastatin, B-blocker, losartan and is being discharged on that.  4. Right IJ Thrombus - he will cont. lovenox SQ.    5. DM Type II - he was SSI while in the hospital and now being discharged on Oral Metformin.   6. Leukocytosis-suspect steroid mediated and trending down and normalized upon discharge.    7. Essential hypertension-continue Toprol, Losartan  8. Hx of Lung Cancer - follows w/ Dr. Rogue Bussing.  - CT chest while in hospital showing ?? Lymphangitic spread of tumor. Seen by hematology oncology input and no plans for further intervention presently. Follow up with them as outpatient.   9. Sepsis-ruled out.  Patient's blood cultures grew coag-negative staph and strep reviewed as it is likely contaminant. -Patient was discharged on oral Levaquin empirically.  Patient was discharged home with home health nursing, physical therapy.  DISCHARGE CONDITIONS:   Stable  CONSULTS OBTAINED:  Treatment Team:  Wellington Hampshire, MD Cammie Sickle, MD  DRUG ALLERGIES:   Allergies  Allergen Reactions  . Clarithromycin Nausea And Vomiting  . Penicillins Nausea And Vomiting, Rash and Other (See Comments)    Has patient had  a PCN reaction causing immediate rash, facial/tongue/throat swelling, SOB or lightheadedness with hypotension: No Has patient had a PCN reaction causing severe rash involving mucus membranes or skin necrosis: No Has patient had a PCN reaction that required hospitalization No Has patient had a PCN reaction occurring within the last 10 years: No If all of the above answers are "NO", then may proceed with Cephalosporin use.    DISCHARGE MEDICATIONS:     Medication List    STOP taking these medications   apixaban 5 MG Tabs tablet Commonly known as:  ELIQUIS   magic mouthwash Soln   nicotine 14 mg/24hr patch Commonly known as:  NICODERM CQ - dosed in mg/24 hours   prochlorperazine 10 MG tablet Commonly known as:  COMPAZINE   traMADol 50 MG tablet Commonly known as:  ULTRAM     TAKE these medications   aspirin 81 MG chewable tablet Chew 1 tablet (81 mg total) by mouth daily.   atorvastatin 40 MG tablet Commonly known as:  LIPITOR Take 1 tablet (40 mg total) by mouth daily at 6 PM.   enoxaparin 80 MG/0.8ML injection Commonly known as:  LOVENOX Inject 0.8 mLs (80 mg total) into the skin every 12 (twelve) hours.   ipratropium-albuterol 0.5-2.5 (3) MG/3ML Soln Commonly known as:  DUONEB Take 3 mLs by nebulization every 6 (six) hours as needed (shortness of breath).   levofloxacin 500 MG tablet Commonly known as:  LEVAQUIN Take 1 tablet (500 mg total) by mouth daily.   lidocaine-prilocaine cream Commonly known as:  EMLA Apply 1 application topically as needed (prior to accessing port).   losartan 25 MG tablet Commonly known as:  COZAAR Take 1 tablet (25 mg total) by mouth daily.   metFORMIN 1000 MG tablet Commonly known as:  GLUCOPHAGE Take 1,000 mg by mouth 2 (two) times daily with a meal.   metoprolol succinate 50 MG 24 hr tablet Commonly known as:  TOPROL-XL Take 1 tablet (50 mg total) by mouth daily. Take with or immediately following a meal.   predniSONE 10  MG tablet Commonly known as:  DELTASONE Label  & dispense according to the schedule below. 5 Pills PO for 2 days then, 4 Pills PO for 2 days, 3 Pills PO for 2 days, 2 Pills PO for 2 days, 1 Pill PO for 2 days then STOP.   PROAIR HFA 108 (90 Base) MCG/ACT inhaler Generic drug:  albuterol Inhale 2 puffs into the lungs every 6 (six) hours as needed for wheezing or shortness of breath.   albuterol (2.5 MG/3ML) 0.083% nebulizer solution Commonly known as:  PROVENTIL Inhale 3 mLs into the lungs every 6 (six) hours as needed for wheezing or shortness of breath.   tiotropium 18 MCG inhalation capsule Commonly known as:  SPIRIVA Place 1 capsule (18 mcg total) into inhaler and inhale daily.         DISCHARGE INSTRUCTIONS:   DIET:  Cardiac diet and Diabetic diet  DISCHARGE CONDITION:  Stable  ACTIVITY:  Activity as tolerated  OXYGEN:  Home Oxygen: Yes.     Oxygen Delivery: 2 liters/min via Patient connected to nasal cannula oxygen  DISCHARGE LOCATION:  Home with Home health PT, RN, Social Work  If you experience worsening of your admission symptoms, develop shortness of breath, life threatening emergency, suicidal or homicidal thoughts you must seek medical attention immediately by calling 911 or calling your MD immediately  if symptoms less severe.  You Must read complete instructions/literature along with all the possible adverse reactions/side effects for all the Medicines you take and that have been prescribed to you. Take any new Medicines after you have completely understood and accpet all the possible adverse reactions/side effects.   Please note  You were cared for by a hospitalist during your hospital stay. If you have any questions about your discharge medications or the care you received while you were in the hospital after you are discharged, you can call the unit and asked to speak with the hospitalist on call if the hospitalist that took care of you is not available.  Once you are discharged, your primary care physician will handle any further medical issues. Please note that NO REFILLS for any discharge medications will be authorized once you are discharged, as it is imperative that you return to your primary care physician (or establish a relationship with a primary care physician if you do not have one) for your aftercare needs so that they can reassess your need for medications and monitor your lab values.     Today   Shortness of breath much improved.  No chest pain and no complaints presently.    VITAL SIGNS:  Blood pressure 117/88, pulse 70, temperature 97.9 F (36.6 C), temperature source Oral, resp. rate 20, height '5\' 9"'$  (1.753 m), weight 82 kg (180 lb 11.2 oz), SpO2 94 %.  I/O:   Intake/Output Summary (Last 24 hours) at 05/23/16 1654 Last data filed at 05/23/16 1424  Gross per 24 hour  Intake              243 ml  Output             1300 ml  Net            -1057 ml    PHYSICAL EXAMINATION:   GENERAL:  75 y.o.-year-old patient lying in bed in mild resp. Distress.  EYES: Pupils equal, round, reactive to light and accommodation. No scleral icterus. Extraocular muscles intact.  HEENT: Head atraumatic, normocephalic. Oropharynx and nasopharynx clear.  NECK:  Supple, no jugular venous distention. No thyroid enlargement, no tenderness.  LUNGS: Prolonged insp & Exp phase. Minimal  Wheezing b/l. No use of accessory muscles of respiration. No rales, rhonchi CARDIOVASCULAR: S1, S2 normal. No murmurs, rubs, or gallops.  ABDOMEN: Soft, nontender, nondistended. Bowel sounds present. No organomegaly or mass.  EXTREMITIES: No cyanosis, clubbing or edema b/l.    NEUROLOGIC: Cranial nerves II through XII are intact. No focal Motor or sensory deficits b/l. Globally weak.    PSYCHIATRIC: The patient is alert and oriented x 3.  SKIN: No obvious rash, lesion, or ulcer.   DATA REVIEW:   CBC  Recent Labs Lab 05/23/16 0533  WBC 4.1  HGB 12.0*  HCT  36.2*  PLT 100*    Chemistries   Recent Labs Lab 05/18/16 0313 05/20/16 0420 05/23/16 0533  NA 137 138  --   K 4.7 5.1  --   CL 101 99*  --   CO2 28 33*  --   GLUCOSE 248* 211*  --   BUN 16 28*  --   CREATININE 0.88 0.89 0.77  CALCIUM 8.8* 8.6*  --   AST 36  --   --  ALT 42  --   --   ALKPHOS 47  --   --   BILITOT 1.1  --   --     Cardiac Enzymes  Recent Labs Lab 05/18/16 1927  TROPONINI 2.08*    Microbiology Results  Results for orders placed or performed during the hospital encounter of 05/18/16  Blood culture (routine x 2)     Status: Abnormal   Collection Time: 05/18/16  3:13 AM  Result Value Ref Range Status   Specimen Description BLOOD RIGHT ANTECUBITAL  Final   Special Requests BOTTLES DRAWN AEROBIC AND ANAEROBIC 4CC  Final   Culture  Setup Time   Final    GRAM POSITIVE COCCI AEROBIC BOTTLE ONLY CRITICAL RESULT CALLED TO, READ BACK BY AND VERIFIED WITH: MATT MCBANE AT 2130 ON 05/19/16 [facility] CONFIRMED BY [facility]    Culture (A)  Final    STAPHYLOCOCCUS SPECIES (COAGULASE NEGATIVE) THE SIGNIFICANCE OF ISOLATING THIS ORGANISM FROM A SINGLE SET OF BLOOD CULTURES WHEN MULTIPLE SETS ARE DRAWN IS UNCERTAIN. PLEASE NOTIFY THE MICROBIOLOGY DEPARTMENT WITHIN ONE WEEK IF SPECIATION AND SENSITIVITIES ARE REQUIRED. Performed at [facility]    Report Status 05/21/2016 FINAL  Final  Blood culture (routine x 2)     Status: Abnormal   Collection Time: 05/18/16  3:52 AM  Result Value Ref Range Status   Specimen Description BLOOD LEFT ANTECUBITAL  Final   Special Requests BOTTLES DRAWN AEROBIC AND ANAEROBIC 5CC  Final   Culture  Setup Time   Final    GRAM POSITIVE COCCI ANAEROBIC BOTTLE ONLY CRITICAL RESULT CALLED TO, READ BACK BY AND VERIFIED WITH: MATT  05/18/16 @ 2214  [place]    Culture (A)  Final    VIRIDANS STREPTOCOCCUS THE SIGNIFICANCE OF ISOLATING THIS ORGANISM FROM A SINGLE SET OF BLOOD CULTURES WHEN MULTIPLE SETS ARE DRAWN IS UNCERTAIN. PLEASE NOTIFY  THE MICROBIOLOGY DEPARTMENT WITHIN ONE WEEK IF SPECIATION AND SENSITIVITIES ARE REQUIRED. Performed at [facility]    Report Status 05/21/2016 FINAL  Final  Blood Culture ID Panel (Reflexed)     Status: Abnormal   Collection Time: 05/18/16  3:52 AM  Result Value Ref Range Status   Enterococcus species NOT DETECTED NOT DETECTED Final   Vancomycin resistance NOT DETECTED NOT DETECTED Final   Listeria monocytogenes NOT DETECTED NOT DETECTED Final   Staphylococcus species NOT DETECTED NOT DETECTED Final   Staphylococcus aureus NOT DETECTED NOT DETECTED Final   Methicillin resistance NOT DETECTED NOT DETECTED Final   Streptococcus species DETECTED (A) NOT DETECTED Final    Comment: CRITICAL RESULT CALLED TO, READ BACK BY AND VERIFIED WITH: MATT MCBANE 05/18/16 @ 2214    Streptococcus agalactiae NOT DETECTED NOT DETECTED Final   Streptococcus pneumoniae NOT DETECTED NOT DETECTED Final   Streptococcus pyogenes NOT DETECTED NOT DETECTED Final   Acinetobacter baumannii NOT DETECTED NOT DETECTED Final   Enterobacteriaceae species NOT DETECTED NOT DETECTED Final   Enterobacter cloacae complex NOT DETECTED NOT DETECTED Final   Escherichia coli NOT DETECTED NOT DETECTED Final   Klebsiella oxytoca NOT DETECTED NOT DETECTED Final   Klebsiella pneumoniae NOT DETECTED NOT DETECTED Final   Proteus species NOT DETECTED NOT DETECTED Final   Serratia marcescens NOT DETECTED NOT DETECTED Final   Carbapenem resistance NOT DETECTED NOT DETECTED Final   Haemophilus influenzae NOT DETECTED NOT DETECTED Final   Neisseria meningitidis NOT DETECTED NOT DETECTED Final   Pseudomonas aeruginosa NOT DETECTED NOT DETECTED Final   Candida albicans NOT DETECTED NOT  DETECTED Final   Candida glabrata NOT DETECTED NOT DETECTED Final   Candida krusei NOT DETECTED NOT DETECTED Final   Candida parapsilosis NOT DETECTED NOT DETECTED Final   Candida tropicalis NOT DETECTED NOT DETECTED Final  MRSA PCR Screening      Status: None   Collection Time: 05/18/16 10:00 AM  Result Value Ref Range Status   MRSA by PCR NEGATIVE NEGATIVE Final    Comment:        The GeneXpert MRSA Assay (FDA approved for NASAL specimens only), is one component of a comprehensive MRSA colonization surveillance program. It is not intended to diagnose MRSA infection nor to guide or monitor treatment for MRSA infections.   CULTURE, BLOOD (ROUTINE X 2) w Reflex to ID Panel     Status: None (Preliminary result)   Collection Time: 05/22/16  5:04 PM  Result Value Ref Range Status   Specimen Description BLOOD RT FA  Final   Special Requests BOTTLES DRAWN AEROBIC AND ANAEROBIC 10CC  Final   Culture NO GROWTH < 24 HOURS  Final   Report Status PENDING  Incomplete  CULTURE, BLOOD (ROUTINE X 2) w Reflex to ID Panel     Status: None (Preliminary result)   Collection Time: 05/22/16  5:04 PM  Result Value Ref Range Status   Specimen Description BLOOD RT UPPER FA  Final   Special Requests BOTTLES DRAWN AEROBIC AND ANAEROBIC 10CC  Final   Culture NO GROWTH < 24 HOURS  Final   Report Status PENDING  Incomplete    RADIOLOGY:  No results found.    Management plans discussed with the patient, family and they are in agreement.  CODE STATUS:     Code Status Orders        Start     Ordered   05/18/16 0708  Do not attempt resuscitation (DNR)  Continuous    Question Answer Comment  In the event of cardiac or respiratory ARREST Do not call a "code blue"   In the event of cardiac or respiratory ARREST Do not perform Intubation, CPR, defibrillation or ACLS   In the event of cardiac or respiratory ARREST Use medication by any route, position, wound care, and other measures to relive pain and suffering. May use oxygen, suction and manual treatment of airway obstruction as needed for comfort.      05/18/16 0707    Code Status History    Date Active Date Inactive Code Status Order ID Comments User Context   05/18/2016  7:07 AM  05/18/2016  3:57 PM DNR [id number]  Harrie Foreman, MD ED   05/18/2016  4:12 AM 05/18/2016  7:07 AM DNR [id number]  Earleen Newport, MD ED   04/18/2016  5:35 PM 04/20/2016  2:33 PM Partial Code [id number]  Demetrios Loll, MD Inpatient   04/18/2016  5:23 PM 04/18/2016  5:35 PM Full Code [id number]  Demetrios Loll, MD Inpatient   04/06/2016  9:33 PM 04/09/2016  5:34 PM Full Code [id number]  Theodoro Grist, MD Inpatient    Questions for Most Recent Historical Code Status (Order [id number])    Question Answer Comment   In the event of cardiac or respiratory ARREST Do not call a "code blue"    In the event of cardiac or respiratory ARREST Do not perform Intubation, CPR, defibrillation or ACLS    In the event of cardiac or respiratory ARREST Use medication by any route, position, wound care, and other measures to relive pain and suffering. May use oxygen,  suction and manual treatment of airway obstruction as needed for comfort.       TOTAL TIME TAKING CARE OF THIS PATIENT: 40 minutes.    Henreitta Leber M.D on 05/23/2016 at 4:54 PM  Between 7am to 6pm - Pager - [phone]  After 6pm go to [email or web address] - password [email or web address] [facility]  Office  [phone]  CC: Primary care physician; Madelyn Brunner, MD

## 2016-05-23 NOTE — Progress Notes (Signed)
Oxygen tank has arrived for discharge. Discharge instructions given to patient. IV's and tele removed. Questions answered and patient verbalized understanding. Prescriptions given to patient as well as education.

## 2016-05-23 NOTE — Care Management (Signed)
Home O2, nebulizer and [place] ordered from [place]. [facility] notified of discharge. Patient updated on discharge plan.

## 2016-05-24 ENCOUNTER — Inpatient Hospital Stay: Payer: Medicare Other

## 2016-05-25 ENCOUNTER — Other Ambulatory Visit: Payer: Self-pay | Admitting: Internal Medicine

## 2016-05-25 ENCOUNTER — Telehealth: Payer: Self-pay

## 2016-05-25 ENCOUNTER — Telehealth: Payer: Self-pay | Admitting: Internal Medicine

## 2016-05-25 MED ORDER — RIVAROXABAN (XARELTO) VTE STARTER PACK (15 & 20 MG): ORAL_TABLET | ORAL | 0 refills | Status: DC

## 2016-05-25 NOTE — Telephone Encounter (Signed)
Spoke with pt's wife and daughter in clinic today.  I called pt's wife earlier per Dr. b to check and see if pt was dc'd on Lovenox.  Pt's wife verbalized that he has not had any blood thinners since being dc'd from hospital other than 81 mg of aspirin. Dr. Jacinto Reap ordered xaerlto 15 mg twice daily.   Day 1-21.  On day 22 pt to start 20 mg.  Pt daughter verbalized an understanding and per Dr. B he would like pt to start today.  Pt's family advised do to financial burden to call us when close to the end of pack to see if we still have samples of 20 mg available.

## 2016-05-25 NOTE — Telephone Encounter (Signed)
RN spoke to pt's wife re: starting xarelto [caanot afford lovenox sec to financial issues]. Dr.B

## 2016-05-27 LAB — CULTURE, BLOOD (ROUTINE X 2)
CULTURE: NO GROWTH
Culture: NO GROWTH

## 2016-05-28 NOTE — Telephone Encounter (Signed)
Erroneous note encounter  

## 2016-06-04 ENCOUNTER — Encounter (INDEPENDENT_AMBULATORY_CARE_PROVIDER_SITE_OTHER): Payer: Self-pay

## 2016-06-04 ENCOUNTER — Inpatient Hospital Stay: Payer: Medicare Other

## 2016-06-04 ENCOUNTER — Inpatient Hospital Stay (HOSPITAL_BASED_OUTPATIENT_CLINIC_OR_DEPARTMENT_OTHER): Payer: Medicare Other | Admitting: Internal Medicine

## 2016-06-04 VITALS — BP 116/72 | HR 80 | Temp 98.4°F | Resp 18 | Wt 186.5 lb

## 2016-06-04 DIAGNOSIS — Z7689 Persons encountering health services in other specified circumstances: Secondary | ICD-10-CM | POA: Diagnosis not present

## 2016-06-04 DIAGNOSIS — F17210 Nicotine dependence, cigarettes, uncomplicated: Secondary | ICD-10-CM | POA: Diagnosis not present

## 2016-06-04 DIAGNOSIS — R05 Cough: Secondary | ICD-10-CM

## 2016-06-04 DIAGNOSIS — C3412 Malignant neoplasm of upper lobe, left bronchus or lung: Secondary | ICD-10-CM

## 2016-06-04 DIAGNOSIS — Z79899 Other long term (current) drug therapy: Secondary | ICD-10-CM | POA: Diagnosis not present

## 2016-06-04 DIAGNOSIS — Z923 Personal history of irradiation: Secondary | ICD-10-CM

## 2016-06-04 DIAGNOSIS — Z5111 Encounter for antineoplastic chemotherapy: Secondary | ICD-10-CM | POA: Diagnosis present

## 2016-06-04 DIAGNOSIS — J449 Chronic obstructive pulmonary disease, unspecified: Secondary | ICD-10-CM

## 2016-06-04 DIAGNOSIS — I10 Essential (primary) hypertension: Secondary | ICD-10-CM | POA: Diagnosis not present

## 2016-06-04 DIAGNOSIS — E119 Type 2 diabetes mellitus without complications: Secondary | ICD-10-CM | POA: Diagnosis not present

## 2016-06-04 DIAGNOSIS — Z7901 Long term (current) use of anticoagulants: Secondary | ICD-10-CM | POA: Diagnosis not present

## 2016-06-04 DIAGNOSIS — I252 Old myocardial infarction: Secondary | ICD-10-CM | POA: Diagnosis not present

## 2016-06-04 DIAGNOSIS — Z800 Family history of malignant neoplasm of digestive organs: Secondary | ICD-10-CM

## 2016-06-04 DIAGNOSIS — C3492 Malignant neoplasm of unspecified part of left bronchus or lung: Secondary | ICD-10-CM

## 2016-06-04 DIAGNOSIS — R918 Other nonspecific abnormal finding of lung field: Secondary | ICD-10-CM

## 2016-06-04 DIAGNOSIS — J45909 Unspecified asthma, uncomplicated: Secondary | ICD-10-CM | POA: Diagnosis not present

## 2016-06-04 DIAGNOSIS — Z86718 Personal history of other venous thrombosis and embolism: Secondary | ICD-10-CM

## 2016-06-04 DIAGNOSIS — R0789 Other chest pain: Secondary | ICD-10-CM | POA: Diagnosis not present

## 2016-06-04 DIAGNOSIS — Z7984 Long term (current) use of oral hypoglycemic drugs: Secondary | ICD-10-CM

## 2016-06-04 LAB — COMPREHENSIVE METABOLIC PANEL
ALK PHOS: 50 U/L (ref 38–126)
ALT: 21 U/L (ref 17–63)
ANION GAP: 9 (ref 5–15)
AST: 18 U/L (ref 15–41)
Albumin: 4 g/dL (ref 3.5–5.0)
BILIRUBIN TOTAL: 0.5 mg/dL (ref 0.3–1.2)
BUN: 14 mg/dL (ref 6–20)
CALCIUM: 8.9 mg/dL (ref 8.9–10.3)
CO2: 28 mmol/L (ref 22–32)
CREATININE: 0.83 mg/dL (ref 0.61–1.24)
Chloride: 98 mmol/L — ABNORMAL LOW (ref 101–111)
Glucose, Bld: 151 mg/dL — ABNORMAL HIGH (ref 65–99)
Potassium: 4.5 mmol/L (ref 3.5–5.1)
SODIUM: 135 mmol/L (ref 135–145)
TOTAL PROTEIN: 7 g/dL (ref 6.5–8.1)

## 2016-06-04 LAB — CBC WITH DIFFERENTIAL/PLATELET
BASOS PCT: 1 %
Basophils Absolute: 0 10*3/uL (ref 0–0.1)
EOS ABS: 0 10*3/uL (ref 0–0.7)
Eosinophils Relative: 1 %
HCT: 35.5 % — ABNORMAL LOW (ref 40.0–52.0)
HEMOGLOBIN: 12.2 g/dL — AB (ref 13.0–18.0)
LYMPHS ABS: 1.3 10*3/uL (ref 1.0–3.6)
Lymphocytes Relative: 15 %
MCH: 29.8 pg (ref 26.0–34.0)
MCHC: 34.4 g/dL (ref 32.0–36.0)
MCV: 86.5 fL (ref 80.0–100.0)
MONO ABS: 0.5 10*3/uL (ref 0.2–1.0)
MONOS PCT: 6 %
Neutro Abs: 6.7 10*3/uL — ABNORMAL HIGH (ref 1.4–6.5)
Neutrophils Relative %: 77 %
Platelets: 172 10*3/uL (ref 150–440)
RBC: 4.11 MIL/uL — ABNORMAL LOW (ref 4.40–5.90)
RDW: 17.6 % — AB (ref 11.5–14.5)
WBC: 8.6 10*3/uL (ref 3.8–10.6)

## 2016-06-04 MED ORDER — SODIUM CHLORIDE 0.9 % IV SOLN
527.5000 mg | Freq: Once | INTRAVENOUS | Status: AC
  Administered 2016-06-04: 530 mg via INTRAVENOUS
  Filled 2016-06-04: qty 53

## 2016-06-04 MED ORDER — SODIUM CHLORIDE 0.9 % IV SOLN
Freq: Once | INTRAVENOUS | Status: AC
  Administered 2016-06-04: 11:00:00 via INTRAVENOUS
  Filled 2016-06-04: qty 1000

## 2016-06-04 MED ORDER — PALONOSETRON HCL INJECTION 0.25 MG/5ML
0.2500 mg | Freq: Once | INTRAVENOUS | Status: AC
  Administered 2016-06-04: 0.25 mg via INTRAVENOUS
  Filled 2016-06-04: qty 5

## 2016-06-04 MED ORDER — SODIUM CHLORIDE 0.9 % IV SOLN
10.0000 mg | Freq: Once | INTRAVENOUS | Status: AC
  Administered 2016-06-04: 10 mg via INTRAVENOUS
  Filled 2016-06-04: qty 1

## 2016-06-04 MED ORDER — SODIUM CHLORIDE 0.9 % IV SOLN
100.0000 mg/m2 | Freq: Once | INTRAVENOUS | Status: AC
  Administered 2016-06-04: 210 mg via INTRAVENOUS
  Filled 2016-06-04: qty 10.5

## 2016-06-04 NOTE — Assessment & Plan Note (Addendum)
#   Small cell lung cancer- Extensive stage small cell lung cancer- s/p # 3 cycle. Most recent CT scan in the hospital did not show any worsening left upper lung disease; interstitial thickening question lymphangitic spread. Clinically this is less likely as patient since patient has improved.   # proceed with carbo-etop # 4 days. Labs are adequate. Discussed re: 6 cycles- pt declines.   #  Improved/ Neck pain- improved;  S/p thrombolysis; on xarelto; patient will likely need lifelong anti-coagulation.  # labs in 4 weeks/ will order CT scan fr mid- October 2017.

## 2016-06-04 NOTE — Progress Notes (Signed)
Dr. Raul Del gave patient sample of Anoro inhaler.  Patient states his mouth is broken out around his upper lip. He has discontinued use.

## 2016-06-04 NOTE — Progress Notes (Signed)
[facility] [facility] OFFICE PROGRESS NOTE  Patient Care Team: Madelyn Brunner, MD as PCP - General (Internal Medicine)   SUMMARY OF ONCOLOGIC HISTORY:  Oncology History   # April-MAY 2017 SMALL CELL LUNG CANCER EXTENSIVE STAGE  LUL #1 4.1x 3.5cm;#2- 3.3x3.0 #3 Left pleural based nodule 81m [new]; Left apical radiation changes. 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   Patient was recently admitted hospital for COPD exacerbation- is currently home. He is not on oxygen. He has not smoked for the last few weeks. Continues to cough but no hemoptysis. Chronic shortness of breath is not any worse.  Patient states his headaches are improved. Denies any neck pain; denies any significant headaches. He continues to be on Xarelto.Patient not taking any pain medication for this left chest wall pain/resolved.  No fevers or chills. No hoarseness of voice.  REVIEW OF SYSTEMS:  A complete 10 point review of system is done which is negative except mentioned above/history of present illness.   PAST MEDICAL HISTORY :  Past Medical History:  Diagnosis Date  . Asthma   . CML (chronic  myelocytic leukemia) ([facility]   . COPD (chronic obstructive pulmonary disease) ([facility]   . Diabetes mellitus type II, controlled (HR[place]   . Diabetes mellitus without complication (HN[place]   . Hypertension   . 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 Lung cancer, upper lobe (HP[place] 2009  . Myocardial infarct (H[place]   . Small cell lung cancer (HB[facility]     PAST SURGICAL HISTORY :   Past Surgical History:  Procedure Laterality Date  . PERIPHERAL VASCULAR CATHETERIZATION N/A 02/21/2016   Procedure: PGlori LuisCath Insertion;  Surgeon: JAlgernon Huxley MD;  [facility];  Service: Cardiovascular;  Laterality: N/A;  . PERIPHERAL VASCULAR CATHETERIZATION Right 04/19/2016   Procedure: Thrombectomy/thrombolysis right IJ DVT;  Surgeon: JAlgernon Huxley MD;  [facility];  Service: Cardiovascular;  Laterality: Right;  . PORT-A-CATH REMOVAL N/A 04/07/2016   Procedure: REMOVAL PORT-A-CATH;  Surgeon: VSerafina Mitchell MD;  Location: [facility];  Service: Vascular;  Laterality: N/A;  . THROAT SURGERY    . TUMOR REMOVAL Right    "years ago-right lower posterior side"    FAMILY HISTORY :   Family History  Problem Relation Age of Onset  . Cancer - Colon Father   . Cancer - Colon Sister     SOCIAL HISTORY:   Social History  Substance Use Topics  . Smoking status: Current Every Day Smoker    Packs/day: 1.00    Years: 62.00    Types: Cigarettes  .  Smokeless tobacco: Never Used  . Alcohol use 3.6 oz/week    6 Cans of beer per week     Comment: rarely    ALLERGIES:  is allergic to clarithromycin and penicillins.  MEDICATIONS:  Current Outpatient Prescriptions  Medication Sig Dispense Refill  . albuterol (PROAIR HFA) 108 (90 Base) MCG/ACT inhaler Inhale 2 puffs into the lungs every 6 (six) hours as needed for wheezing or shortness of breath.     Marland Kitchen albuterol (PROVENTIL) (2.5 MG/3ML) 0.083% nebulizer solution Inhale 3 mLs into the lungs every 6 (six) hours as needed for wheezing or shortness  of breath.     Marland Kitchen atorvastatin (LIPITOR) 40 MG tablet Take 1 tablet (40 mg total) by mouth daily at 6 PM. 30 tablet 1  . losartan (COZAAR) 25 MG tablet Take 1 tablet (25 mg total) by mouth daily. 30 tablet 1  . metFORMIN (GLUCOPHAGE) 1000 MG tablet Take 1,000 mg by mouth 2 (two) times daily with a meal.     . metoprolol succinate (TOPROL-XL) 50 MG 24 hr tablet Take 1 tablet (50 mg total) by mouth daily. Take with or immediately following a meal. 30 tablet 1  . Rivaroxaban 15 & 20 MG TBPK Take as directed on package: Start with one '15mg'$  tablet by mouth twice a day with food. On Day 22, switch to one '20mg'$  tablet once a day with food. 51 each 0  . umeclidinium-vilanterol (ANORO ELLIPTA) 62.5-25 MCG/INH AEPB Inhale 1 puff into the lungs daily.    Marland Kitchen ipratropium-albuterol (DUONEB) 0.5-2.5 (3) MG/3ML SOLN Take 3 mLs by nebulization every 6 (six) hours as needed (shortness of breath). (Patient not taking: Reported on 06/04/2016) 360 mL 0  . predniSONE (DELTASONE) 10 MG tablet Label  & dispense according to the schedule below. 5 Pills PO for 2 days then, 4 Pills PO for 2 days, 3 Pills PO for 2 days, 2 Pills PO for 2 days, 1 Pill PO for 2 days then STOP. 30 tablet 0   No current facility-administered medications for this visit.     PHYSICAL EXAMINATION: ECOG PERFORMANCE STATUS: 1 - Symptomatic but completely ambulatory  BP 116/72 (BP Location: Left Arm, Patient Position: Sitting)   Pulse 80   Temp 98.4 F (36.9 C) (Tympanic)   Resp 18   Wt 186 lb 8 oz (84.6 kg)   BMI 27.54 kg/m   Filed Weights   06/04/16 0918  Weight: 186 lb 8 oz (84.6 kg)    GENERAL: Well-nourished well-developed; Alert, no distress and comfortable.  He is alone. EYES: no pallor or icterus OROPHARYNX: no thrush or ulceration; poor dentition; Bil ear wax  NECK: vague swelling under the neck; tender; no skin changes.  LYMPH:  no palpable lymphadenopathy in the cervical, axillary or inguinal regions LUNGS: Decreased air entry  bilaterally.  No wheeze or crackles HEART/CVS: regular rate & rhythm and no murmurs; No lower extremity edema ABDOMEN:abdomen soft, non-tender and normal bowel sounds Musculoskeletal:no cyanosis of digits and no clubbing; NO tenderness noted in the left chest wall. No lumps or bumps noted. PSYCH: alert & oriented x 3 with fluent speech NEURO: no focal motor/sensory deficits SKIN:  no rashes or significant lesions  LABORATORY DATA:  I have reviewed the data as listed    Component Value Date/Time   NA 135 06/04/2016 0830   K 4.5 06/04/2016 0830   CL 98 (L) 06/04/2016 0830   CO2 28 06/04/2016 0830   GLUCOSE 151 (H) 06/04/2016 0830   BUN 14  06/04/2016 0830   CREATININE 0.83 06/04/2016 0830   CREATININE 0.99 02/26/2014 1033   CALCIUM 8.9 06/04/2016 0830   CALCIUM 8.2 (L) 02/26/2014 1033   PROT 7.0 06/04/2016 0830   PROT 7.3 02/26/2014 1033   ALBUMIN 4.0 06/04/2016 0830   ALBUMIN 3.4 02/26/2014 1033   AST 18 06/04/2016 0830   AST 17 02/26/2014 1033   ALT 21 06/04/2016 0830   ALT 25 02/26/2014 1033   ALKPHOS 50 06/04/2016 0830   ALKPHOS 54 02/26/2014 1033   BILITOT 0.5 06/04/2016 0830   BILITOT 0.4 02/26/2014 1033   GFRNONAA >60 06/04/2016 0830   GFRNONAA >60 02/26/2014 1033   GFRAA >60 06/04/2016 0830   GFRAA >60 02/26/2014 1033    No results found for: SPEP, UPEP  Lab Results  Component Value Date   WBC 8.6 06/04/2016   NEUTROABS 6.7 (H) 06/04/2016   HGB 12.2 (L) 06/04/2016   HCT 35.5 (L) 06/04/2016   MCV 86.5 06/04/2016   PLT 172 06/04/2016      Chemistry      Component Value Date/Time   NA 135 06/04/2016 0830   K 4.5 06/04/2016 0830   CL 98 (L) 06/04/2016 0830   CO2 28 06/04/2016 0830   BUN 14 06/04/2016 0830   CREATININE 0.83 06/04/2016 0830   CREATININE 0.99 02/26/2014 1033      Component Value Date/Time   CALCIUM 8.9 06/04/2016 0830   CALCIUM 8.2 (L) 02/26/2014 1033   ALKPHOS 50 06/04/2016 0830   ALKPHOS 54 02/26/2014 1033   AST 18 06/04/2016 0830    AST 17 02/26/2014 1033   ALT 21 06/04/2016 0830   ALT 25 02/26/2014 1033   BILITOT 0.5 06/04/2016 0830   BILITOT 0.4 02/26/2014 1033      ASSESSMENT & PLAN:   Primary malignant neoplasm of left upper lobe of lung ([place]) # Small cell lung cancer- Extensive stage small cell lung cancer- s/p # 3 cycle. Most recent CT scan in the hospital did not show any worsening left upper lung disease; interstitial thickening question lymphangitic spread. Clinically this is less likely as patient since patient has improved.   # proceed with carbo-etop # 4 days. Labs are adequate. Discussed re: 6 cycles- pt declines.   #  Improved/ Neck pain- improved;  S/p thrombolysis; on xarelto; patient will likely need lifelong anti-coagulation.  # labs in 4 weeks/ will order CT scan fr mid- October 2017.      Cammie Sickle, MD 06/05/2016 7:40 AM

## 2016-06-04 NOTE — Progress Notes (Signed)
Uses Oxygen 2L at home as needed and at bedtime.  He did not bring his oxygen tank today stated he didn't feel short of breath this morning.

## 2016-06-05 ENCOUNTER — Inpatient Hospital Stay: Payer: Medicare Other

## 2016-06-05 VITALS — BP 108/64 | HR 63 | Temp 97.0°F | Resp 18

## 2016-06-05 DIAGNOSIS — C3492 Malignant neoplasm of unspecified part of left bronchus or lung: Secondary | ICD-10-CM

## 2016-06-05 DIAGNOSIS — Z5111 Encounter for antineoplastic chemotherapy: Secondary | ICD-10-CM | POA: Diagnosis not present

## 2016-06-05 DIAGNOSIS — C3412 Malignant neoplasm of upper lobe, left bronchus or lung: Secondary | ICD-10-CM

## 2016-06-05 MED ORDER — SODIUM CHLORIDE 0.9 % IV SOLN
Freq: Once | INTRAVENOUS | Status: AC
  Administered 2016-06-05: 14:00:00 via INTRAVENOUS
  Filled 2016-06-05: qty 1000

## 2016-06-05 MED ORDER — SODIUM CHLORIDE 0.9 % IV SOLN
100.0000 mg/m2 | Freq: Once | INTRAVENOUS | Status: AC
  Administered 2016-06-05: 210 mg via INTRAVENOUS
  Filled 2016-06-05: qty 10.5

## 2016-06-05 MED ORDER — DEXAMETHASONE SODIUM PHOSPHATE 100 MG/10ML IJ SOLN
10.0000 mg | Freq: Once | INTRAMUSCULAR | Status: AC
  Administered 2016-06-05: 10 mg via INTRAVENOUS
  Filled 2016-06-05: qty 1

## 2016-06-06 ENCOUNTER — Inpatient Hospital Stay: Payer: Medicare Other

## 2016-06-15 ENCOUNTER — Other Ambulatory Visit: Payer: Self-pay | Admitting: *Deleted

## 2016-06-15 ENCOUNTER — Telehealth: Payer: Self-pay | Admitting: *Deleted

## 2016-06-15 MED ORDER — RIVAROXABAN 20 MG PO TABS: 20.0000 mg | ORAL_TABLET | Freq: Every day | ORAL | 0 refills | Status: DC

## 2016-06-15 NOTE — Telephone Encounter (Signed)
Advised wife samples are ready for pick up

## 2016-06-15 NOTE — Telephone Encounter (Signed)
Called to state patient out of med and cannot afford copay, I advised her that we are gin=ving him samples while trying to get some assistance for him and asked if there is anything they can do to assist with this. She stated she would look into this. She also stated the Allyson Sabal is his normal case worker and her contact number is [phone] ext [phone]

## 2016-07-02 ENCOUNTER — Inpatient Hospital Stay: Payer: Medicare Other | Attending: Internal Medicine | Admitting: Internal Medicine

## 2016-07-02 ENCOUNTER — Encounter: Payer: Self-pay | Admitting: Internal Medicine

## 2016-07-02 ENCOUNTER — Inpatient Hospital Stay: Payer: Medicare Other

## 2016-07-02 VITALS — BP 139/70 | HR 82 | Temp 96.8°F | Resp 20 | Ht 69.0 in | Wt 193.2 lb

## 2016-07-02 DIAGNOSIS — Z9221 Personal history of antineoplastic chemotherapy: Secondary | ICD-10-CM | POA: Diagnosis not present

## 2016-07-02 DIAGNOSIS — R0602 Shortness of breath: Secondary | ICD-10-CM

## 2016-07-02 DIAGNOSIS — Z7901 Long term (current) use of anticoagulants: Secondary | ICD-10-CM | POA: Diagnosis not present

## 2016-07-02 DIAGNOSIS — Z7984 Long term (current) use of oral hypoglycemic drugs: Secondary | ICD-10-CM

## 2016-07-02 DIAGNOSIS — C3492 Malignant neoplasm of unspecified part of left bronchus or lung: Secondary | ICD-10-CM

## 2016-07-02 DIAGNOSIS — C9210 Chronic myeloid leukemia, BCR/ABL-positive, not having achieved remission: Secondary | ICD-10-CM | POA: Insufficient documentation

## 2016-07-02 DIAGNOSIS — J441 Chronic obstructive pulmonary disease with (acute) exacerbation: Secondary | ICD-10-CM

## 2016-07-02 DIAGNOSIS — I10 Essential (primary) hypertension: Secondary | ICD-10-CM | POA: Diagnosis not present

## 2016-07-02 DIAGNOSIS — C3412 Malignant neoplasm of upper lobe, left bronchus or lung: Secondary | ICD-10-CM

## 2016-07-02 DIAGNOSIS — E119 Type 2 diabetes mellitus without complications: Secondary | ICD-10-CM | POA: Insufficient documentation

## 2016-07-02 DIAGNOSIS — I252 Old myocardial infarction: Secondary | ICD-10-CM | POA: Diagnosis not present

## 2016-07-02 DIAGNOSIS — Z79899 Other long term (current) drug therapy: Secondary | ICD-10-CM | POA: Insufficient documentation

## 2016-07-02 DIAGNOSIS — Z923 Personal history of irradiation: Secondary | ICD-10-CM | POA: Insufficient documentation

## 2016-07-02 DIAGNOSIS — Z86718 Personal history of other venous thrombosis and embolism: Secondary | ICD-10-CM | POA: Diagnosis not present

## 2016-07-02 DIAGNOSIS — J449 Chronic obstructive pulmonary disease, unspecified: Secondary | ICD-10-CM

## 2016-07-02 DIAGNOSIS — R0600 Dyspnea, unspecified: Secondary | ICD-10-CM

## 2016-07-02 DIAGNOSIS — R599 Enlarged lymph nodes, unspecified: Secondary | ICD-10-CM | POA: Insufficient documentation

## 2016-07-02 DIAGNOSIS — Z800 Family history of malignant neoplasm of digestive organs: Secondary | ICD-10-CM | POA: Diagnosis not present

## 2016-07-02 DIAGNOSIS — F17210 Nicotine dependence, cigarettes, uncomplicated: Secondary | ICD-10-CM | POA: Diagnosis not present

## 2016-07-02 DIAGNOSIS — R21 Rash and other nonspecific skin eruption: Secondary | ICD-10-CM | POA: Diagnosis not present

## 2016-07-02 LAB — CBC WITH DIFFERENTIAL/PLATELET
Basophils Absolute: 0.1 10*3/uL (ref 0–0.1)
Basophils Relative: 1 %
Eosinophils Absolute: 0.1 10*3/uL (ref 0–0.7)
Eosinophils Relative: 1 %
HEMATOCRIT: 34.2 % — AB (ref 40.0–52.0)
HEMOGLOBIN: 11.8 g/dL — AB (ref 13.0–18.0)
LYMPHS ABS: 1.5 10*3/uL (ref 1.0–3.6)
LYMPHS PCT: 17 %
MCH: 30.4 pg (ref 26.0–34.0)
MCHC: 34.5 g/dL (ref 32.0–36.0)
MCV: 88.1 fL (ref 80.0–100.0)
Monocytes Absolute: 1.2 10*3/uL — ABNORMAL HIGH (ref 0.2–1.0)
Monocytes Relative: 14 %
NEUTROS PCT: 67 %
Neutro Abs: 6 10*3/uL (ref 1.4–6.5)
Platelets: 214 10*3/uL (ref 150–440)
RBC: 3.88 MIL/uL — AB (ref 4.40–5.90)
RDW: 17.5 % — ABNORMAL HIGH (ref 11.5–14.5)
WBC: 8.8 10*3/uL (ref 3.8–10.6)

## 2016-07-02 LAB — COMPREHENSIVE METABOLIC PANEL
ALT: 13 U/L — ABNORMAL LOW (ref 17–63)
AST: 17 U/L (ref 15–41)
Albumin: 4.1 g/dL (ref 3.5–5.0)
Alkaline Phosphatase: 45 U/L (ref 38–126)
Anion gap: 6 (ref 5–15)
BUN: 12 mg/dL (ref 6–20)
CHLORIDE: 99 mmol/L — AB (ref 101–111)
CO2: 29 mmol/L (ref 22–32)
Calcium: 9.2 mg/dL (ref 8.9–10.3)
Creatinine, Ser: 0.77 mg/dL (ref 0.61–1.24)
Glucose, Bld: 146 mg/dL — ABNORMAL HIGH (ref 65–99)
POTASSIUM: 4.5 mmol/L (ref 3.5–5.1)
Sodium: 134 mmol/L — ABNORMAL LOW (ref 135–145)
Total Bilirubin: 0.8 mg/dL (ref 0.3–1.2)
Total Protein: 7.7 g/dL (ref 6.5–8.1)

## 2016-07-02 MED ORDER — ALBUTEROL SULFATE HFA 108 (90 BASE) MCG/ACT IN AERS: 2.0000 | INHALATION_SPRAY | Freq: Four times a day (QID) | RESPIRATORY_TRACT | 6 refills | Status: AC | PRN

## 2016-07-02 NOTE — Progress Notes (Signed)
Pt recent;y had biopsy top left upper skin arm and chest.  Reports about a week ago.  Per pt MD's thinks it related chemo.Marland KitchenMarland Kitchen

## 2016-07-02 NOTE — Progress Notes (Signed)
[place] OFFICE PROGRESS NOTE  Patient Care Team: Madelyn Brunner, MD as PCP - General (Internal Medicine) Cammie Sickle, MD as Consulting Physician (Oncology) Leona Singleton, RN as Oncology Nurse Navigator (Oncology)   SUMMARY OF ONCOLOGIC HISTORY:  Oncology History   # April-MAY 2017 SMALL CELL LUNG CANCER EXTENSIVE STAGE  LUL #1 4.1x 3.5cm;#2- 3.3x3.0 #3 Left pleural based nodule 32m [new]; Left apical radiation changes. 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  In the interim patient was seen in the dermatology office for skin rash status post biopsy; and also status post prednisone/rash seems to be improving  His shortness of breath is chronic. Chronic cough. No hemoptysis. Patient states his headaches are improved. Denies any neck pain; denies any significant headaches. He continues to be on Xarelto.  No fevers or chills. No hoarseness of voice.   REVIEW OF SYSTEMS:  A complete 10 point review of system is done which is negative except mentioned above/history of present illness.   PAST MEDICAL HISTORY :  Past Medical History:  Diagnosis Date  .  Asthma   . CML (chronic myelocytic leukemia) ([facility]   . COPD (chronic obstructive pulmonary disease) (HBellefont[facility]   . Diabetes mellitus type II, controlled (H[place]   . Diabetes mellitus without complication (H[place]   . Hypertension   . 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 Lung cancer, upper lobe (H[place] 2009  . Myocardial infarct (HT[place]   . Small cell lung cancer (H[place]     PAST SURGICAL HISTORY :   Past Surgical History:  Procedure Laterality Date  . PERIPHERAL VASCULAR CATHETERIZATION N/A 02/21/2016   Procedure: PGlori LuisCath Insertion;  Surgeon: JAlgernon Huxley MD;  [facility];  Service: Cardiovascular;  Laterality: N/A;  . PERIPHERAL VASCULAR CATHETERIZATION Right 04/19/2016   Procedure: Thrombectomy/thrombolysis right IJ DVT;  Surgeon: JAlgernon Huxley MD;  [facility];  Service: Cardiovascular;  Laterality: Right;  . PORT-A-CATH REMOVAL N/A 04/07/2016   Procedure: REMOVAL PORT-A-CATH;  Surgeon: VSerafina Mitchell MD;  Location: [facility];  Service: Vascular;  Laterality: N/A;  . THROAT SURGERY    . TUMOR REMOVAL Right    "years ago-right lower posterior side"    FAMILY HISTORY :   Family History  Problem Relation Age of Onset  . Cancer - Colon Father   . Cancer - Colon Sister     SOCIAL HISTORY:   Social History  Substance Use Topics  . Smoking status: Current Every Day Smoker    Packs/day: 1.00    Years: 62.00    Types:  Cigarettes  . Smokeless tobacco: Never Used  . Alcohol use 3.6 oz/week    6 Cans of beer per week     Comment: rarely    ALLERGIES:  is allergic to clarithromycin and penicillins.  MEDICATIONS:  Current Outpatient Prescriptions  Medication Sig Dispense Refill  . albuterol (PROAIR HFA) 108 (90 Base) MCG/ACT inhaler Inhale 2 puffs into the lungs every 6 (six) hours as needed for wheezing or shortness of breath. 1 Inhaler 6  . albuterol (PROVENTIL) (2.5 MG/3ML) 0.083% nebulizer solution Inhale 3 mLs into the lungs every 6 (six) hours  as needed for wheezing or shortness of breath.     Marland Kitchen atorvastatin (LIPITOR) 40 MG tablet Take 1 tablet (40 mg total) by mouth daily at 6 PM. 30 tablet 1  . ipratropium-albuterol (DUONEB) 0.5-2.5 (3) MG/3ML SOLN Take 3 mLs by nebulization every 6 (six) hours as needed (shortness of breath). 360 mL 0  . metFORMIN (GLUCOPHAGE) 1000 MG tablet Take 1,000 mg by mouth 2 (two) times daily with a meal.     . rivaroxaban (XARELTO) 20 MG TABS tablet Take 1 tablet (20 mg total) by mouth daily with supper. 28 tablet 0  . umeclidinium-vilanterol (ANORO ELLIPTA) 62.5-25 MCG/INH AEPB Inhale 1 puff into the lungs daily.    . predniSONE (DELTASONE) 10 MG tablet Label  & dispense according to the schedule below. 5 Pills PO for 2 days then, 4 Pills PO for 2 days, 3 Pills PO for 2 days, 2 Pills PO for 2 days, 1 Pill PO for 2 days then STOP. (Patient not taking: Reported on 07/02/2016) 30 tablet 0   No current facility-administered medications for this visit.     PHYSICAL EXAMINATION: ECOG PERFORMANCE STATUS: 1 - Symptomatic but completely ambulatory  BP 139/70 (BP Location: Right Arm, Patient Position: Sitting)   Pulse 82   Temp (!) 96.8 F (36 C) (Tympanic)   Resp 20   Ht '5\' 9"'$  (1.753 m)   Wt 193 lb 3.2 oz (87.6 kg)   SpO2 94%   BMI 28.53 kg/m   Filed Weights   07/02/16 0946  Weight: 193 lb 3.2 oz (87.6 kg)    GENERAL: Well-nourished well-developed; Alert, no distress and comfortable.  He is alone. EYES: no pallor or icterus OROPHARYNX: no thrush or ulceration; poor dentition; Bil ear wax  NECK: vague swelling under the neck; tender; no skin changes.  LYMPH:  no palpable lymphadenopathy in the cervical, axillary or inguinal regions LUNGS: Decreased air entry bilaterally.  No wheeze or crackles HEART/CVS: regular rate & rhythm and no murmurs; No lower extremity edema ABDOMEN:abdomen soft, non-tender and normal bowel sounds Musculoskeletal:no cyanosis of digits and no clubbing; NO tenderness noted in  the left chest wall. No lumps or bumps noted. PSYCH: alert & oriented x 3 with fluent speech NEURO: no focal motor/sensory deficits SKIN:  Macular rash noted on the torso anteriorly and posteriorly. This seems to be healing.  LABORATORY DATA:  I have reviewed the data as listed    Component Value Date/Time   NA 134 (L) 07/02/2016 0920   K 4.5 07/02/2016 0920   CL 99 (L) 07/02/2016 0920   CO2 29 07/02/2016 0920   GLUCOSE 146 (H) 07/02/2016 0920   BUN 12 07/02/2016 0920   CREATININE 0.77 07/02/2016 0920   CREATININE 0.99 02/26/2014 1033   CALCIUM 9.2 07/02/2016 0920   CALCIUM 8.2 (L) 02/26/2014 1033   PROT 7.7 07/02/2016 0920   PROT 7.3 02/26/2014 1033   ALBUMIN  4.1 07/02/2016 0920   ALBUMIN 3.4 02/26/2014 1033   AST 17 07/02/2016 0920   AST 17 02/26/2014 1033   ALT 13 (L) 07/02/2016 0920   ALT 25 02/26/2014 1033   ALKPHOS 45 07/02/2016 0920   ALKPHOS 54 02/26/2014 1033   BILITOT 0.8 07/02/2016 0920   BILITOT 0.4 02/26/2014 1033   GFRNONAA >60 07/02/2016 0920   GFRNONAA >60 02/26/2014 1033   GFRAA >60 07/02/2016 0920   GFRAA >60 02/26/2014 1033    No results found for: SPEP, UPEP  Lab Results  Component Value Date   WBC 8.8 07/02/2016   NEUTROABS 6.0 07/02/2016   HGB 11.8 (L) 07/02/2016   HCT 34.2 (L) 07/02/2016   MCV 88.1 07/02/2016   PLT 214 07/02/2016      Chemistry      Component Value Date/Time   NA 134 (L) 07/02/2016 0920   K 4.5 07/02/2016 0920   CL 99 (L) 07/02/2016 0920   CO2 29 07/02/2016 0920   BUN 12 07/02/2016 0920   CREATININE 0.77 07/02/2016 0920   CREATININE 0.99 02/26/2014 1033      Component Value Date/Time   CALCIUM 9.2 07/02/2016 0920   CALCIUM 8.2 (L) 02/26/2014 1033   ALKPHOS 45 07/02/2016 0920   ALKPHOS 54 02/26/2014 1033   AST 17 07/02/2016 0920   AST 17 02/26/2014 1033   ALT 13 (L) 07/02/2016 0920   ALT 25 02/26/2014 1033   BILITOT 0.8 07/02/2016 0920   BILITOT 0.4 02/26/2014 1033      ASSESSMENT & PLAN:   Primary  malignant neoplasm of left upper lobe of lung ([place]) # Small cell lung cancer- Extensive stage small cell lung cancer- s/p # 4 cycle. Declined 6 cycles.   # clinically no evidence of progression at this time.  Labs are adequate. Repeat CT in 4 weeks.   #  Improved/ Neck pain- improved;  S/p thrombolysis; on xarelto; patient will likely need lifelong anti-coagulation.  # skin rash- s/p Bx- ? Allergic s/p prednsione. dermatology [facility].   # labs in 5 weeks/ labs/ CT scan few days prior/ MD.      Cammie Sickle, MD 07/03/2016 8:36 AM

## 2016-07-02 NOTE — Assessment & Plan Note (Addendum)
#   Small cell lung cancer- Extensive stage small cell lung cancer- s/p # 4 cycle. Declined 6 cycles.   # clinically no evidence of progression at this time.  Labs are adequate. Repeat CT in 4 weeks.   #  Improved/ Neck pain- improved;  S/p thrombolysis; on xarelto; patient will likely need lifelong anti-coagulation.  # skin rash- s/p Bx- ? Allergic s/p prednsione. dermatology [facility].   # labs in 5 weeks/ labs/ CT scan few days prior/ MD.

## 2016-07-20 ENCOUNTER — Telehealth: Payer: Self-pay | Admitting: *Deleted

## 2016-07-20 NOTE — Telephone Encounter (Signed)
Patient contacted cancer center - needs copay assistance ongoing financial assistance.  Patient was instructed to pick up a copay assistance card. I left this card with Wynell Balloon at the front desk cancer center receptionist for pt to pick up. I explained to patient that he must follow the directions to activate the card. This will provide patient once eligible for 3 months.

## 2016-07-23 ENCOUNTER — Telehealth: Payer: Self-pay | Admitting: *Deleted

## 2016-07-23 NOTE — Telephone Encounter (Signed)
No one will take co pay card because he has MCR. He has called [employer] for an application, but it will take 7 days to get it. I went to our pharmacy and got him a 30 day supply of med and he will pick it up tomorrow.

## 2016-07-30 ENCOUNTER — Ambulatory Visit
Admission: RE | Admit: 2016-07-30 | Discharge: 2016-07-30 | Disposition: A | Discharge status: Home / Self Care | Payer: Medicare Other | Source: Ambulatory Visit | Attending: Internal Medicine | Admitting: Internal Medicine

## 2016-07-30 DIAGNOSIS — C3412 Malignant neoplasm of upper lobe, left bronchus or lung: Secondary | ICD-10-CM | POA: Diagnosis not present

## 2016-07-30 DIAGNOSIS — I700 Atherosclerosis of aorta: Secondary | ICD-10-CM | POA: Diagnosis not present

## 2016-07-30 DIAGNOSIS — I2510 Atherosclerotic heart disease of native coronary artery without angina pectoris: Secondary | ICD-10-CM | POA: Diagnosis not present

## 2016-07-30 MED ORDER — IOPAMIDOL (ISOVUE-300) INJECTION 61%
75.0000 mL | Freq: Once | INTRAVENOUS | Status: AC | PRN
  Administered 2016-07-30: 75 mL via INTRAVENOUS

## 2016-08-06 ENCOUNTER — Encounter: Payer: Self-pay | Admitting: Internal Medicine

## 2016-08-06 ENCOUNTER — Inpatient Hospital Stay: Payer: Medicare Other | Attending: Internal Medicine | Admitting: Internal Medicine

## 2016-08-06 ENCOUNTER — Inpatient Hospital Stay: Payer: Medicare Other

## 2016-08-06 VITALS — BP 142/71 | HR 84 | Temp 96.7°F | Ht 69.0 in | Wt 192.8 lb

## 2016-08-06 DIAGNOSIS — J45909 Unspecified asthma, uncomplicated: Secondary | ICD-10-CM

## 2016-08-06 DIAGNOSIS — E119 Type 2 diabetes mellitus without complications: Secondary | ICD-10-CM | POA: Diagnosis not present

## 2016-08-06 DIAGNOSIS — F17210 Nicotine dependence, cigarettes, uncomplicated: Secondary | ICD-10-CM

## 2016-08-06 DIAGNOSIS — Z79899 Other long term (current) drug therapy: Secondary | ICD-10-CM

## 2016-08-06 DIAGNOSIS — Z9221 Personal history of antineoplastic chemotherapy: Secondary | ICD-10-CM

## 2016-08-06 DIAGNOSIS — R21 Rash and other nonspecific skin eruption: Secondary | ICD-10-CM | POA: Diagnosis not present

## 2016-08-06 DIAGNOSIS — Z7984 Long term (current) use of oral hypoglycemic drugs: Secondary | ICD-10-CM | POA: Diagnosis not present

## 2016-08-06 DIAGNOSIS — I252 Old myocardial infarction: Secondary | ICD-10-CM

## 2016-08-06 DIAGNOSIS — R0602 Shortness of breath: Secondary | ICD-10-CM

## 2016-08-06 DIAGNOSIS — Z86718 Personal history of other venous thrombosis and embolism: Secondary | ICD-10-CM

## 2016-08-06 DIAGNOSIS — C3412 Malignant neoplasm of upper lobe, left bronchus or lung: Secondary | ICD-10-CM | POA: Diagnosis present

## 2016-08-06 DIAGNOSIS — C778 Secondary and unspecified malignant neoplasm of lymph nodes of multiple regions: Secondary | ICD-10-CM

## 2016-08-06 DIAGNOSIS — Z7901 Long term (current) use of anticoagulants: Secondary | ICD-10-CM

## 2016-08-06 DIAGNOSIS — Z856 Personal history of leukemia: Secondary | ICD-10-CM

## 2016-08-06 DIAGNOSIS — R05 Cough: Secondary | ICD-10-CM

## 2016-08-06 DIAGNOSIS — I10 Essential (primary) hypertension: Secondary | ICD-10-CM

## 2016-08-06 DIAGNOSIS — Z923 Personal history of irradiation: Secondary | ICD-10-CM | POA: Diagnosis not present

## 2016-08-06 DIAGNOSIS — Z800 Family history of malignant neoplasm of digestive organs: Secondary | ICD-10-CM | POA: Diagnosis not present

## 2016-08-06 LAB — COMPREHENSIVE METABOLIC PANEL
ALT: 12 U/L — AB (ref 17–63)
AST: 18 U/L (ref 15–41)
Albumin: 3.9 g/dL (ref 3.5–5.0)
Alkaline Phosphatase: 50 U/L (ref 38–126)
Anion gap: 11 (ref 5–15)
BUN: 14 mg/dL (ref 6–20)
CHLORIDE: 98 mmol/L — AB (ref 101–111)
CO2: 26 mmol/L (ref 22–32)
CREATININE: 0.88 mg/dL (ref 0.61–1.24)
Calcium: 8.8 mg/dL — ABNORMAL LOW (ref 8.9–10.3)
GFR calc non Af Amer: >60 mL/min (ref >60)
Glucose, Bld: 191 mg/dL — ABNORMAL HIGH (ref 65–99)
POTASSIUM: 4.2 mmol/L (ref 3.5–5.1)
SODIUM: 135 mmol/L (ref 135–145)
Total Bilirubin: 0.5 mg/dL (ref 0.3–1.2)
Total Protein: 7.5 g/dL (ref 6.5–8.1)

## 2016-08-06 LAB — CBC WITH DIFFERENTIAL/PLATELET
Basophils Absolute: 0.1 10*3/uL (ref 0–0.1)
Basophils Relative: 1 %
EOS ABS: 0.2 10*3/uL (ref 0–0.7)
Eosinophils Relative: 4 %
HCT: 37.6 % — ABNORMAL LOW (ref 40.0–52.0)
HEMOGLOBIN: 12.7 g/dL — AB (ref 13.0–18.0)
LYMPHS ABS: 1.2 10*3/uL (ref 1.0–3.6)
LYMPHS PCT: 23 %
MCH: 29.1 pg (ref 26.0–34.0)
MCHC: 33.8 g/dL (ref 32.0–36.0)
MCV: 86 fL (ref 80.0–100.0)
MONOS PCT: 8 %
Monocytes Absolute: 0.4 10*3/uL (ref 0.2–1.0)
NEUTROS PCT: 64 %
Neutro Abs: 3.3 10*3/uL (ref 1.4–6.5)
Platelets: 202 10*3/uL (ref 150–440)
RBC: 4.37 MIL/uL — ABNORMAL LOW (ref 4.40–5.90)
RDW: 15.6 % — ABNORMAL HIGH (ref 11.5–14.5)
WBC: 5.2 10*3/uL (ref 3.8–10.6)

## 2016-08-06 NOTE — Progress Notes (Signed)
Patient here for follow up. States " no changes since last appointment".

## 2016-08-06 NOTE — Assessment & Plan Note (Addendum)
#   Small cell lung cancer- Extensive stage small cell lung cancer- s/p # 4 cycle. Declined 6 cycles. CT Oct 23rd- progression in mediastinum/ right hilum. Discussed the use of chemotherapy; patient continues to be reluctant at this time for chemotherapy. I think it's reasonable for palliative RT to the areas of recurrence; discussed with Dr.Crystal.   #   Improved/ Neck pain- improved;  S/p thrombolysis; on xarelto; patient will likely need lifelong anti-coagulation.  # skin rash- s/p Bx- ? Allergic s/p Derm eva; try hydrocortisone/ benadryl for itch.   # PET ASAP; follow up Dr.Crystal few days later; follow up with me in 3 weeks/ no labs.

## 2016-08-06 NOTE — Progress Notes (Signed)
[facility] OFFICE PROGRESS NOTE  Patient Care Team: Madelyn Brunner, MD as PCP - General (Internal Medicine) Cammie Sickle, MD as Consulting Physician (Oncology) Leona Singleton, RN as Oncology Nurse Navigator (Oncology)   SUMMARY OF ONCOLOGIC HISTORY:  Oncology History   # April-MAY 2017 SMALL CELL LUNG CANCER EXTENSIVE STAGE  LUL #1 4.1x 3.5cm;#2- 3.3x3.0  #3 Left pleural based nodule 50m [new]; Left apical radiation changes. 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] s/p Chemo-RT [decined Surgery]     Small cell lung cancer ([facility]   02/06/2016 Initial Diagnosis    Small cell lung cancer ([place]       Primary malignant neoplasm of left upper lobe of lung ([facility]   04/18/2016 Initial Diagnosis    Primary malignant neoplasm of left upper lobe of lung ([facility]        INTERVAL HISTORY:  75year old male patient with extensive stage small cell lung cancer currently on first line therapy with carboplatin etoposide chemotherapy- status post cycle #4 Approximately 2 months ago is here to review the results of his restaging CAT scan  Patient noted to have mild rash on his left arm. He has been applying topical steroids; itchy. No rash anywhere else.   He has chronic shortness of breath especially the mornings. He has been using his nebulizer.  Chronic cough. No hemoptysis. Patient states his headaches are improved. Denies any neck pain; denies any significant headaches. He continues to be on Xarelto.  No fevers or chills. No hoarseness of voice.   REVIEW OF SYSTEMS:  A complete 10 point review of system is  done which is negative except mentioned above/history of present illness.   PAST MEDICAL HISTORY :  Past Medical History:  Diagnosis Date  . Asthma   . CML (chronic myelocytic leukemia) ([facility]   . COPD (chronic obstructive pulmonary disease) ([facility]   . Diabetes mellitus type II, controlled ([facility] [place]   . Diabetes mellitus without complication ([facility]   . Hypertension   . 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 Lung cancer, upper lobe ([facility] 2009  . Myocardial infarct   . Small cell lung cancer ([facility]     PAST SURGICAL HISTORY :   Past Surgical History:  Procedure Laterality Date  . PERIPHERAL VASCULAR CATHETERIZATION N/A 02/21/2016   Procedure: PGlori LuisCath Insertion;  Surgeon: JAlgernon Huxley MD;  [facility];  Service: Cardiovascular;  Laterality: N/A;  . PERIPHERAL VASCULAR CATHETERIZATION Right 04/19/2016   Procedure: Thrombectomy/thrombolysis right IJ DVT;  Surgeon: JAlgernon Huxley MD;  [facility];  Service: Cardiovascular;  Laterality: Right;  . PORT-A-CATH REMOVAL N/A 04/07/2016   Procedure: REMOVAL PORT-A-CATH;  Surgeon: VSerafina Mitchell MD;  Location: [facility] ORS;  Service: Vascular;  Laterality: N/A;  . THROAT SURGERY    . TUMOR REMOVAL Right    "years ago-right lower posterior side"    FAMILY HISTORY :   Family History  Problem Relation Age of Onset  . Cancer - Colon Father   . Cancer - Colon Sister     SOCIAL HISTORY:  Social History  Substance Use Topics  . Smoking status: Current Every Day Smoker    Packs/day: 1.00    Years: 62.00    Types: Cigarettes  . Smokeless tobacco: Never Used  . Alcohol use 3.6 oz/week    6 Cans of beer per week     Comment: rarely    ALLERGIES:  is allergic to clarithromycin and penicillins.  MEDICATIONS:  Current Outpatient Prescriptions  Medication Sig Dispense Refill  . albuterol (PROAIR HFA) 108 (90 Base) MCG/ACT inhaler Inhale 2 puffs into the lungs every 6 (six) hours as needed for wheezing or  shortness of breath. 1 Inhaler 6  . albuterol (PROVENTIL) (2.5 MG/3ML) 0.083% nebulizer solution Inhale 3 mLs into the lungs every 6 (six) hours as needed for wheezing or shortness of breath.     Marland Kitchen ipratropium-albuterol (DUONEB) 0.5-2.5 (3) MG/3ML SOLN Take 3 mLs by nebulization every 6 (six) hours as needed (shortness of breath). 360 mL 0  . metFORMIN (GLUCOPHAGE) 1000 MG tablet Take 1,000 mg by mouth 2 (two) times daily with a meal.     . rivaroxaban (XARELTO) 20 MG TABS tablet Take 1 tablet (20 mg total) by mouth daily with supper. 28 tablet 0  . atorvastatin (LIPITOR) 40 MG tablet Take 1 tablet (40 mg total) by mouth daily at 6 PM. (Patient not taking: Reported on 08/06/2016) 30 tablet 1  . predniSONE (DELTASONE) 10 MG tablet Label  & dispense according to the schedule below. 5 Pills PO for 2 days then, 4 Pills PO for 2 days, 3 Pills PO for 2 days, 2 Pills PO for 2 days, 1 Pill PO for 2 days then STOP. (Patient not taking: Reported on 08/06/2016) 30 tablet 0  . umeclidinium-vilanterol (ANORO ELLIPTA) 62.5-25 MCG/INH AEPB Inhale 1 puff into the lungs daily.     No current facility-administered medications for this visit.     PHYSICAL EXAMINATION: ECOG PERFORMANCE STATUS: 1 - Symptomatic but completely ambulatory  BP (!) 142/71 (BP Location: Left Arm, Patient Position: Sitting)   Pulse 84   Temp (!) 96.7 F (35.9 C) (Tympanic)   Ht '5\' 9"'$  (1.753 m)   Wt 192 lb 12.8 oz (87.5 kg)   BMI 28.47 kg/m   Filed Weights   08/06/16 1017  Weight: 192 lb 12.8 oz (87.5 kg)    GENERAL: Well-nourished well-developed; Alert, no distress and comfortable.  He is alone. EYES: no pallor or icterus OROPHARYNX: no thrush or ulceration; poor dentition; Bil ear wax  NECK: vague swelling under the neck; tender; no skin changes.  LYMPH:  no palpable lymphadenopathy in the cervical, axillary or inguinal regions LUNGS: Decreased air entry bilaterally.  No wheeze or crackles HEART/CVS: regular rate & rhythm  and no murmurs; No lower extremity edema ABDOMEN:abdomen soft, non-tender and normal bowel sounds Musculoskeletal:no cyanosis of digits and no clubbing; NO tenderness noted in the left chest wall. No lumps or bumps noted. PSYCH: alert & oriented x 3 with fluent speech NEURO: no focal motor/sensory deficits SKIN: Macular rash noted on the left arm.  LABORATORY DATA:  I have reviewed the data as listed    Component Value Date/Time   NA 135 08/06/2016 0945   K 4.2 08/06/2016 0945   CL 98 (L) 08/06/2016 0945   CO2 26 08/06/2016 0945   GLUCOSE 191 (H) 08/06/2016 0945   BUN 14 08/06/2016 0945   CREATININE 0.88 08/06/2016 0945   CREATININE 0.99 02/26/2014 1033   CALCIUM 8.8 (L) 08/06/2016 0945   CALCIUM  8.2 (L) 02/26/2014 1033   PROT 7.5 08/06/2016 0945   PROT 7.3 02/26/2014 1033   ALBUMIN 3.9 08/06/2016 0945   ALBUMIN 3.4 02/26/2014 1033   AST 18 08/06/2016 0945   AST 17 02/26/2014 1033   ALT 12 (L) 08/06/2016 0945   ALT 25 02/26/2014 1033   ALKPHOS 50 08/06/2016 0945   ALKPHOS 54 02/26/2014 1033   BILITOT 0.5 08/06/2016 0945   BILITOT 0.4 02/26/2014 1033   GFRNONAA >60 08/06/2016 0945   GFRNONAA >60 02/26/2014 1033   GFRAA >60 08/06/2016 0945   GFRAA >60 02/26/2014 1033    No results found for: SPEP, UPEP  Lab Results  Component Value Date   WBC 5.2 08/06/2016   NEUTROABS 3.3 08/06/2016   HGB 12.7 (L) 08/06/2016   HCT 37.6 (L) 08/06/2016   MCV 86.0 08/06/2016   PLT 202 08/06/2016      Chemistry      Component Value Date/Time   NA 135 08/06/2016 0945   K 4.2 08/06/2016 0945   CL 98 (L) 08/06/2016 0945   CO2 26 08/06/2016 0945   BUN 14 08/06/2016 0945   CREATININE 0.88 08/06/2016 0945   CREATININE 0.99 02/26/2014 1033      Component Value Date/Time   CALCIUM 8.8 (L) 08/06/2016 0945   CALCIUM 8.2 (L) 02/26/2014 1033   ALKPHOS 50 08/06/2016 0945   ALKPHOS 54 02/26/2014 1033   AST 18 08/06/2016 0945   AST 17 02/26/2014 1033   ALT 12 (L) 08/06/2016 0945    ALT 25 02/26/2014 1033   BILITOT 0.5 08/06/2016 0945   BILITOT 0.4 02/26/2014 1033      ASSESSMENT & PLAN:   Primary malignant neoplasm of left upper lobe of lung ([place]) # Small cell lung cancer- Extensive stage small cell lung cancer- s/p # 4 cycle. Declined 6 cycles. CT Oct 23rd- progression in mediastinum/ right hilum. Discussed the use of chemotherapy; patient continues to be reluctant at this time for chemotherapy. I think it's reasonable for palliative RT to the areas of recurrence; discussed with Dr.Crystal.   #   Improved/ Neck pain- improved;  S/p thrombolysis; on xarelto; patient will likely need lifelong anti-coagulation.  # skin rash- s/p Bx- ? Allergic s/p Derm eva; try hydrocortisone/ benadryl for itch.   # PET ASAP; follow up Dr.Crystal few days later; follow up with me in 3 weeks/ no labs.      Cammie Sickle, MD 08/07/2016 8:16 AM

## 2016-08-09 ENCOUNTER — Ambulatory Visit: Payer: Medicare Other

## 2016-08-10 ENCOUNTER — Other Ambulatory Visit: Payer: Self-pay | Admitting: *Deleted

## 2016-08-10 MED ORDER — RIVAROXABAN 20 MG PO TABS: 20.0000 mg | ORAL_TABLET | Freq: Every day | ORAL | 11 refills | Status: DC

## 2016-08-13 ENCOUNTER — Ambulatory Visit: Payer: Medicare Other | Admitting: Radiation Oncology

## 2016-08-15 ENCOUNTER — Ambulatory Visit
Admission: RE | Admit: 2016-08-15 | Discharge: 2016-08-15 | Disposition: A | Discharge status: Home / Self Care | Payer: Medicare Other | Source: Ambulatory Visit | Attending: Radiation Oncology | Admitting: Radiation Oncology

## 2016-08-15 ENCOUNTER — Encounter: Payer: Self-pay | Admitting: Radiation Oncology

## 2016-08-15 VITALS — BP 115/76 | HR 82 | Temp 95.8°F | Resp 18 | Wt 191.6 lb

## 2016-08-15 DIAGNOSIS — C3402 Malignant neoplasm of left main bronchus: Secondary | ICD-10-CM | POA: Diagnosis not present

## 2016-08-15 DIAGNOSIS — R0602 Shortness of breath: Secondary | ICD-10-CM | POA: Insufficient documentation

## 2016-08-15 DIAGNOSIS — Z510 Encounter for antineoplastic radiation therapy: Secondary | ICD-10-CM | POA: Insufficient documentation

## 2016-08-15 DIAGNOSIS — F17210 Nicotine dependence, cigarettes, uncomplicated: Secondary | ICD-10-CM | POA: Diagnosis not present

## 2016-08-15 DIAGNOSIS — C3400 Malignant neoplasm of unspecified main bronchus: Secondary | ICD-10-CM

## 2016-08-15 DIAGNOSIS — C781 Secondary malignant neoplasm of mediastinum: Secondary | ICD-10-CM | POA: Diagnosis not present

## 2016-08-15 NOTE — Progress Notes (Signed)
Radiation Oncology Follow up Note  Name: Richard Jimenez   Date:   08/15/2016 MRN:  [id number] DOB: 08/18/1941    This 75 y.o. male presents to the clinic today for reevaluation of further palliative radiation therapy for extensive stage small cell lung cancer progressing in his chest.  REFERRING PROVIDER: Madelyn Brunner, MD  HPI: Patient is a 75 year old male. 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 He presented back in May 2017 with extensive stage small cell lung cancer received palliative radiation therapy to his left supraclavicular fossa and axilla for pain. 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 He has received 4 cycles of carboplatinum etoposide chemotherapy. He continues to have increasing shortness of breath and dyspnea on exertion. Recent CT scan shows progression of his right hilum and mediastinum. He is scheduled for PET CT scan next week. I discussed with medical oncology possibility for further palliative radiation therapy as patient has refused any further chemotherapy. He is seen today for opinion.  COMPLICATIONS OF TREATMENT: none  FOLLOW UP COMPLIANCE: keeps appointments   PHYSICAL EXAM:  BP 115/76   Pulse 82   Temp (!) 95.8 F (35.4 C)   Resp 18   Wt 191 lb 9.3 oz (86.9 kg)   BMI 28.29 kg/m  No cervical or supra clavicular adenopathy is detected. No adenopathy in the axilla is noted. Well-developed well-nourished patient in NAD. HEENT reveals PERLA, EOMI, discs not visualized.  Oral cavity is clear. No oral mucosal lesions are identified. Neck is clear without evidence of cervical or supraclavicular adenopathy. Lungs are clear to A&P. Cardiac examination is essentially unremarkable with regular rate and rhythm without murmur rub or thrill. Abdomen is benign with no  organomegaly or masses noted. Motor sensory and DTR levels are equal and symmetric in the upper and lower extremities. Cranial nerves II through XII are grossly intact. Proprioception is intact. No peripheral adenopathy or edema is identified. No motor or sensory levels are noted. Crude visual fields are within normal range.  RADIOLOGY RESULTS: CT scan is reviewed will review PET CT scan when available  PLAN: At this time like to review the PET CT scan when available. I believe I can give a short hypofractionated course of palliative radiation therapy to his right hilum and some progressive disease in his mediastinum. Would plan on 3000 cGy in 10 fractions. Would try to open avoid previously treated fields. Risks and benefits of treatment including possible exacerbation of cough possible dysphasia decreased blood counts skin reaction fatigue all were discussed in detail with the patient. I have personally ordered CT simulation. I will discuss with medical oncology results after PET/CT is available. I've tentatively set up a time for CT simulation after PET/CT scan is performed.  I would like to take this opportunity to thank you for allowing me to participate in the care of your patient.Armstead Peaks., MD

## 2016-08-22 ENCOUNTER — Encounter
Admission: RE | Admit: 2016-08-22 | Discharge: 2016-08-22 | Disposition: A | Discharge status: Home / Self Care | Payer: Medicare Other | Source: Ambulatory Visit | Attending: Internal Medicine | Admitting: Internal Medicine

## 2016-08-22 DIAGNOSIS — C3412 Malignant neoplasm of upper lobe, left bronchus or lung: Secondary | ICD-10-CM | POA: Insufficient documentation

## 2016-08-22 LAB — GLUCOSE, CAPILLARY: Glucose-Capillary: 129 mg/dL — ABNORMAL HIGH (ref 65–99)

## 2016-08-22 MED ORDER — FLUDEOXYGLUCOSE F - 18 (FDG) INJECTION
11.9600 | Freq: Once | INTRAVENOUS | Status: AC | PRN
  Administered 2016-08-22: 11.96 via INTRAVENOUS

## 2016-08-23 NOTE — Progress Notes (Signed)
FYI- Thx

## 2016-08-24 ENCOUNTER — Inpatient Hospital Stay: Payer: Medicare Other | Attending: Internal Medicine | Admitting: Internal Medicine

## 2016-08-24 VITALS — BP 125/76 | HR 81 | Temp 97.6°F | Resp 20 | Ht 69.0 in | Wt 189.0 lb

## 2016-08-24 DIAGNOSIS — J45909 Unspecified asthma, uncomplicated: Secondary | ICD-10-CM

## 2016-08-24 DIAGNOSIS — Z86718 Personal history of other venous thrombosis and embolism: Secondary | ICD-10-CM

## 2016-08-24 DIAGNOSIS — Z856 Personal history of leukemia: Secondary | ICD-10-CM

## 2016-08-24 DIAGNOSIS — Z923 Personal history of irradiation: Secondary | ICD-10-CM | POA: Diagnosis not present

## 2016-08-24 DIAGNOSIS — F17210 Nicotine dependence, cigarettes, uncomplicated: Secondary | ICD-10-CM

## 2016-08-24 DIAGNOSIS — Z800 Family history of malignant neoplasm of digestive organs: Secondary | ICD-10-CM

## 2016-08-24 DIAGNOSIS — Z7984 Long term (current) use of oral hypoglycemic drugs: Secondary | ICD-10-CM

## 2016-08-24 DIAGNOSIS — R0602 Shortness of breath: Secondary | ICD-10-CM

## 2016-08-24 DIAGNOSIS — Z9221 Personal history of antineoplastic chemotherapy: Secondary | ICD-10-CM | POA: Diagnosis not present

## 2016-08-24 DIAGNOSIS — Z79899 Other long term (current) drug therapy: Secondary | ICD-10-CM

## 2016-08-24 DIAGNOSIS — I252 Old myocardial infarction: Secondary | ICD-10-CM

## 2016-08-24 DIAGNOSIS — I82C19 Acute embolism and thrombosis of unspecified internal jugular vein: Secondary | ICD-10-CM

## 2016-08-24 DIAGNOSIS — R05 Cough: Secondary | ICD-10-CM | POA: Diagnosis not present

## 2016-08-24 DIAGNOSIS — J449 Chronic obstructive pulmonary disease, unspecified: Secondary | ICD-10-CM

## 2016-08-24 DIAGNOSIS — I10 Essential (primary) hypertension: Secondary | ICD-10-CM

## 2016-08-24 DIAGNOSIS — C3412 Malignant neoplasm of upper lobe, left bronchus or lung: Secondary | ICD-10-CM | POA: Diagnosis not present

## 2016-08-24 DIAGNOSIS — Z7901 Long term (current) use of anticoagulants: Secondary | ICD-10-CM | POA: Diagnosis not present

## 2016-08-24 DIAGNOSIS — E119 Type 2 diabetes mellitus without complications: Secondary | ICD-10-CM

## 2016-08-24 DIAGNOSIS — C778 Secondary and unspecified malignant neoplasm of lymph nodes of multiple regions: Secondary | ICD-10-CM | POA: Diagnosis not present

## 2016-08-24 NOTE — Progress Notes (Signed)
[place] OFFICE PROGRESS NOTE  Patient Care Team: Madelyn Brunner, MD as PCP - General (Internal Medicine) Cammie Sickle, MD as Consulting Physician (Oncology) Leona Singleton, RN as Oncology Nurse Navigator (Oncology)   SUMMARY OF ONCOLOGIC HISTORY:  Oncology History   # April-MAY 2017 SMALL CELL LUNG CANCER EXTENSIVE STAGE  LUL #1 4.1x 3.5cm;#2- 3.3x3.0  #3 Left pleural based nodule 74m [new]; Left apical radiation changes. RUL nodule- 12x10 mm-stable/benign; LEft supraclav LN/ right hilarLN/subcarinal LN/ Left axillary LN; MAY 17th 2017- START Carbo-Etop q3 W; June 23rd CT- Significant PR  # OCT 23rd CT- Mediastinal/right hilar progression; NOV 3th PET-local recurrenceRec RT  # Left Chest pain- Pal RT [may 17th]  # July 2017- R IJ DVT- s/p thrombolysis [Dr.Dew]- Lovenox; on xarelto  # [facility]- NEG/ Smoking  # SEP 2009- SQUAMOUS CELL CA LUL T2/T3 [Stage IIB] s/p Chemo-RT [decined Surgery]     Small cell lung cancer ([facility]   02/06/2016 Initial Diagnosis    [place] cell lung cancer ([place]       Primary malignant neoplasm of left upper lobe of lung ([facility]   04/18/2016 Initial Diagnosis    Primary malignant neoplasm of left upper lobe of lung ([facility])        INTERVAL HISTORY:  75year old male patient with extensive stage small cell lung cancer here to review the results of his restaging PET That was ordered given the concerns for recurrence on the CAT scan.  He has chronic shortness of breath especially the mornings. He has been using his nebulizer.  Chronic cough. No hemoptysis.  Patient has been taking his Xarelto; however he just has fewer pills available. Patient states his headaches are improved. Denies any neck pain; denies any significant headaches.  No fevers or chills. No hoarseness of voice.   REVIEW OF SYSTEMS:  A complete 10 point review of system is done which is negative except mentioned above/history of present illness.   PAST MEDICAL  HISTORY :  Past Medical History:  Diagnosis Date  . Asthma   . CML (chronic myelocytic leukemia) ([facility]   . COPD (chronic obstructive pulmonary disease) ([facility]   . Diabetes mellitus type II, controlled ([facility]   . Diabetes mellitus without complication ([facility]   . Hypertension   . 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 Lung cancer, upper lobe ([place] 2009  . Myocardial infarct   . Small cell lung cancer ([facility]     PAST SURGICAL HISTORY :   Past Surgical History:  Procedure Laterality Date  . PERIPHERAL VASCULAR CATHETERIZATION N/A 02/21/2016   Procedure: PGlori LuisCath Insertion;  Surgeon: JAlgernon Huxley MD;  [facility];  Service: Cardiovascular;  Laterality: N/A;  . PERIPHERAL VASCULAR CATHETERIZATION Right 04/19/2016   Procedure: Thrombectomy/thrombolysis right IJ DVT;  Surgeon: JAlgernon Huxley MD;  [facility];  Service: Cardiovascular;  Laterality: Right;  . PORT-A-CATH REMOVAL N/A 04/07/2016   Procedure: REMOVAL PORT-A-CATH;  Surgeon: VSerafina Mitchell MD;  Location: [facility];  Service: Vascular;  Laterality: N/A;  . THROAT SURGERY    . TUMOR REMOVAL Right    "years ago-right lower posterior side"    FAMILY HISTORY :   Family History  Problem Relation Age of Onset  . Cancer - Colon Father   . Cancer - Colon Sister     SOCIAL HISTORY:   Social History  Substance Use Topics  . Smoking status: Current Every Day Smoker  Packs/day: 1.00    Years: 62.00    Types: Cigarettes  . Smokeless tobacco: Never Used  . Alcohol use 3.6 oz/week    6 Cans of beer per week     Comment: rarely    ALLERGIES:  is allergic to clarithromycin and penicillins.  MEDICATIONS:  Current Outpatient Prescriptions  Medication Sig Dispense Refill  . albuterol (PROAIR HFA) 108 (90 Base) MCG/ACT inhaler Inhale 2 puffs into the lungs every 6 (six) hours as needed for wheezing or shortness of breath. 1 Inhaler 6  . albuterol (PROVENTIL) (2.5 MG/3ML) 0.083% nebulizer solution  Inhale 3 mLs into the lungs every 6 (six) hours as needed for wheezing or shortness of breath.     Marland Kitchen ipratropium-albuterol (DUONEB) 0.5-2.5 (3) MG/3ML SOLN Take 3 mLs by nebulization every 6 (six) hours as needed (shortness of breath). 360 mL 0  . atorvastatin (LIPITOR) 40 MG tablet Take 1 tablet (40 mg total) by mouth daily at 6 PM. (Patient not taking: Reported on 08/24/2016) 30 tablet 1  . metFORMIN (GLUCOPHAGE) 1000 MG tablet Take 1,000 mg by mouth 2 (two) times daily with a meal.     . predniSONE (DELTASONE) 10 MG tablet Label  & dispense according to the schedule below. 5 Pills PO for 2 days then, 4 Pills PO for 2 days, 3 Pills PO for 2 days, 2 Pills PO for 2 days, 1 Pill PO for 2 days then STOP. 30 tablet 0  . rivaroxaban (XARELTO) 20 MG TABS tablet Take 1 tablet (20 mg total) by mouth daily with supper. 30 tablet 11  . umeclidinium-vilanterol (ANORO ELLIPTA) 62.5-25 MCG/INH AEPB Inhale 1 puff into the lungs daily.     No current facility-administered medications for this visit.     PHYSICAL EXAMINATION: ECOG PERFORMANCE STATUS: 1 - Symptomatic but completely ambulatory  BP 125/76 (BP Location: Left Arm, Patient Position: Sitting)   Pulse 81   Temp 97.6 F (36.4 C) (Tympanic)   Resp 20   Ht '5\' 9"'$  (1.753 m)   Wt 189 lb (85.7 kg)   BMI 27.91 kg/m   Filed Weights   08/24/16 1015  Weight: 189 lb (85.7 kg)    GENERAL: Well-nourished well-developed; Alert, no distress and comfortable.  He is alone. EYES: no pallor or icterus OROPHARYNX: no thrush or ulceration; poor dentition; Bil ear wax  NECK: vague swelling under the neck; tender; no skin changes.  LYMPH:  no palpable lymphadenopathy in the cervical, axillary or inguinal regions LUNGS: Decreased air entry bilaterally.  No wheeze or crackles HEART/CVS: regular rate & rhythm and no murmurs; No lower extremity edema ABDOMEN:abdomen soft, non-tender and normal bowel sounds Musculoskeletal:no cyanosis of digits and no clubbing; NO  tenderness noted in the left chest wall. No lumps or bumps noted. PSYCH: alert & oriented x 3 with fluent speech NEURO: no focal motor/sensory deficits SKIN: Macular rash noted on the left arm.  LABORATORY DATA:  I have reviewed the data as listed    Component Value Date/Time   NA 135 08/06/2016 0945   K 4.2 08/06/2016 0945   CL 98 (L) 08/06/2016 0945   CO2 26 08/06/2016 0945   GLUCOSE 191 (H) 08/06/2016 0945   BUN 14 08/06/2016 0945   CREATININE 0.88 08/06/2016 0945   CREATININE 0.99 02/26/2014 1033   CALCIUM 8.8 (L) 08/06/2016 0945   CALCIUM 8.2 (L) 02/26/2014 1033   PROT 7.5 08/06/2016 0945   PROT 7.3 02/26/2014 1033   ALBUMIN 3.9 08/06/2016 0945   ALBUMIN  3.4 02/26/2014 1033   AST 18 08/06/2016 0945   AST 17 02/26/2014 1033   ALT 12 (L) 08/06/2016 0945   ALT 25 02/26/2014 1033   ALKPHOS 50 08/06/2016 0945   ALKPHOS 54 02/26/2014 1033   BILITOT 0.5 08/06/2016 0945   BILITOT 0.4 02/26/2014 1033   GFRNONAA >60 08/06/2016 0945   GFRNONAA >60 02/26/2014 1033   GFRAA >60 08/06/2016 0945   GFRAA >60 02/26/2014 1033    No results found for: SPEP, UPEP  Lab Results  Component Value Date   WBC 5.2 08/06/2016   NEUTROABS 3.3 08/06/2016   HGB 12.7 (L) 08/06/2016   HCT 37.6 (L) 08/06/2016   MCV 86.0 08/06/2016   PLT 202 08/06/2016      Chemistry      Component Value Date/Time   NA 135 08/06/2016 0945   K 4.2 08/06/2016 0945   CL 98 (L) 08/06/2016 0945   CO2 26 08/06/2016 0945   BUN 14 08/06/2016 0945   CREATININE 0.88 08/06/2016 0945   CREATININE 0.99 02/26/2014 1033      Component Value Date/Time   CALCIUM 8.8 (L) 08/06/2016 0945   CALCIUM 8.2 (L) 02/26/2014 1033   ALKPHOS 50 08/06/2016 0945   ALKPHOS 54 02/26/2014 1033   AST 18 08/06/2016 0945   AST 17 02/26/2014 1033   ALT 12 (L) 08/06/2016 0945   ALT 25 02/26/2014 1033   BILITOT 0.5 08/06/2016 0945   BILITOT 0.4 02/26/2014 1033     IMPRESSION: 1. Areas of chronic postradiation mass-like fibrosis in  the apex of the left upper lobe are stable compared to prior examinations. However, there is a persistent hypermetabolic medial left lower lobe mass as well as bilateral hilar and mediastinal hypermetabolic lymphadenopathy, as above, compatible with residual disease. 2. No infra diaphragmatic spread of disease into the abdomen or pelvis. 3. Additional incidental findings, similar prior studies, as above.   Electronically Signed   By: Vinnie Langton M.D.   On: 08/22/2016 16:02 ASSESSMENT & PLAN:   Primary malignant neoplasm of left upper lobe of lung ([place]) # Small cell lung cancer- Extensive stage small cell lung cancer- s/p # 4 cycle.CT Oct 23rd- progression in mediastinum/ right hilum. PET Local recurrence/ progression.  I think it's reasonable for palliative RT to the areas of recurrence; discussed with Dr.Crystal. Plan for sim early next week.    Discussed the use of chemotherapy; patient continues to be reluctant at this time for chemotherapy.  # DVT of right neck/ IJ-  Improved/ Neck pain- improved;  S/p thrombolysis; continue xarelto; patient will likely need lifelong anti-coagulation.  # Recommend follow up in 4 weeks/labs.   # I reviewed PET scan with the patient/family in detail; also reviewed the imaging independently [as summarized above]; and with the patient in detail.     Cammie Sickle, MD 08/24/2016 12:16 PM

## 2016-08-24 NOTE — Assessment & Plan Note (Addendum)
#   Small cell lung cancer- Extensive stage small cell lung cancer- s/p # 4 cycle.CT Oct 23rd- progression in mediastinum/ right hilum. PET Local recurrence/ progression.  I think it's reasonable for palliative RT to the areas of recurrence; discussed with Dr.Crystal. Plan for sim early next week.    Discussed the use of chemotherapy; patient continues to be reluctant at this time for chemotherapy.  # DVT of right neck/ IJ-  Improved/ Neck pain- improved;  S/p thrombolysis; continue xarelto; patient will likely need lifelong anti-coagulation.  # Recommend follow up in 4 weeks/labs.   # I reviewed PET scan with the patient/family in detail; also reviewed the imaging independently [as summarized above]; and with the patient in detail.

## 2016-08-27 ENCOUNTER — Ambulatory Visit
Admission: RE | Admit: 2016-08-27 | Discharge: 2016-08-27 | Disposition: A | Discharge status: Home / Self Care | Payer: Medicare Other | Source: Ambulatory Visit | Attending: Radiation Oncology | Admitting: Radiation Oncology

## 2016-08-27 DIAGNOSIS — Z510 Encounter for antineoplastic radiation therapy: Secondary | ICD-10-CM | POA: Diagnosis not present

## 2016-09-03 DIAGNOSIS — Z510 Encounter for antineoplastic radiation therapy: Secondary | ICD-10-CM | POA: Diagnosis not present

## 2016-09-05 ENCOUNTER — Other Ambulatory Visit: Payer: Self-pay | Admitting: *Deleted

## 2016-09-05 DIAGNOSIS — C3412 Malignant neoplasm of upper lobe, left bronchus or lung: Secondary | ICD-10-CM

## 2016-09-06 ENCOUNTER — Other Ambulatory Visit: Payer: Self-pay | Admitting: *Deleted

## 2016-09-06 ENCOUNTER — Telehealth: Payer: Self-pay | Admitting: *Deleted

## 2016-09-06 MED ORDER — RIVAROXABAN 20 MG PO TABS: 20.0000 mg | ORAL_TABLET | Freq: Every day | ORAL | 0 refills | Status: DC

## 2016-09-06 NOTE — Telephone Encounter (Signed)
Needs another months worth of Xarelto assistance. He will have insurance coverage in January

## 2016-09-07 NOTE — Telephone Encounter (Signed)
Rx sent to [facility] and will be paid for by fund

## 2016-09-11 ENCOUNTER — Ambulatory Visit: Payer: Medicare Other

## 2016-09-12 ENCOUNTER — Ambulatory Visit: Payer: Medicare Other

## 2016-09-12 ENCOUNTER — Ambulatory Visit
Admission: RE | Admit: 2016-09-12 | Discharge: 2016-09-12 | Disposition: A | Discharge status: Home / Self Care | Payer: Medicare Other | Source: Ambulatory Visit | Attending: Radiation Oncology | Admitting: Radiation Oncology

## 2016-09-13 ENCOUNTER — Ambulatory Visit: Payer: Medicare Other

## 2016-09-13 ENCOUNTER — Ambulatory Visit
Admission: RE | Admit: 2016-09-13 | Discharge: 2016-09-13 | Disposition: A | Discharge status: Home / Self Care | Payer: Medicare Other | Source: Ambulatory Visit | Attending: Radiation Oncology | Admitting: Radiation Oncology

## 2016-09-13 DIAGNOSIS — Z510 Encounter for antineoplastic radiation therapy: Secondary | ICD-10-CM | POA: Diagnosis not present

## 2016-09-14 ENCOUNTER — Ambulatory Visit: Payer: Medicare Other

## 2016-09-14 ENCOUNTER — Ambulatory Visit
Admission: RE | Admit: 2016-09-14 | Discharge: 2016-09-14 | Disposition: A | Discharge status: Home / Self Care | Payer: Medicare Other | Source: Ambulatory Visit | Attending: Radiation Oncology | Admitting: Radiation Oncology

## 2016-09-14 DIAGNOSIS — Z510 Encounter for antineoplastic radiation therapy: Secondary | ICD-10-CM | POA: Diagnosis not present

## 2016-09-17 ENCOUNTER — Ambulatory Visit: Payer: Medicare Other

## 2016-09-17 ENCOUNTER — Ambulatory Visit
Admission: RE | Admit: 2016-09-17 | Discharge: 2016-09-17 | Disposition: A | Discharge status: Home / Self Care | Payer: Medicare Other | Source: Ambulatory Visit | Attending: Radiation Oncology | Admitting: Radiation Oncology

## 2016-09-17 ENCOUNTER — Other Ambulatory Visit: Payer: Self-pay | Admitting: *Deleted

## 2016-09-17 DIAGNOSIS — Z510 Encounter for antineoplastic radiation therapy: Secondary | ICD-10-CM | POA: Diagnosis not present

## 2016-09-17 DIAGNOSIS — C3412 Malignant neoplasm of upper lobe, left bronchus or lung: Secondary | ICD-10-CM

## 2016-09-17 MED ORDER — SUCRALFATE 1 G PO TABS: 1.0000 g | ORAL_TABLET | Freq: Three times a day (TID) | ORAL | 6 refills | Status: DC

## 2016-09-17 MED ORDER — SUCRALFATE 1 G PO TABS: 1.0000 g | ORAL_TABLET | Freq: Three times a day (TID) | ORAL | Status: AC

## 2016-09-17 MED ORDER — AZITHROMYCIN 250 MG PO TABS: ORAL_TABLET | ORAL | 0 refills | Status: DC

## 2016-09-18 ENCOUNTER — Ambulatory Visit
Admission: RE | Admit: 2016-09-18 | Discharge: 2016-09-18 | Disposition: A | Discharge status: Home / Self Care | Payer: Medicare Other | Source: Ambulatory Visit | Attending: Radiation Oncology | Admitting: Radiation Oncology

## 2016-09-18 ENCOUNTER — Inpatient Hospital Stay: Payer: Medicare Other

## 2016-09-18 ENCOUNTER — Ambulatory Visit: Payer: Medicare Other

## 2016-09-18 DIAGNOSIS — Z510 Encounter for antineoplastic radiation therapy: Secondary | ICD-10-CM | POA: Diagnosis not present

## 2016-09-19 ENCOUNTER — Ambulatory Visit
Admission: RE | Admit: 2016-09-19 | Discharge: 2016-09-19 | Disposition: A | Discharge status: Home / Self Care | Payer: Medicare Other | Source: Ambulatory Visit | Attending: Radiation Oncology | Admitting: Radiation Oncology

## 2016-09-19 ENCOUNTER — Inpatient Hospital Stay: Payer: Medicare Other | Attending: Internal Medicine

## 2016-09-19 ENCOUNTER — Ambulatory Visit: Payer: Medicare Other

## 2016-09-19 DIAGNOSIS — E119 Type 2 diabetes mellitus without complications: Secondary | ICD-10-CM | POA: Insufficient documentation

## 2016-09-19 DIAGNOSIS — Z923 Personal history of irradiation: Secondary | ICD-10-CM | POA: Insufficient documentation

## 2016-09-19 DIAGNOSIS — Z7901 Long term (current) use of anticoagulants: Secondary | ICD-10-CM | POA: Insufficient documentation

## 2016-09-19 DIAGNOSIS — C778 Secondary and unspecified malignant neoplasm of lymph nodes of multiple regions: Secondary | ICD-10-CM | POA: Insufficient documentation

## 2016-09-19 DIAGNOSIS — Z86718 Personal history of other venous thrombosis and embolism: Secondary | ICD-10-CM | POA: Diagnosis not present

## 2016-09-19 DIAGNOSIS — Z7951 Long term (current) use of inhaled steroids: Secondary | ICD-10-CM | POA: Diagnosis not present

## 2016-09-19 DIAGNOSIS — R0602 Shortness of breath: Secondary | ICD-10-CM | POA: Diagnosis not present

## 2016-09-19 DIAGNOSIS — F17210 Nicotine dependence, cigarettes, uncomplicated: Secondary | ICD-10-CM | POA: Diagnosis not present

## 2016-09-19 DIAGNOSIS — Z9221 Personal history of antineoplastic chemotherapy: Secondary | ICD-10-CM | POA: Insufficient documentation

## 2016-09-19 DIAGNOSIS — C3412 Malignant neoplasm of upper lobe, left bronchus or lung: Secondary | ICD-10-CM | POA: Insufficient documentation

## 2016-09-19 DIAGNOSIS — Z79899 Other long term (current) drug therapy: Secondary | ICD-10-CM | POA: Diagnosis not present

## 2016-09-19 DIAGNOSIS — C7801 Secondary malignant neoplasm of right lung: Secondary | ICD-10-CM | POA: Diagnosis not present

## 2016-09-19 DIAGNOSIS — Z800 Family history of malignant neoplasm of digestive organs: Secondary | ICD-10-CM | POA: Insufficient documentation

## 2016-09-19 DIAGNOSIS — Z7984 Long term (current) use of oral hypoglycemic drugs: Secondary | ICD-10-CM | POA: Insufficient documentation

## 2016-09-19 DIAGNOSIS — C9210 Chronic myeloid leukemia, BCR/ABL-positive, not having achieved remission: Secondary | ICD-10-CM | POA: Insufficient documentation

## 2016-09-19 DIAGNOSIS — I252 Old myocardial infarction: Secondary | ICD-10-CM | POA: Diagnosis not present

## 2016-09-19 DIAGNOSIS — I10 Essential (primary) hypertension: Secondary | ICD-10-CM | POA: Insufficient documentation

## 2016-09-19 DIAGNOSIS — Z510 Encounter for antineoplastic radiation therapy: Secondary | ICD-10-CM | POA: Diagnosis not present

## 2016-09-19 LAB — CBC WITH DIFFERENTIAL/PLATELET
BASOS ABS: 0.1 10*3/uL (ref 0–0.1)
Basophils Relative: 2 %
Eosinophils Absolute: 0.1 10*3/uL (ref 0–0.7)
Eosinophils Relative: 2 %
HEMATOCRIT: 38.2 % — AB (ref 40.0–52.0)
HEMOGLOBIN: 12.6 g/dL — AB (ref 13.0–18.0)
LYMPHS PCT: 14 %
Lymphs Abs: 0.8 10*3/uL — ABNORMAL LOW (ref 1.0–3.6)
MCH: 27.8 pg (ref 26.0–34.0)
MCHC: 33 g/dL (ref 32.0–36.0)
MCV: 84 fL (ref 80.0–100.0)
Monocytes Absolute: 0.4 10*3/uL (ref 0.2–1.0)
Monocytes Relative: 8 %
NEUTROS ABS: 4.1 10*3/uL (ref 1.4–6.5)
NEUTROS PCT: 74 %
Platelets: 240 10*3/uL (ref 150–440)
RBC: 4.55 MIL/uL (ref 4.40–5.90)
RDW: 14.4 % (ref 11.5–14.5)
WBC: 5.6 10*3/uL (ref 3.8–10.6)

## 2016-09-19 LAB — COMPREHENSIVE METABOLIC PANEL
ALBUMIN: 3.7 g/dL (ref 3.5–5.0)
ALT: 10 U/L — ABNORMAL LOW (ref 17–63)
ANION GAP: 10 (ref 5–15)
AST: 18 U/L (ref 15–41)
Alkaline Phosphatase: 46 U/L (ref 38–126)
BILIRUBIN TOTAL: 0.5 mg/dL (ref 0.3–1.2)
BUN: 13 mg/dL (ref 6–20)
CO2: 30 mmol/L (ref 22–32)
Calcium: 9.1 mg/dL (ref 8.9–10.3)
Chloride: 95 mmol/L — ABNORMAL LOW (ref 101–111)
Creatinine, Ser: 0.96 mg/dL (ref 0.61–1.24)
GFR calc Af Amer: >60 mL/min (ref >60)
GLUCOSE: 190 mg/dL — AB (ref 65–99)
POTASSIUM: 3.9 mmol/L (ref 3.5–5.1)
Sodium: 135 mmol/L (ref 135–145)
TOTAL PROTEIN: 7.7 g/dL (ref 6.5–8.1)

## 2016-09-20 ENCOUNTER — Ambulatory Visit: Payer: Medicare Other

## 2016-09-20 ENCOUNTER — Ambulatory Visit
Admission: RE | Admit: 2016-09-20 | Discharge: 2016-09-20 | Disposition: A | Discharge status: Home / Self Care | Payer: Medicare Other | Source: Ambulatory Visit | Attending: Radiation Oncology | Admitting: Radiation Oncology

## 2016-09-20 DIAGNOSIS — Z510 Encounter for antineoplastic radiation therapy: Secondary | ICD-10-CM | POA: Diagnosis not present

## 2016-09-21 ENCOUNTER — Ambulatory Visit: Payer: Medicare Other

## 2016-09-21 ENCOUNTER — Inpatient Hospital Stay (HOSPITAL_BASED_OUTPATIENT_CLINIC_OR_DEPARTMENT_OTHER): Payer: Medicare Other | Admitting: Internal Medicine

## 2016-09-21 ENCOUNTER — Ambulatory Visit
Admission: RE | Admit: 2016-09-21 | Discharge: 2016-09-21 | Disposition: A | Discharge status: Home / Self Care | Payer: Medicare Other | Source: Ambulatory Visit | Attending: Radiation Oncology | Admitting: Radiation Oncology

## 2016-09-21 ENCOUNTER — Inpatient Hospital Stay: Payer: Medicare Other

## 2016-09-21 VITALS — BP 117/70 | HR 83 | Temp 96.9°F | Resp 22

## 2016-09-21 DIAGNOSIS — Z86718 Personal history of other venous thrombosis and embolism: Secondary | ICD-10-CM | POA: Diagnosis not present

## 2016-09-21 DIAGNOSIS — C3412 Malignant neoplasm of upper lobe, left bronchus or lung: Secondary | ICD-10-CM

## 2016-09-21 DIAGNOSIS — Z510 Encounter for antineoplastic radiation therapy: Secondary | ICD-10-CM | POA: Diagnosis not present

## 2016-09-21 DIAGNOSIS — C778 Secondary and unspecified malignant neoplasm of lymph nodes of multiple regions: Secondary | ICD-10-CM | POA: Diagnosis not present

## 2016-09-21 DIAGNOSIS — E119 Type 2 diabetes mellitus without complications: Secondary | ICD-10-CM

## 2016-09-21 DIAGNOSIS — Z800 Family history of malignant neoplasm of digestive organs: Secondary | ICD-10-CM

## 2016-09-21 DIAGNOSIS — Z923 Personal history of irradiation: Secondary | ICD-10-CM

## 2016-09-21 DIAGNOSIS — Z79899 Other long term (current) drug therapy: Secondary | ICD-10-CM

## 2016-09-21 DIAGNOSIS — C7801 Secondary malignant neoplasm of right lung: Secondary | ICD-10-CM | POA: Diagnosis not present

## 2016-09-21 DIAGNOSIS — Z7984 Long term (current) use of oral hypoglycemic drugs: Secondary | ICD-10-CM

## 2016-09-21 DIAGNOSIS — I252 Old myocardial infarction: Secondary | ICD-10-CM

## 2016-09-21 DIAGNOSIS — R0602 Shortness of breath: Secondary | ICD-10-CM

## 2016-09-21 DIAGNOSIS — F17210 Nicotine dependence, cigarettes, uncomplicated: Secondary | ICD-10-CM

## 2016-09-21 DIAGNOSIS — C9210 Chronic myeloid leukemia, BCR/ABL-positive, not having achieved remission: Secondary | ICD-10-CM

## 2016-09-21 DIAGNOSIS — Z7901 Long term (current) use of anticoagulants: Secondary | ICD-10-CM

## 2016-09-21 DIAGNOSIS — Z7951 Long term (current) use of inhaled steroids: Secondary | ICD-10-CM

## 2016-09-21 DIAGNOSIS — Z9221 Personal history of antineoplastic chemotherapy: Secondary | ICD-10-CM

## 2016-09-21 DIAGNOSIS — I10 Essential (primary) hypertension: Secondary | ICD-10-CM

## 2016-09-21 NOTE — Progress Notes (Signed)
[place] OFFICE PROGRESS NOTE  Patient Care Team: Madelyn Brunner, MD as PCP - General (Internal Medicine) Cammie Sickle, MD as Consulting Physician (Oncology) Leona Singleton, RN as Oncology Nurse Navigator (Oncology)   SUMMARY OF ONCOLOGIC HISTORY:  Oncology History   # April-MAY 2017 SMALL CELL LUNG CANCER EXTENSIVE STAGE  LUL #1 4.1x 3.5cm;#2- 3.3x3.0  #3 Left pleural based nodule 74m [new]; Left apical radiation changes. RUL nodule- 12x10 mm-stable/benign; LEft supraclav LN/ right hilarLN/subcarinal LN/ Left axillary LN; MAY 17th 2017- START Carbo-Etop q3 W; June 23rd CT- Significant PR  # OCT 23rd CT- Mediastinal/right hilar progression; NOV 3th PET-local recurrenceRec RT  # Left Chest pain- Pal RT [may 17th]  # July 2017- R IJ DVT- s/p thrombolysis [Dr.Dew]- Lovenox; on xarelto  # [facility] MRI- NEG/ Smoking  # SEP 2009- SQUAMOUS CELL CA LUL T2/T3 [Stage IIB] s/p Chemo-RT [decined Surgery]     Small cell lung cancer ([facility]   02/06/2016 Initial Diagnosis    Small cell lung cancer ([facility]       Primary malignant neoplasm of left upper lobe of lung ([facility]   04/18/2016 Initial Diagnosis    Primary malignant neoplasm of left upper lobe of lung (HCC)       INTERVAL HISTORY:  75year old male patient with extensive stage small cell lung cancer-With local recurrence currently on palliative radiation is here for follow-up.  He has chronic shortness of breath especially the mornings. He has been using his nebulizer.  Chronic cough. No hemoptysis. Patient continues to be on Xarelto for his extensive right neck DVT. Denies any new onset of pain in the neck or headache. No bleeding.  No fevers or chills. No hoarseness of voice.   REVIEW OF SYSTEMS:  A complete 10 point review of system is done which is negative except mentioned above/history of present illness.   PAST MEDICAL HISTORY :  Past Medical History:  Diagnosis Date  . Asthma   . CML (chronic  myelocytic leukemia) ([facility]   . COPD (chronic obstructive pulmonary disease) ([facility]   . Diabetes mellitus type II, controlled ([facility] [place]   . Diabetes mellitus without complication ([facility] [place]   . Hypertension   . 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 Lung cancer, upper lobe ([facility] 2009  . Myocardial infarct   . Small cell lung cancer ([facility]     PAST SURGICAL HISTORY :   Past Surgical History:  Procedure Laterality Date  . PERIPHERAL VASCULAR CATHETERIZATION N/A 02/21/2016   Procedure: PGlori LuisCath Insertion;  Surgeon: JAlgernon Huxley MD;  [facility];  Service: Cardiovascular;  Laterality: N/A;  . PERIPHERAL VASCULAR CATHETERIZATION Right 04/19/2016   Procedure: Thrombectomy/thrombolysis right IJ DVT;  Surgeon: JAlgernon Huxley MD;  [facility];  Service: Cardiovascular;  Laterality: Right;  . PORT-A-CATH REMOVAL N/A 04/07/2016   Procedure: REMOVAL PORT-A-CATH;  Surgeon: VSerafina Mitchell MD;  Location: [facility];  Service: Vascular;  Laterality: N/A;  . THROAT SURGERY    . TUMOR REMOVAL Right    "years ago-right lower posterior side"    FAMILY HISTORY :   Family History  Problem Relation Age of Onset  . Cancer - Colon Father   . Cancer - Colon Sister     SOCIAL HISTORY:   Social History  Substance Use Topics  . Smoking status: Current Every Day Smoker    Packs/day: 1.00    Years: 62.00    Types: Cigarettes  . Smokeless  tobacco: Never Used  . Alcohol use 3.6 oz/week    6 Cans of beer per week     Comment: rarely    ALLERGIES:  is allergic to clarithromycin and penicillins.  MEDICATIONS:  Current Outpatient Prescriptions  Medication Sig Dispense Refill  . albuterol (PROAIR HFA) 108 (90 Base) MCG/ACT inhaler Inhale 2 puffs into the lungs every 6 (six) hours as needed for wheezing or shortness of breath. 1 Inhaler 6  . albuterol (PROVENTIL) (2.5 MG/3ML) 0.083% nebulizer solution Inhale 3 mLs into the lungs every 6 (six) hours as needed for wheezing or  shortness of breath.     Marland Kitchen azithromycin (ZITHROMAX Z-PAK) 250 MG tablet Follow package directions 6 each 0  . ipratropium-albuterol (DUONEB) 0.5-2.5 (3) MG/3ML SOLN Take 3 mLs by nebulization every 6 (six) hours as needed (shortness of breath). 360 mL 0  . metFORMIN (GLUCOPHAGE) 1000 MG tablet Take 1,000 mg by mouth 2 (two) times daily with a meal. Dissolve tablet in 4 tablespoons of warm water,  Swish and swallow    . rivaroxaban (XARELTO) 20 MG TABS tablet Take 1 tablet (20 mg total) by mouth daily with supper. 30 tablet 0   Current Facility-Administered Medications  Medication Dose Route Frequency Provider Last Rate Last Dose  . sucralfate (CARAFATE) tablet 1 g  1 g Oral TID WC & HS Noreene Filbert, MD        PHYSICAL EXAMINATION: ECOG PERFORMANCE STATUS: 1 - Symptomatic but completely ambulatory  BP 117/70   Pulse 83   Temp (!) 96.9 F (36.1 C) (Tympanic)   Resp (!) 22   SpO2 95%   There were no vitals filed for this visit.  GENERAL: Well-nourished well-developed; Alert, no distress and comfortable.  He is alone. EYES: no pallor or icterus OROPHARYNX: no thrush or ulceration; poor dentition; Bil ear wax  NECK: vague swelling under the neck; tender; no skin changes.  LYMPH:  no palpable lymphadenopathy in the cervical, axillary or inguinal regions LUNGS: Decreased air entry bilaterally.  No wheeze or crackles HEART/CVS: regular rate & rhythm and no murmurs; No lower extremity edema ABDOMEN:abdomen soft, non-tender and normal bowel sounds Musculoskeletal:no cyanosis of digits and no clubbing; NO tenderness noted in the left chest wall. No lumps or bumps noted. PSYCH: alert & oriented x 3 with fluent speech NEURO: no focal motor/sensory deficits SKIN: Macular rash noted on the left arm.  LABORATORY DATA:  I have reviewed the data as listed    Component Value Date/Time   NA 135 09/19/2016 1223   K 3.9 09/19/2016 1223   CL 95 (L) 09/19/2016 1223   CO2 30 09/19/2016 1223    GLUCOSE 190 (H) 09/19/2016 1223   BUN 13 09/19/2016 1223   CREATININE 0.96 09/19/2016 1223   CREATININE 0.99 02/26/2014 1033   CALCIUM 9.1 09/19/2016 1223   CALCIUM 8.2 (L) 02/26/2014 1033   PROT 7.7 09/19/2016 1223   PROT 7.3 02/26/2014 1033   ALBUMIN 3.7 09/19/2016 1223   ALBUMIN 3.4 02/26/2014 1033   AST 18 09/19/2016 1223   AST 17 02/26/2014 1033   ALT 10 (L) 09/19/2016 1223   ALT 25 02/26/2014 1033   ALKPHOS 46 09/19/2016 1223   ALKPHOS 54 02/26/2014 1033   BILITOT 0.5 09/19/2016 1223   BILITOT 0.4 02/26/2014 1033   GFRNONAA >60 09/19/2016 1223   GFRNONAA >60 02/26/2014 1033   GFRAA >60 09/19/2016 1223   GFRAA >60 02/26/2014 1033    No results found for: SPEP, UPEP  Lab  Results  Component Value Date   WBC 5.6 09/19/2016   NEUTROABS 4.1 09/19/2016   HGB 12.6 (L) 09/19/2016   HCT 38.2 (L) 09/19/2016   MCV 84.0 09/19/2016   PLT 240 09/19/2016      Chemistry      Component Value Date/Time   NA 135 09/19/2016 1223   K 3.9 09/19/2016 1223   CL 95 (L) 09/19/2016 1223   CO2 30 09/19/2016 1223   BUN 13 09/19/2016 1223   CREATININE 0.96 09/19/2016 1223   CREATININE 0.99 02/26/2014 1033      Component Value Date/Time   CALCIUM 9.1 09/19/2016 1223   CALCIUM 8.2 (L) 02/26/2014 1033   ALKPHOS 46 09/19/2016 1223   ALKPHOS 54 02/26/2014 1033   AST 18 09/19/2016 1223   AST 17 02/26/2014 1033   ALT 10 (L) 09/19/2016 1223   ALT 25 02/26/2014 1033   BILITOT 0.5 09/19/2016 1223   BILITOT 0.4 02/26/2014 1033     IMPRESSION: 1. Areas of chronic postradiation mass-like fibrosis in the apex of the left upper lobe are stable compared to prior examinations. However, there is a persistent hypermetabolic medial left lower lobe mass as well as bilateral hilar and mediastinal hypermetabolic lymphadenopathy, as above, compatible with residual disease. 2. No infra diaphragmatic spread of disease into the abdomen or pelvis. 3. Additional incidental findings, similar prior  studies, as above.   Electronically Signed   By: Vinnie Langton M.D.   On: 08/22/2016 16:02 ASSESSMENT & PLAN:   Primary malignant neoplasm of left upper lobe of lung ([place]) # Small cell lung cancer- Extensive stage small cell lung cancer- s/p # 4 cycle.CT Oct 23rd- progression in mediastinum/ right hilum. PET Local recurrence/ progression. Currently on On palliative RT [until dec 28th]. No clinical progression noted.  # DVT of right neck/ IJ-  Improved/ Neck pain- improved;  S/p thrombolysis; continue xarelto; patient will likely need lifelong anti-coagulation. Got approval for medication.   # Recommend follow up in 6 weeks/labs; will order CT at that visit.       Cammie Sickle, MD 09/23/2016 11:36 AM

## 2016-09-21 NOTE — Assessment & Plan Note (Addendum)
#   Small cell lung cancer- Extensive stage small cell lung cancer- s/p # 4 cycle.CT Oct 23rd- progression in mediastinum/ right hilum. PET Local recurrence/ progression. Currently on On palliative RT [until dec 28th]. No clinical progression noted.  # DVT of right neck/ IJ-  Improved/ Neck pain- improved;  S/p thrombolysis; continue xarelto; patient will likely need lifelong anti-coagulation. Got approval for medication.   # Recommend follow up in 6 weeks/labs; will order CT at that visit.

## 2016-09-21 NOTE — Progress Notes (Signed)
Patient was approved with [employer] for funding for xarelto. He has his copay card.

## 2016-09-24 ENCOUNTER — Ambulatory Visit
Admission: RE | Admit: 2016-09-24 | Discharge: 2016-09-24 | Disposition: A | Discharge status: Home / Self Care | Payer: Medicare Other | Source: Ambulatory Visit | Attending: Radiation Oncology | Admitting: Radiation Oncology

## 2016-09-24 ENCOUNTER — Ambulatory Visit: Payer: Medicare Other

## 2016-09-24 DIAGNOSIS — Z510 Encounter for antineoplastic radiation therapy: Secondary | ICD-10-CM | POA: Diagnosis not present

## 2016-09-25 ENCOUNTER — Ambulatory Visit
Admission: RE | Admit: 2016-09-25 | Discharge: 2016-09-25 | Disposition: A | Discharge status: Home / Self Care | Payer: Medicare Other | Source: Ambulatory Visit | Attending: Radiation Oncology | Admitting: Radiation Oncology

## 2016-09-25 ENCOUNTER — Inpatient Hospital Stay: Payer: Medicare Other

## 2016-09-25 ENCOUNTER — Ambulatory Visit: Payer: Medicare Other

## 2016-09-25 DIAGNOSIS — Z510 Encounter for antineoplastic radiation therapy: Secondary | ICD-10-CM | POA: Diagnosis not present

## 2016-09-26 ENCOUNTER — Ambulatory Visit
Admission: RE | Admit: 2016-09-26 | Discharge: 2016-09-26 | Disposition: A | Discharge status: Home / Self Care | Payer: Medicare Other | Source: Ambulatory Visit | Attending: Radiation Oncology | Admitting: Radiation Oncology

## 2016-09-26 DIAGNOSIS — Z510 Encounter for antineoplastic radiation therapy: Secondary | ICD-10-CM | POA: Diagnosis not present

## 2016-09-27 ENCOUNTER — Ambulatory Visit
Admission: RE | Admit: 2016-09-27 | Discharge: 2016-09-27 | Disposition: A | Discharge status: Home / Self Care | Payer: Medicare Other | Source: Ambulatory Visit | Attending: Radiation Oncology | Admitting: Radiation Oncology

## 2016-09-27 DIAGNOSIS — Z510 Encounter for antineoplastic radiation therapy: Secondary | ICD-10-CM | POA: Diagnosis not present

## 2016-09-28 ENCOUNTER — Ambulatory Visit
Admission: RE | Admit: 2016-09-28 | Discharge: 2016-09-28 | Disposition: A | Discharge status: Home / Self Care | Payer: Medicare Other | Source: Ambulatory Visit | Attending: Radiation Oncology | Admitting: Radiation Oncology

## 2016-09-28 DIAGNOSIS — Z510 Encounter for antineoplastic radiation therapy: Secondary | ICD-10-CM | POA: Diagnosis not present

## 2016-10-02 ENCOUNTER — Ambulatory Visit: Admission: RE | Admit: 2016-10-02 | Payer: Medicare Other | Source: Ambulatory Visit

## 2016-10-03 ENCOUNTER — Other Ambulatory Visit: Payer: Self-pay | Admitting: *Deleted

## 2016-10-03 ENCOUNTER — Ambulatory Visit: Payer: Medicare Other

## 2016-10-03 MED ORDER — RIVAROXABAN 20 MG PO TABS: 20.0000 mg | ORAL_TABLET | Freq: Every day | ORAL | 0 refills | Status: DC

## 2016-10-04 ENCOUNTER — Ambulatory Visit: Payer: Medicare Other

## 2016-10-05 ENCOUNTER — Ambulatory Visit: Payer: Medicare Other

## 2016-10-23 ENCOUNTER — Telehealth: Payer: Self-pay | Admitting: *Deleted

## 2016-10-23 NOTE — Telephone Encounter (Signed)
I personally spoke with patient. Advised pt to go to ED now. He states that he wanted to wait to later this evening before making a decision to going to ed-"after 6 or 8 pm". Pt advised to go to ED now and not wait. Explained that due to his h/o of copd, he needs to go to ED to have his shortness of breath evaluated. Pt stated that he agreed to go to ED and and thanked me for calling me personally.

## 2016-10-23 NOTE — Telephone Encounter (Signed)
Pt needs to be evaluated at the ER.

## 2016-10-23 NOTE — Telephone Encounter (Signed)
His condition has declined and he is not sleeping not breathing well. Stands at counter in kitchen with head bent over to sleep. Asking about what to do, go to ER (which he will refuse), call Dr Raul Del or move his appt here up. He used an entire inhaler up in 1 weeks time. Discussed how her mother and her would like Hospice, but he wont hear of it. They will discuss further and call hospice if they decide to give it a try. I advised she call Dr Raul Del regarding his breathing to see what suggestions he has to adjust his meds. She also mentioned that he may need a hospital bed and after discussing and deciding on Hospice will let us know if they need an order form [place] for it. She reported that her mother is early stages of dementia and that she is showing signs of a flair up.

## 2016-10-24 ENCOUNTER — Inpatient Hospital Stay
Admission: EM | Admit: 2016-10-24 | Discharge: 2016-11-01 | DRG: 193 | Disposition: A | Discharge status: Hospice | Payer: Medicare Other | Attending: Internal Medicine | Admitting: Internal Medicine

## 2016-10-24 ENCOUNTER — Emergency Department: Payer: Medicare Other

## 2016-10-24 DIAGNOSIS — R0600 Dyspnea, unspecified: Secondary | ICD-10-CM

## 2016-10-24 DIAGNOSIS — J440 Chronic obstructive pulmonary disease with acute lower respiratory infection: Secondary | ICD-10-CM | POA: Diagnosis present

## 2016-10-24 DIAGNOSIS — J9601 Acute respiratory failure with hypoxia: Secondary | ICD-10-CM

## 2016-10-24 DIAGNOSIS — I10 Essential (primary) hypertension: Secondary | ICD-10-CM | POA: Diagnosis present

## 2016-10-24 DIAGNOSIS — J9621 Acute and chronic respiratory failure with hypoxia: Secondary | ICD-10-CM | POA: Diagnosis present

## 2016-10-24 DIAGNOSIS — E785 Hyperlipidemia, unspecified: Secondary | ICD-10-CM | POA: Diagnosis present

## 2016-10-24 DIAGNOSIS — K219 Gastro-esophageal reflux disease without esophagitis: Secondary | ICD-10-CM | POA: Diagnosis present

## 2016-10-24 DIAGNOSIS — J189 Pneumonia, unspecified organism: Principal | ICD-10-CM | POA: Diagnosis present

## 2016-10-24 DIAGNOSIS — R6889 Other general symptoms and signs: Secondary | ICD-10-CM

## 2016-10-24 DIAGNOSIS — F17210 Nicotine dependence, cigarettes, uncomplicated: Secondary | ICD-10-CM | POA: Diagnosis present

## 2016-10-24 DIAGNOSIS — E119 Type 2 diabetes mellitus without complications: Secondary | ICD-10-CM | POA: Diagnosis present

## 2016-10-24 DIAGNOSIS — J9622 Acute and chronic respiratory failure with hypercapnia: Secondary | ICD-10-CM | POA: Diagnosis present

## 2016-10-24 DIAGNOSIS — Z7901 Long term (current) use of anticoagulants: Secondary | ICD-10-CM

## 2016-10-24 DIAGNOSIS — B958 Unspecified staphylococcus as the cause of diseases classified elsewhere: Secondary | ICD-10-CM | POA: Diagnosis present

## 2016-10-24 DIAGNOSIS — Z85118 Personal history of other malignant neoplasm of bronchus and lung: Secondary | ICD-10-CM | POA: Diagnosis not present

## 2016-10-24 DIAGNOSIS — Z79899 Other long term (current) drug therapy: Secondary | ICD-10-CM

## 2016-10-24 DIAGNOSIS — I82C11 Acute embolism and thrombosis of right internal jugular vein: Secondary | ICD-10-CM | POA: Diagnosis present

## 2016-10-24 DIAGNOSIS — Z9981 Dependence on supplemental oxygen: Secondary | ICD-10-CM | POA: Diagnosis not present

## 2016-10-24 DIAGNOSIS — R7989 Other specified abnormal findings of blood chemistry: Secondary | ICD-10-CM

## 2016-10-24 DIAGNOSIS — R778 Other specified abnormalities of plasma proteins: Secondary | ICD-10-CM

## 2016-10-24 DIAGNOSIS — R079 Chest pain, unspecified: Secondary | ICD-10-CM | POA: Diagnosis not present

## 2016-10-24 DIAGNOSIS — R2681 Unsteadiness on feet: Secondary | ICD-10-CM

## 2016-10-24 DIAGNOSIS — Z880 Allergy status to penicillin: Secondary | ICD-10-CM | POA: Diagnosis not present

## 2016-10-24 DIAGNOSIS — M1990 Unspecified osteoarthritis, unspecified site: Secondary | ICD-10-CM | POA: Diagnosis present

## 2016-10-24 DIAGNOSIS — Z515 Encounter for palliative care: Secondary | ICD-10-CM | POA: Diagnosis not present

## 2016-10-24 DIAGNOSIS — Y95 Nosocomial condition: Secondary | ICD-10-CM | POA: Diagnosis present

## 2016-10-24 DIAGNOSIS — J441 Chronic obstructive pulmonary disease with (acute) exacerbation: Secondary | ICD-10-CM | POA: Diagnosis present

## 2016-10-24 DIAGNOSIS — Z856 Personal history of leukemia: Secondary | ICD-10-CM | POA: Diagnosis not present

## 2016-10-24 DIAGNOSIS — Z7984 Long term (current) use of oral hypoglycemic drugs: Secondary | ICD-10-CM

## 2016-10-24 DIAGNOSIS — Z881 Allergy status to other antibiotic agents status: Secondary | ICD-10-CM | POA: Diagnosis not present

## 2016-10-24 DIAGNOSIS — J9602 Acute respiratory failure with hypercapnia: Secondary | ICD-10-CM | POA: Diagnosis not present

## 2016-10-24 DIAGNOSIS — Z66 Do not resuscitate: Secondary | ICD-10-CM | POA: Diagnosis not present

## 2016-10-24 DIAGNOSIS — J181 Lobar pneumonia, unspecified organism: Secondary | ICD-10-CM | POA: Diagnosis not present

## 2016-10-24 DIAGNOSIS — I2510 Atherosclerotic heart disease of native coronary artery without angina pectoris: Secondary | ICD-10-CM | POA: Diagnosis present

## 2016-10-24 DIAGNOSIS — Z7189 Other specified counseling: Secondary | ICD-10-CM

## 2016-10-24 DIAGNOSIS — I252 Old myocardial infarction: Secondary | ICD-10-CM | POA: Diagnosis not present

## 2016-10-24 DIAGNOSIS — J90 Pleural effusion, not elsewhere classified: Secondary | ICD-10-CM

## 2016-10-24 DIAGNOSIS — J449 Chronic obstructive pulmonary disease, unspecified: Secondary | ICD-10-CM | POA: Diagnosis not present

## 2016-10-24 DIAGNOSIS — J9600 Acute respiratory failure, unspecified whether with hypoxia or hypercapnia: Secondary | ICD-10-CM | POA: Diagnosis present

## 2016-10-24 LAB — CBC WITH DIFFERENTIAL/PLATELET
BASOS PCT: 0 %
Basophils Absolute: 0 10*3/uL (ref 0–0.1)
EOS ABS: 0 10*3/uL (ref 0–0.7)
EOS PCT: 1 %
HCT: 34.9 % — ABNORMAL LOW (ref 40.0–52.0)
Hemoglobin: 11.5 g/dL — ABNORMAL LOW (ref 13.0–18.0)
Lymphocytes Relative: 7 %
Lymphs Abs: 0.4 10*3/uL — ABNORMAL LOW (ref 1.0–3.6)
MCH: 27.6 pg (ref 26.0–34.0)
MCHC: 32.8 g/dL (ref 32.0–36.0)
MCV: 84.2 fL (ref 80.0–100.0)
MONO ABS: 0.2 10*3/uL (ref 0.2–1.0)
Monocytes Relative: 4 %
Neutro Abs: 5.9 10*3/uL (ref 1.4–6.5)
Neutrophils Relative %: 88 %
PLATELETS: 190 10*3/uL (ref 150–440)
RBC: 4.15 MIL/uL — ABNORMAL LOW (ref 4.40–5.90)
RDW: 15.5 % — AB (ref 11.5–14.5)
WBC: 6.6 10*3/uL (ref 3.8–10.6)

## 2016-10-24 LAB — INFLUENZA PANEL BY PCR (TYPE A & B)
INFLAPCR: NEGATIVE
INFLBPCR: NEGATIVE

## 2016-10-24 LAB — TROPONIN I
TROPONIN I: 0.05 ng/mL — AB (ref <0.03)
TROPONIN I: 0.05 ng/mL — AB (ref <0.03)

## 2016-10-24 LAB — BLOOD GAS, VENOUS
ACID-BASE EXCESS: 6.6 mmol/L — AB (ref 0.0–2.0)
BICARBONATE: 36.8 mmol/L — AB (ref 20.0–28.0)
O2 Saturation: 79.2 %
PH VEN: 7.25 (ref 7.250–7.430)
PO2 VEN: 51 mmHg — AB (ref 32.0–45.0)
Patient temperature: 37
pCO2, Ven: 84 mmHg (ref 44.0–60.0)

## 2016-10-24 LAB — COMPREHENSIVE METABOLIC PANEL
ALBUMIN: 3.9 g/dL (ref 3.5–5.0)
ALT: 13 U/L — ABNORMAL LOW (ref 17–63)
ANION GAP: 11 (ref 5–15)
AST: 25 U/L (ref 15–41)
Alkaline Phosphatase: 56 U/L (ref 38–126)
BILIRUBIN TOTAL: 0.8 mg/dL (ref 0.3–1.2)
BUN: 16 mg/dL (ref 6–20)
CHLORIDE: 96 mmol/L — AB (ref 101–111)
CO2: 32 mmol/L (ref 22–32)
Calcium: 9.2 mg/dL (ref 8.9–10.3)
Creatinine, Ser: 0.9 mg/dL (ref 0.61–1.24)
GFR calc Af Amer: >60 mL/min (ref >60)
GFR calc non Af Amer: >60 mL/min (ref >60)
GLUCOSE: 161 mg/dL — AB (ref 65–99)
POTASSIUM: 4.6 mmol/L (ref 3.5–5.1)
SODIUM: 139 mmol/L (ref 135–145)
TOTAL PROTEIN: 8.3 g/dL — AB (ref 6.5–8.1)

## 2016-10-24 LAB — GLUCOSE, CAPILLARY: Glucose-Capillary: 175 mg/dL — ABNORMAL HIGH (ref 65–99)

## 2016-10-24 LAB — PROTIME-INR
INR: 2.73
Prothrombin Time: 29.5 seconds — ABNORMAL HIGH (ref 11.4–15.2)

## 2016-10-24 LAB — LACTIC ACID, PLASMA
Lactic Acid, Venous: 1.8 mmol/L (ref 0.5–1.9)
Lactic Acid, Venous: 2.7 mmol/L (ref 0.5–1.9)
Lactic Acid, Venous: 1.9 mmol/L (ref 0.5–1.9)

## 2016-10-24 LAB — MRSA PCR SCREENING: MRSA by PCR: NEGATIVE

## 2016-10-24 LAB — PROCALCITONIN

## 2016-10-24 LAB — BRAIN NATRIURETIC PEPTIDE: B NATRIURETIC PEPTIDE 5: 663 pg/mL — AB (ref 0.0–100.0)

## 2016-10-24 MED ORDER — SODIUM CHLORIDE 0.9 % IV SOLN: 250.0000 mL | INTRAVENOUS | Status: DC | PRN

## 2016-10-24 MED ORDER — IPRATROPIUM-ALBUTEROL 0.5-2.5 (3) MG/3ML IN SOLN
3.0000 mL | Freq: Four times a day (QID) | RESPIRATORY_TRACT | Status: DC
  Administered 2016-10-24 – 2016-10-30 (×23): 3 mL via RESPIRATORY_TRACT
  Filled 2016-10-24 (×23): qty 3

## 2016-10-24 MED ORDER — IPRATROPIUM-ALBUTEROL 0.5-2.5 (3) MG/3ML IN SOLN
3.0000 mL | Freq: Once | RESPIRATORY_TRACT | Status: AC
  Administered 2016-10-24: 3 mL via RESPIRATORY_TRACT
  Filled 2016-10-24: qty 3

## 2016-10-24 MED ORDER — CEFEPIME-DEXTROSE 2 GM/50ML IV SOLR
2.0000 g | Freq: Three times a day (TID) | INTRAVENOUS | Status: DC
  Administered 2016-10-24 – 2016-10-25 (×3): 2 g via INTRAVENOUS
  Filled 2016-10-24 (×4): qty 50

## 2016-10-24 MED ORDER — METHYLPREDNISOLONE SODIUM SUCC 40 MG IJ SOLR
40.0000 mg | Freq: Two times a day (BID) | INTRAMUSCULAR | Status: DC
  Administered 2016-10-24 – 2016-10-31 (×14): 40 mg via INTRAVENOUS
  Filled 2016-10-24 (×14): qty 1

## 2016-10-24 MED ORDER — ACETAMINOPHEN 325 MG PO TABS: 650.0000 mg | ORAL_TABLET | Freq: Four times a day (QID) | ORAL | Status: DC | PRN

## 2016-10-24 MED ORDER — RIVAROXABAN 20 MG PO TABS
20.0000 mg | ORAL_TABLET | Freq: Every day | ORAL | Status: DC
  Administered 2016-10-25 – 2016-10-31 (×6): 20 mg via ORAL
  Filled 2016-10-24 (×9): qty 1

## 2016-10-24 MED ORDER — ORAL CARE MOUTH RINSE
15.0000 mL | Freq: Two times a day (BID) | OROMUCOSAL | Status: DC
  Administered 2016-10-24 – 2016-10-31 (×14): 15 mL via OROMUCOSAL

## 2016-10-24 MED ORDER — ASPIRIN 81 MG PO CHEW: 324.0000 mg | CHEWABLE_TABLET | ORAL | Status: DC

## 2016-10-24 MED ORDER — ASPIRIN 300 MG RE SUPP: 300.0000 mg | RECTAL | Status: DC

## 2016-10-24 MED ORDER — SODIUM CHLORIDE 0.9 % IV BOLUS (SEPSIS)
250.0000 mL | Freq: Once | INTRAVENOUS | Status: AC
Start: 2016-10-24 — End: 2016-10-24
  Administered 2016-10-24: 250 mL via INTRAVENOUS

## 2016-10-24 MED ORDER — INSULIN ASPART 100 UNIT/ML ~~LOC~~ SOLN
0.0000 [IU] | SUBCUTANEOUS | Status: DC
  Administered 2016-10-24 – 2016-10-25 (×5): 2 [IU] via SUBCUTANEOUS
  Administered 2016-10-25 (×2): 1 [IU] via SUBCUTANEOUS
  Administered 2016-10-25: 2 [IU] via SUBCUTANEOUS
  Administered 2016-10-26: 1 [IU] via SUBCUTANEOUS
  Administered 2016-10-26 (×3): 2 [IU] via SUBCUTANEOUS
  Administered 2016-10-26 (×3): 1 [IU] via SUBCUTANEOUS
  Administered 2016-10-27: 3 [IU] via SUBCUTANEOUS
  Administered 2016-10-27: 1 [IU] via SUBCUTANEOUS
  Administered 2016-10-27: 2 [IU] via SUBCUTANEOUS
  Administered 2016-10-27 (×2): 1 [IU] via SUBCUTANEOUS
  Administered 2016-10-28: 2 [IU] via SUBCUTANEOUS
  Administered 2016-10-28: 3 [IU] via SUBCUTANEOUS
  Administered 2016-10-28 (×2): 2 [IU] via SUBCUTANEOUS
  Administered 2016-10-28: 1 [IU] via SUBCUTANEOUS
  Administered 2016-10-28 – 2016-10-29 (×3): 2 [IU] via SUBCUTANEOUS
  Administered 2016-10-29: 3 [IU] via SUBCUTANEOUS
  Administered 2016-10-29: 2 [IU] via SUBCUTANEOUS
  Administered 2016-10-29: 7 [IU] via SUBCUTANEOUS
  Administered 2016-10-30: 12:00:00 3 [IU] via SUBCUTANEOUS
  Administered 2016-10-30 (×2): 2 [IU] via SUBCUTANEOUS
  Administered 2016-10-30: 21:00:00 5 [IU] via SUBCUTANEOUS
  Administered 2016-10-30: 09:00:00 2 [IU] via SUBCUTANEOUS
  Administered 2016-10-31: 1 [IU] via SUBCUTANEOUS
  Administered 2016-10-31 (×4): 2 [IU] via SUBCUTANEOUS
  Administered 2016-10-31: 21:00:00 1 [IU] via SUBCUTANEOUS
  Administered 2016-11-01 (×2): 2 [IU] via SUBCUTANEOUS
  Filled 2016-10-24: qty 2
  Filled 2016-10-24: qty 1
  Filled 2016-10-24: qty 2
  Filled 2016-10-24: qty 3
  Filled 2016-10-24: qty 5
  Filled 2016-10-24 (×2): qty 1
  Filled 2016-10-24 (×5): qty 2
  Filled 2016-10-24: qty 1
  Filled 2016-10-24: qty 2
  Filled 2016-10-24 (×2): qty 1
  Filled 2016-10-24 (×4): qty 2
  Filled 2016-10-24: qty 1
  Filled 2016-10-24 (×2): qty 2
  Filled 2016-10-24: qty 1
  Filled 2016-10-24 (×6): qty 2
  Filled 2016-10-24: qty 1
  Filled 2016-10-24: qty 2
  Filled 2016-10-24: qty 1
  Filled 2016-10-24: qty 2
  Filled 2016-10-24: qty 3
  Filled 2016-10-24: qty 1
  Filled 2016-10-24: qty 7
  Filled 2016-10-24: qty 2
  Filled 2016-10-24 (×2): qty 3
  Filled 2016-10-24 (×2): qty 2
  Filled 2016-10-24: qty 1
  Filled 2016-10-24: qty 2

## 2016-10-24 MED ORDER — BUDESONIDE 0.25 MG/2ML IN SUSP
0.2500 mg | Freq: Two times a day (BID) | RESPIRATORY_TRACT | Status: DC
  Administered 2016-10-24 – 2016-10-30 (×12): 0.25 mg via RESPIRATORY_TRACT
  Filled 2016-10-24 (×12): qty 2

## 2016-10-24 MED ORDER — ENOXAPARIN SODIUM 40 MG/0.4ML ~~LOC~~ SOLN: 40.0000 mg | SUBCUTANEOUS | Status: DC

## 2016-10-24 NOTE — ED Provider Notes (Signed)
[facility] Emergency Department Provider Note    First MD Initiated Contact with Patient 10/24/16 1233     (approximate)  I have reviewed the triage vital signs and the nursing notes.   HISTORY  Chief Complaint Shortness of Breath  Level V Caveat:  Acute respiratory distress  HPI Richard Jimenez is a 76 y.o. male joint with a history of COPD, asthma as well as lung cancer on Xarelto presents with acute shortness of breath that is progressively worsening over the past 2 weeks. She recently completed radiation therapy as it was making his symptoms worse. Does wear oxygen at home as needed. EMS was called out due to persistent cough as well as chest pain and worsening shortness of breath. He is found to be 85% on on 2 L nasal cannula. He was given steroids as well as nebulizer treatment. He arrived to the ER in acute respiratory distress with tachypnea and tripoding.   Past Medical History:  Diagnosis Date  . Asthma   . CML (chronic myelocytic leukemia) ([employer])   . COPD (chronic obstructive pulmonary disease) ([place])   . Diabetes mellitus type II, controlled ([place])   . Diabetes mellitus without complication ([place])   . Hypertension   . 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 Lung cancer, upper lobe ([place]) 2009  . Myocardial infarct   . Small cell lung cancer (HCC)    Family History  Problem Relation Age of Onset  . Cancer - Colon Father   . Cancer - Colon Sister    Past Surgical History:  Procedure Laterality Date  . PERIPHERAL VASCULAR CATHETERIZATION N/A 02/21/2016   Procedure: Glori Luis Cath Insertion;  Surgeon: Algernon Huxley, MD;  Location: [place] CV LAB;  Service: Cardiovascular;  Laterality: N/A;  . PERIPHERAL VASCULAR CATHETERIZATION Right 04/19/2016   Procedure: Thrombectomy/thrombolysis right IJ DVT;  Surgeon: Algernon Huxley, MD;  Location: [place] [facility] LAB;  Service: Cardiovascular;  Laterality: Right;  . PORT-A-CATH REMOVAL N/A 04/07/2016   Procedure: REMOVAL PORT-A-CATH;  Surgeon: Serafina Mitchell, MD;  Location: [facility] ORS;  Service: Vascular;  Laterality: N/A;  . THROAT SURGERY    . TUMOR REMOVAL Right    "years ago-right lower posterior side"   Patient Active Problem List   Diagnosis Date Noted  . Elevated troponin I level   . Hypoxia   . Shortness of breath   . Acute respiratory failure ([place]) 05/18/2016  . NSTEMI (non-ST elevated myocardial infarction) ([place]) 05/18/2016  . Acute DVT (deep venous thrombosis) ([place]) 04/25/2016  . Primary malignant neoplasm of left upper lobe of lung ([place]) 04/18/2016  . DVT of axillary vein, acute right ([place]) 04/18/2016  . Frequent headaches 04/18/2016  . DVT (deep venous thrombosis), right 04/18/2016  . Jugular vein thrombosis, right   . Internal jugular vein thrombosis ([place]) 04/06/2016  . Neck pain 04/06/2016  . Hyponatremia 04/06/2016  . Anemia 04/06/2016  . Dehydration 04/04/2016  . Dysphagia 04/04/2016  . Small cell lung cancer ([place]) 02/06/2016  . Type 2 diabetes mellitus ([place]) 07/08/2015  . Lung cancer, upper lobe ([place]) 03/29/2015  . COPD exacerbation ([place]) 04/21/2012  . Breath shortness 04/21/2012  . H/O malignant neoplasm 04/21/2012  . H/O acute myocardial infarction 04/21/2012  . Arthritis, degenerative 04/21/2012  . BP (high blood pressure) 04/21/2012  . 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 Authorizing Provider  albuterol [facility] HFA)  108 (90 Base) MCG/ACT inhaler Inhale 2 puffs into the lungs every 6 (six) hours as needed for wheezing or shortness of breath. 07/02/16   Cammie Sickle, MD  albuterol (PROVENTIL) (2.5 MG/3ML) 0.083% nebulizer solution Inhale 3 mLs into the lungs every 6 (six) hours as needed for wheezing or shortness of breath.     Historical Provider, MD  azithromycin (ZITHROMAX Z-PAK) 250 MG tablet Follow package directions 09/17/16   Noreene Filbert, MD    ipratropium-albuterol (DUONEB) 0.5-2.5 (3) MG/3ML SOLN Take 3 mLs by nebulization every 6 (six) hours as needed (shortness of breath). 05/23/16   Henreitta Leber, MD  metFORMIN (GLUCOPHAGE) 1000 MG tablet Take 1,000 mg by mouth 2 (two) times daily with a meal. Dissolve tablet in 4 tablespoons of warm water,  Swish and swallow    Historical Provider, MD  rivaroxaban (XARELTO) 20 MG TABS tablet Take 1 tablet (20 mg total) by mouth daily with supper. 10/03/16   Cammie Sickle, MD    Allergies Clarithromycin and Penicillins    Social History Social History  Substance Use Topics  . Smoking status: Current Every Day Smoker    Packs/day: 1.00    Years: 62.00    Types: Cigarettes  . Smokeless tobacco: Never Used  . Alcohol use 3.6 oz/week    6 Cans of beer per week     Comment: rarely    Review of Systems Patient denies headaches, rhinorrhea, blurry vision, numbness, shortness of breath, chest pain, edema, cough, abdominal pain, nausea, vomiting, diarrhea, dysuria, fevers, rashes or hallucinations unless otherwise stated above in HPI. ____________________________________________   PHYSICAL EXAM:  VITAL SIGNS: Vitals:   10/24/16 1600 10/24/16 1605  BP: (!) 143/81 (!) 143/81  Pulse: (!) 107 (!) 106  Resp: (!) 24 20  Temp:      Constitutional: Alert and oriented.critically ill appearing in moderate respiratory distress Eyes: Conjunctivae are normal. PERRL. EOMI. Head: Atraumatic. Nose: No congestion/rhinnorhea. Mouth/Throat: Mucous membranes are moist.  Oropharynx non-erythematous. Neck: No stridor. Painless ROM. No cervical spine tenderness to palpation Hematological/Lymphatic/Immunilogical: No cervical lymphadenopathy. Cardiovascular: tachycardic rate, regular rhythm. Grossly normal heart sounds.  Good peripheral circulation. Respiratory:  Labored breathing, diminished breathsound in right posterior bases with rhonchorus breathsounds on the left Gastrointestinal: Soft  and nontender. No distention. No abdominal bruits. No CVA tenderness. Genitourinary:  Musculoskeletal: No lower extremity tenderness nor edema.  No joint effusions. Neurologic:  Normal speech and language. No gross focal neurologic deficits are appreciated. No gait instability. Skin:  Skin is warm, dry and intact. No rash noted. Psychiatric: Mood and affect are normal. Speech and behavior are normal.  ____________________________________________   LABS (all labs ordered are listed, but only abnormal results are displayed)  Results for orders placed or performed during the hospital encounter of 10/24/16 (from the past 24 hour(s))  Blood gas, venous     Status: Abnormal   Collection Time: 10/24/16 12:39 PM  Result Value Ref Range   pH, Ven 7.25 7.250 - 7.430   pCO2, Ven 84 (HH) 44.0 - 60.0 mmHg   pO2, Ven 51.0 (H) 32.0 - 45.0 mmHg   Bicarbonate 36.8 (H) 20.0 - 28.0 mmol/L   Acid-Base Excess 6.6 (H) 0.0 - 2.0 mmol/L   O2 Saturation 79.2 %   Patient temperature 37.0    Collection site VEIN    Sample type VENIPUNCTURE   Lactic acid, plasma     Status: None   Collection Time: 10/24/16 12:52 PM  Result Value Ref Range  Lactic Acid, Venous 1.8 0.5 - 1.9 mmol/L  Troponin I     Status: Abnormal   Collection Time: 10/24/16 12:52 PM  Result Value Ref Range   Troponin I 0.05 (HH) <0.03 ng/mL  Brain natriuretic peptide     Status: Abnormal   Collection Time: 10/24/16 12:52 PM  Result Value Ref Range   B Natriuretic Peptide 663.0 (H) 0.0 - 100.0 pg/mL  Protime-INR     Status: Abnormal   Collection Time: 10/24/16 12:52 PM  Result Value Ref Range   Prothrombin Time 29.5 (H) 11.4 - 15.2 seconds   INR 2.73   Influenza panel by PCR (type A & B)     Status: None   Collection Time: 10/24/16  1:05 PM  Result Value Ref Range   Influenza A By PCR NEGATIVE NEGATIVE   Influenza B By PCR NEGATIVE NEGATIVE  CBC with Differential/Platelet     Status: Abnormal   Collection Time: 10/24/16  2:19 PM   Result Value Ref Range   WBC 6.6 3.8 - 10.6 K/uL   RBC 4.15 (L) 4.40 - 5.90 MIL/uL   Hemoglobin 11.5 (L) 13.0 - 18.0 g/dL   HCT 34.9 (L) 40.0 - 52.0 %   MCV 84.2 80.0 - 100.0 fL   MCH 27.6 26.0 - 34.0 pg   MCHC 32.8 32.0 - 36.0 g/dL   RDW 15.5 (H) 11.5 - 14.5 %   Platelets 190 150 - 440 K/uL   Neutrophils Relative % 88 %   Neutro Abs 5.9 1.4 - 6.5 K/uL   Lymphocytes Relative 7 %   Lymphs Abs 0.4 (L) 1.0 - 3.6 K/uL   Monocytes Relative 4 %   Monocytes Absolute 0.2 0.2 - 1.0 K/uL   Eosinophils Relative 1 %   Eosinophils Absolute 0.0 0 - 0.7 K/uL   Basophils Relative 0 %   Basophils Absolute 0.0 0 - 0.1 K/uL  Comprehensive metabolic panel     Status: Abnormal   Collection Time: 10/24/16  2:19 PM  Result Value Ref Range   Sodium 139 135 - 145 mmol/L   Potassium 4.6 3.5 - 5.1 mmol/L   Chloride 96 (L) 101 - 111 mmol/L   CO2 32 22 - 32 mmol/L   Glucose, Bld 161 (H) 65 - 99 mg/dL   BUN 16 6 - 20 mg/dL   Creatinine, Ser 0.90 0.61 - 1.24 mg/dL   Calcium 9.2 8.9 - 10.3 mg/dL   Total Protein 8.3 (H) 6.5 - 8.1 g/dL   Albumin 3.9 3.5 - 5.0 g/dL   AST 25 15 - 41 U/L   ALT 13 (L) 17 - 63 U/L   Alkaline Phosphatase 56 38 - 126 U/L   Total Bilirubin 0.8 0.3 - 1.2 mg/dL   GFR calc non Af Amer >60 >60 mL/min   GFR calc Af Amer >60 >60 mL/min   Anion gap 11 5 - 15   ____________________________________________  EKG My review and personal interpretation at Time: 12:35   Indication: sob  Rate: 120  Rhythm: sinus Axis: lbbb Other: no sgarbossa criteria ____________________________________________  RADIOLOGY  I personally reviewed all radiographic images ordered to evaluate for the above acute complaints and reviewed radiology reports and findings.  These findings were personally discussed with the patient.  Please see medical record for radiology report. ____________________________________________   PROCEDURES  Procedure(s) performed:  Procedures    Critical Care performed:  yes CRITICAL CARE Performed by: Merlyn Lot   Total critical care time: 40 minutes  Critical  care time was exclusive of separately billable procedures and treating other patients.  Critical care was necessary to treat or prevent imminent or life-threatening deterioration.  Critical care was time spent personally by me on the following activities: development of treatment plan with patient and/or surrogate as well as nursing, discussions with consultants, evaluation of patient's response to treatment, examination of patient, obtaining history from patient or surrogate, ordering and performing treatments and interventions, ordering and review of laboratory studies, ordering and review of radiographic studies, pulse oximetry and re-evaluation of patient's condition.  ____________________________________________   INITIAL IMPRESSION / ASSESSMENT AND PLAN / ED COURSE  Pertinent labs & imaging results that were available during my care of the patient were reviewed by me and considered in my medical decision making (see chart for details).  DDX: Asthma, copd, CHF, pna, ptx, malignancy, Pe, anemia   PARAG DORTON is a 76 y.o. who presents to the ED with presents with acute respiratory failure requiring placement on BiPAP. Patient afebrile but tachycardic and with rhonchorous breath sounds. Patient with initial treatment for a combined COPD versus pneumonia. Chest x-ray shows evidence of probable left-sided pneumonia. EKG abnormal and trop mildly elevated but patient denies active chest pain. Don't believe this is ACS based on clinical presentation. Do believe this is less consistent with acute congestive heart failure as the patient is not hypertensive and does not appear volume overloaded. Patient did have some improvement after nebulizer treatments. Patient with an acute hypercapnia which will continue to be improved with BiPAP. The patient was started on IV antibiotics due to concern for  healthcare associated pneumonia.  After observation in the ER the patient did have clinical improvement but remains critically ill. I spoke with critical care service positive for patient will require admission to the ICU for further evaluation and management.  Have discussed with the patient and available family all diagnostics and treatments performed thus far and all questions were answered to the best of my ability. The patient demonstrates understanding and agreement with plan.     Clinical Course      ____________________________________________   FINAL CLINICAL IMPRESSION(S) / ED DIAGNOSES  Final diagnoses:  Acute respiratory failure with hypercapnia (HCC)  Healthcare-associated pneumonia  Elevated troponin I level      NEW MEDICATIONS STARTED DURING THIS VISIT:  New Prescriptions   No medications on file     Note:  This document was prepared using Dragon voice recognition software and may include unintentional dictation errors.    Merlyn Lot, MD 10/24/16 [phone]

## 2016-10-24 NOTE — Progress Notes (Signed)
ANTICOAGULATION CONSULT NOTE - Initial Consult  Pharmacy Consult for Rivaroxaban  Indication: Hx of Thrombosis of right internal jugular vein   Allergies  Allergen Reactions  . Clarithromycin Nausea And Vomiting  . Penicillins Nausea And Vomiting, Rash and Other (See Comments)    Has patient had a PCN reaction causing immediate rash, facial/tongue/throat swelling, SOB or lightheadedness with hypotension: No Has patient had a PCN reaction causing severe rash involving mucus membranes or skin necrosis: No Has patient had a PCN reaction that required hospitalization No Has patient had a PCN reaction occurring within the last 10 years: No If all of the above answers are "NO", then may proceed with Cephalosporin use.    Patient Measurements: Height: '5\' 9"'$  (175.3 cm) Weight: 197 lb (89.4 kg) IBW/kg (Calculated) : 70.7  Recent Labs  10/24/16 1252 10/24/16 1419  HGB  --  11.5*  HCT  --  34.9*  PLT  --  190  LABPROT 29.5*  --   INR 2.73  --   CREATININE  --  0.90  TROPONINI 0.05*  --     Estimated Creatinine Clearance: 79.6 mL/min (by C-G formula based on SCr of 0.9 mg/dL).   Medical History: Past Medical History:  Diagnosis Date  . Asthma   . CML (chronic myelocytic leukemia) ([place])   . COPD (chronic obstructive pulmonary disease) ([place])   . Diabetes mellitus type II, controlled ([place])   . Diabetes mellitus without complication ([place])   . Hypertension   . 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 Lung cancer, upper lobe ([place]) 2009  . Myocardial infarct   . Small cell lung cancer [facility])     Assessment: 76 yo male with Hx of Thrombosis of right internal jugular vein. Patient started on Xarelto in December. Pharmacy consulted for Xarelto dosing while inpatient.    Plan:  Will continue patient's home dose of Xarelto '20mg'$  daily with meals. Enoxaparin has been discontinued.   Pernell Dupre, PharmD, BCPS Clinical Pharmacist 10/24/2016 4:00 PM

## 2016-10-24 NOTE — ED Notes (Signed)
Respiratory called to place pt on bi-pap - Dr Quentin Cornwall at bedside

## 2016-10-24 NOTE — H&P (Signed)
PULMONARY / CRITICAL CARE MEDICINE   Name: Richard Jimenez MRN: [id number] DOB: November 12, 1940    ADMISSION DATE:  10/24/2016 CONSULTATION DATE:  10/24/2016  REFERRING MD:  Dr. 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  [facility] c/o worsening shortness of breath, wheezing, and coughing onset 2 weeks prior to presentation to ER.  Due to worsening symptoms EMS notified upon arrival O2 sats were 85% with exertion.  EMS administered a total of 4 breathing treatments and 125 mg solumedrol.  In the ER pts O2 sats on nonrebreather were 85% therefore he was placed on Bipap.  He has used an entire inhaler in 1 week due to symptoms without relief. He recently stopped radiation 3 weeks ago because he states it was making him worse.  He denies sick contacts, fever, nausea, or vomiting.  PCCM contacted to admit pt on 01/17 for acute on chronic hypoxic hypercapnic respiratory failure secondary to AECOPD and ?LLL Pneumonia requiring Bipap.  PAST MEDICAL HISTORY :  He  has a past medical history of Asthma; [place]; COPD (chronic obstructive [place] disease) [place]; Diabetes mellitus type II, controlled ([place]; Diabetes mellitus without complication ([place]; Hypertension; Jugular vein thrombosis, right (04/2016); Lung [place] ([place]); Myocardial infarct; and [place].  PAST SURGICAL HISTORY: He  has a past surgical  history that includes Throat surgery; Tumor removal (Right); Cardiac catheterization (N/A, 02/21/2016); Port-a-cath removal (N/A, 04/07/2016); and Cardiac catheterization (Right, 04/19/2016).  Allergies  Allergen Reactions  . Clarithromycin Nausea And Vomiting  . Penicillins Nausea And Vomiting, Rash and Other (See Comments)    Has patient had a PCN reaction causing immediate rash, facial/tongue/throat swelling, SOB or lightheadedness with hypotension: No Has patient had a PCN reaction causing severe rash involving mucus membranes or skin necrosis: No Has patient had a PCN reaction that required hospitalization No Has patient had a PCN reaction occurring within the last 10 years: No If all of the above answers are "NO", then may proceed with Cephalosporin use.    Current Facility-Administered Medications on File Prior to Encounter  Medication  . sucralfate (CARAFATE) tablet 1 g   Current Outpatient Prescriptions on File Prior to Encounter  Medication Sig  . albuterol (PROAIR HFA) 108 (90 Base) MCG/ACT inhaler Inhale 2 puffs into the lungs every 6 (six) hours as needed for wheezing or shortness of breath.  .Marland Kitchenalbuterol (PROVENTIL) (2.5 MG/3ML) 0.083% nebulizer solution Inhale 3 mLs into the lungs every 6 (six) hours as needed for wheezing or shortness of breath.   .Marland Kitchenazithromycin (ZITHROMAX Z-PAK) 250 MG tablet Follow package directions  . ipratropium-albuterol (DUONEB) 0.5-2.5 (3) MG/3ML SOLN Take 3 mLs by nebulization every 6 (six) hours as needed (shortness of breath).  . metFORMIN (GLUCOPHAGE) 1000 MG tablet Take 1,000 mg by mouth 2 (two) times daily with a meal. Dissolve tablet in 4 tablespoons of warm water,  Swish and swallow  . rivaroxaban (XARELTO) 20 MG TABS tablet Take 1  tablet (20 mg total) by mouth daily with supper.    FAMILY HISTORY:  His indicated that his father is deceased. He indicated that his sister is alive.    SOCIAL HISTORY: He  reports that he has been smoking  Cigarettes.  He has a 62.00 pack-year smoking history. He has never used smokeless tobacco. He reports that he drinks about 3.6 oz of alcohol per week . He reports that he does not use drugs.  REVIEW OF SYSTEMS:  Positives in BOLD  Gen: Denies fever, chills, weight change, fatigue, night sweats HEENT: Denies blurred vision, double vision, hearing loss, tinnitus, sinus congestion, rhinorrhea, sore throat, neck stiffness, dysphagia PULM: shortness of breath, cough, sputum production, hemoptysis, wheezing CV: Denies chest pain, edema, orthopnea, paroxysmal nocturnal dyspnea, palpitations GI: Denies abdominal pain, nausea, vomiting, diarrhea, hematochezia, melena, constipation, change in bowel habits GU: Denies dysuria, hematuria, polyuria, oliguria, urethral discharge Endocrine: Denies hot or cold intolerance, polyuria, polyphagia or appetite change Derm: Denies rash, dry skin, scaling or peeling skin change Heme: Denies easy bruising, bleeding, bleeding gums Neuro: Denies headache, numbness, weakness, slurred speech, loss of memory or consciousness   SUBJECTIVE:  Pt states breathing has improved since he was placed on Bipap  VITAL SIGNS: BP (!) 141/87   Pulse (!) 109   Temp 97.8 F (36.6 C) (Oral)   Resp (!) 25   Ht '5\' 9"'$  (1.753 m)   Wt 89.4 kg (197 lb)   SpO2 96%   BMI 29.09 kg/m   HEMODYNAMICS:    VENTILATOR SETTINGS:    INTAKE / OUTPUT: No intake/output data recorded.  PHYSICAL EXAMINATION: General:  Chronically ill appearing Caucasian male  Neuro:  Alert and oriented, follows commands, PERRLA HEENT:  Supple, no JVD Cardiovascular: NSR, s1s2, no M/R/G Lungs:  Faint expiratory wheezes throughout, even, non labored on Bipap Abdomen: +BS x4, soft, non tender, non distended  Musculoskeletal:  Normal bulk and tone, no edema Skin:  Intact no rashes or lesions  LABS:  BMET  Recent Labs Lab 10/24/16 1419  NA 139  K 4.6  CL 96*  CO2 32  BUN 16  CREATININE 0.90   GLUCOSE 161*    Electrolytes  Recent Labs Lab 10/24/16 1419  CALCIUM 9.2    CBC  Recent Labs Lab 10/24/16 1419  WBC 6.6  HGB 11.5*  HCT 34.9*  PLT 190    Coag's  Recent Labs Lab 10/24/16 1252  INR 2.73    Sepsis Markers  Recent Labs Lab 10/24/16 1252  LATICACIDVEN 1.8    ABG No results for input(s): PHART, PCO2ART, PO2ART in the last 168 hours.  Liver Enzymes  Recent Labs Lab 10/24/16 1419  AST 25  ALT 13*  ALKPHOS 56  BILITOT 0.8  ALBUMIN 3.9    Cardiac Enzymes  Recent Labs Lab 10/24/16 1252  TROPONINI 0.05*    Glucose No results for input(s): GLUCAP in the last 168 hours.  Imaging Dg Chest Portable 1 View  Result Date: 10/24/2016 CLINICAL DATA:  Shortness of breath increased over last 2 weeks, history lung cancer, stop radiation therapy, having dry cough, history COPD, hypertension, diabetes mellitus EXAM: PORTABLE CHEST 1 VIEW COMPARISON:  05/18/2016 FINDINGS: Normal heart size, mediastinal contours and pulmonary vascularity. Persistent LEFT apex opacity suspect postradiation fibrosis chain this. New infiltrate identified throughout LEFT lung question pneumonia. New RIGHT basilar effusion and atelectasis. No pneumothorax. Bones demineralized. IMPRESSION: Chronic LEFT apex changes with new LEFT lung infiltrate question pneumonia. Additional atelectasis and new fusion at RIGHT  lung base. Electronically Signed   By: Lavonia Dana M.D.   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), Recurrent  extensive small cell lung cancer (dx 2017), Asthma and Current tobacco abuse (1 pack per week) P:   Continuous Bipap for now wean as tolerated Maintain O2 sats 88% to 92% Scheduled bronchodilators IV steroids CXR in am   CARDIOVASCULAR A:  Mildly elevated troponin's likely demand ischemia secondary to respiratory failure Hx: Right jugular vein thrombosis, MI, Hyperlipidemia, HTN, and CAD  P:  Continuous telemetry monitoring Continue outpatient xarelto  Trend troponin's   RENAL A:   No acute issues  P:   Trend BMP's Replace electrolytes as indicated Monitor UOP  GASTROINTESTINAL A:   No acute issues  P:   Heart healthy diet once off Bipap  HEMATOLOGIC A:   Hx: Small cell lung cancer, CML, and Upper lobe lung cancer P:  Xarelto for VTE prophylaxis Trend CBC Monitor for s/sx of bleeding Transfuse for hgb <7  INFECTIOUS A:   ?LLL Pneumonia  P:   Trend WBC's and monitor fever curve Trend PCT's and lactic acid Continue abx as listed above Follow cultures   ENDOCRINE A:   Type II Diabetes Mellitus   P:   SSI  CBG's ac/hs  NEUROLOGIC A:   No acute issues  Hx: Osteoarthritis  P:   Avoid sedating medication Prn Tylenol for pain management    FAMILY  - Updates: No family at bedside to update 10/24/16  - Inter-disciplinary family meet or Palliative Care meeting due by:  10/31/2016  Marda Stalker, [place] Pager [phone] (please enter 7 digits) PCCM Consult Pager [phone] (please enter 7 digits)  Pt Seen and examined, agree with NP assessment and plan as detailed above. Lung bilat scattered wheeze.  Acute resp failure due to AECOPD/pneumonia. Review of CXR imaging shows RLL pneumonia with emphysema. Initial ABG shows acute on chronic hypercapnic resp failure. Thus far bld cx showing staph species.  Known recurrent SCLC with smoking, discussed importance of smoking cessation.   Marda Stalker, M.D.   10/25/2016  Critical Care Attestation.  I have personally obtained a history, examined the patient, evaluated laboratory and imaging results, formulated the assessment and plan and placed orders. The Patient requires high complexity decision making for assessment and support, frequent evaluation and titration of therapies, application of advanced monitoring technologies and extensive interpretation of multiple databases. The patient has critical illness that could lead imminently to failure of 1 or more organ systems and requires the highest level of physician preparedness to intervene.  Critical Care Time devoted to patient care services described in this note is 45 minutes and is exclusive of time spent in procedures.

## 2016-10-24 NOTE — ED Triage Notes (Signed)
Pt arrived via ems for c/o shortness of breath that has increased over the last 2 weeks - pt has a history of lung CA and has recently stopped radiation stating that it makes him worse - pt reports dry cough only when he lays down - denies productive cough - per ems pt desat to 85% when ambulated to restroom at home - pt repeatedly taking o2 off of face - ot was given two albuterol nebs at home and two duonebs in route - also given '125mg'$  solumedrol in route

## 2016-10-24 NOTE — Progress Notes (Signed)
Transferred pt from Er to ICU room 19 on the [place] with no complications, Amy RT notified of patients arrival and report given.

## 2016-10-24 NOTE — ED Notes (Signed)
Pt arrived via ems for c/o shortness of breath that has increased over the last 2 weeks - pt has a history of lung CA and has recently stopped radiation stating that it makes him worse - pt reports dry cough only when he lays down - denies productive cough - per ems pt desat to 85% when ambulated to restroom at home - pt repeatedly taking o2 off of face - ot was given two albuterol nebs at home and two duonebs in route - also given '125mg'$  solumedrol in route

## 2016-10-24 NOTE — Progress Notes (Signed)
Pharmacy Antibiotic Note  BRAND SIEVER is a 76 y.o. male admitted on 10/24/2016 with   pneumonia and acute respiratory failure.  Pharmacy has been consulted for cefepime dosing.  Plan: Will start patient on cefepime 2gm IV every 8 hours.   Height: '5\' 9"'$  (175.3 cm) Weight: 197 lb (89.4 kg) IBW/kg (Calculated) : 70.7  Temp (24hrs), Avg:97.8 F (36.6 C), Min:97.8 F (36.6 C), Max:97.8 F (36.6 C)   Recent Labs Lab 10/24/16 1252 10/24/16 1419  WBC  --  6.6  CREATININE  --  0.90  LATICACIDVEN 1.8  --     Estimated Creatinine Clearance: 79.6 mL/min (by C-G formula based on SCr of 0.9 mg/dL).    Allergies  Allergen Reactions  . Clarithromycin Nausea And Vomiting  . Penicillins Nausea And Vomiting, Rash and Other (See Comments)    Has patient had a PCN reaction causing immediate rash, facial/tongue/throat swelling, SOB or lightheadedness with hypotension: No Has patient had a PCN reaction causing severe rash involving mucus membranes or skin necrosis: No Has patient had a PCN reaction that required hospitalization No Has patient had a PCN reaction occurring within the last 10 years: No If all of the above answers are "NO", then may proceed with Cephalosporin use.    Antimicrobials this admission: 1/17 cefepime>>   Dose adjustments this admission:   Microbiology results: 1/17 Influenza: Negative  1/17 BCx: pending 1/17  MRSA PCR: pending  Thank you for allowing pharmacy to be a part of this patient's care.  Pernell Dupre, PharmD, BCPS Clinical Pharmacist 10/24/2016 4:07 PM

## 2016-10-24 NOTE — ED Notes (Signed)
Patient placed on Bipap

## 2016-10-25 ENCOUNTER — Inpatient Hospital Stay: Payer: Medicare Other

## 2016-10-25 LAB — BASIC METABOLIC PANEL
ANION GAP: 8 (ref 5–15)
BUN: 20 mg/dL (ref 6–20)
CALCIUM: 9.5 mg/dL (ref 8.9–10.3)
CHLORIDE: 98 mmol/L — AB (ref 101–111)
CO2: 33 mmol/L — AB (ref 22–32)
Creatinine, Ser: 0.58 mg/dL — ABNORMAL LOW (ref 0.61–1.24)
GFR calc Af Amer: >60 mL/min (ref >60)
GFR calc non Af Amer: >60 mL/min (ref >60)
GLUCOSE: 173 mg/dL — AB (ref 65–99)
Potassium: 4.8 mmol/L (ref 3.5–5.1)
Sodium: 139 mmol/L (ref 135–145)

## 2016-10-25 LAB — CBC
HEMATOCRIT: 36.3 % — AB (ref 40.0–52.0)
HEMOGLOBIN: 11.9 g/dL — AB (ref 13.0–18.0)
MCH: 27.4 pg (ref 26.0–34.0)
MCHC: 32.9 g/dL (ref 32.0–36.0)
MCV: 83.3 fL (ref 80.0–100.0)
Platelets: 214 10*3/uL (ref 150–440)
RBC: 4.36 MIL/uL — ABNORMAL LOW (ref 4.40–5.90)
RDW: 15.9 % — ABNORMAL HIGH (ref 11.5–14.5)
WBC: 8.5 10*3/uL (ref 3.8–10.6)

## 2016-10-25 LAB — GLUCOSE, CAPILLARY
GLUCOSE-CAPILLARY: 165 mg/dL — AB (ref 65–99)
GLUCOSE-CAPILLARY: 170 mg/dL — AB (ref 65–99)
Glucose-Capillary: 134 mg/dL — ABNORMAL HIGH (ref 65–99)
Glucose-Capillary: 134 mg/dL — ABNORMAL HIGH (ref 65–99)
Glucose-Capillary: 152 mg/dL — ABNORMAL HIGH (ref 65–99)
Glucose-Capillary: 156 mg/dL — ABNORMAL HIGH (ref 65–99)
Glucose-Capillary: 156 mg/dL — ABNORMAL HIGH (ref 65–99)
Glucose-Capillary: 168 mg/dL — ABNORMAL HIGH (ref 65–99)

## 2016-10-25 LAB — BLOOD CULTURE ID PANEL (REFLEXED)

## 2016-10-25 LAB — PHOSPHORUS: PHOSPHORUS: 4.1 mg/dL (ref 2.5–4.6)

## 2016-10-25 LAB — TROPONIN I
Troponin I: 0.04 ng/mL (ref <0.03)
Troponin I: 0.05 ng/mL (ref <0.03)

## 2016-10-25 LAB — PROCALCITONIN

## 2016-10-25 LAB — MAGNESIUM: Magnesium: 2.1 mg/dL (ref 1.7–2.4)

## 2016-10-25 MED ORDER — CEFAZOLIN SODIUM-DEXTROSE 2-4 GM/100ML-% IV SOLN
2.0000 g | Freq: Three times a day (TID) | INTRAVENOUS | Status: AC
  Administered 2016-10-25 – 2016-10-30 (×16): 2 g via INTRAVENOUS
  Filled 2016-10-25 (×17): qty 100

## 2016-10-25 NOTE — Progress Notes (Signed)
Pharmacy Note  Richard Jimenez is a 76 y.o. male with a h/o thrombosisi of right IJ vein admitted on 10/24/2016 with   pneumonia and acute respiratory failure.  Pharmacy has been consulted for cefepime and rivaroxaban.   Plan: 1. Blood cultures now with staph species in both sets. Change to cefazolin 2 g iv q 8 hours.   2. Continue rivaroxaban 20 mg daily.   Height: '5\' 10"'$  (177.8 cm) Weight: 188 lb 0.8 oz (85.3 kg) IBW/kg (Calculated) : 73  Temp (24hrs), Avg:98.1 F (36.7 C), Min:97.5 F (36.4 C), Max:99.3 F (37.4 C)   Recent Labs Lab 10/24/16 1252 10/24/16 1419 10/24/16 1733 10/24/16 2027 10/25/16 0715  WBC  --  6.6  --   --  8.5  CREATININE  --  0.90  --   --  0.58*  LATICACIDVEN 1.8  --  2.7* 1.9  --     Estimated Creatinine Clearance: 83.6 mL/min (by C-G formula based on SCr of 0.58 mg/dL (L)).    Allergies  Allergen Reactions  . Clarithromycin Nausea And Vomiting  . Penicillins Nausea And Vomiting, Rash and Other (See Comments)    Has patient had a PCN reaction causing immediate rash, facial/tongue/throat swelling, SOB or lightheadedness with hypotension: No Has patient had a PCN reaction causing severe rash involving mucus membranes or skin necrosis: No Has patient had a PCN reaction that required hospitalization No Has patient had a PCN reaction occurring within the last 10 years: No If all of the above answers are "NO", then may proceed with Cephalosporin use.    Antimicrobials this admission: 1/17 cefepime>> 1/18 1/18 cefazolin >>   Dose adjustments this admission:   Microbiology results: 1/17 Influenza: Negative  1/17 BCx: Staph species 2/2 1/17  MRSA PCR: pending  Thank you for allowing pharmacy to be a part of this patient's care.  Ulice Dash D, PharmD Clinical Pharmacist 10/25/2016 2:48 PM

## 2016-10-25 NOTE — Progress Notes (Signed)
Patient alert, titrated off of bi-pap and transitioned to high flow. He is currently on 60%. He has had a poor diet throughout shift. He desaturated with movement and activity. Reinforcing breathing techniques. Has been using urinal with adequate urine output. Palliative care consult entered for goals and treatment. Family has been at bedside.

## 2016-10-26 ENCOUNTER — Inpatient Hospital Stay: Payer: Medicare Other

## 2016-10-26 ENCOUNTER — Telehealth: Payer: Self-pay | Admitting: *Deleted

## 2016-10-26 DIAGNOSIS — J189 Pneumonia, unspecified organism: Principal | ICD-10-CM

## 2016-10-26 DIAGNOSIS — Z515 Encounter for palliative care: Secondary | ICD-10-CM

## 2016-10-26 DIAGNOSIS — J9602 Acute respiratory failure with hypercapnia: Secondary | ICD-10-CM

## 2016-10-26 DIAGNOSIS — J9601 Acute respiratory failure with hypoxia: Secondary | ICD-10-CM

## 2016-10-26 DIAGNOSIS — R079 Chest pain, unspecified: Secondary | ICD-10-CM

## 2016-10-26 DIAGNOSIS — Z66 Do not resuscitate: Secondary | ICD-10-CM

## 2016-10-26 DIAGNOSIS — Z7189 Other specified counseling: Secondary | ICD-10-CM

## 2016-10-26 DIAGNOSIS — J449 Chronic obstructive pulmonary disease, unspecified: Secondary | ICD-10-CM

## 2016-10-26 DIAGNOSIS — R0600 Dyspnea, unspecified: Secondary | ICD-10-CM

## 2016-10-26 LAB — POTASSIUM: POTASSIUM: 5 mmol/L (ref 3.5–5.1)

## 2016-10-26 LAB — BLOOD GAS, ARTERIAL
ACID-BASE EXCESS: 13.8 mmol/L — AB (ref 0.0–2.0)
BICARBONATE: 42.6 mmol/L — AB (ref 20.0–28.0)
Delivery systems: POSITIVE
Expiratory PAP: 8
FIO2: 0.6
INSPIRATORY PAP: 15
O2 SAT: 99.1 %
PATIENT TEMPERATURE: 37
PO2 ART: 144 mmHg — AB (ref 83.0–108.0)
pCO2 arterial: 79 mmHg (ref 32.0–48.0)
pH, Arterial: 7.34 — ABNORMAL LOW (ref 7.350–7.450)

## 2016-10-26 LAB — BASIC METABOLIC PANEL
Anion gap: 7 (ref 5–15)
BUN: 22 mg/dL — ABNORMAL HIGH (ref 6–20)
CHLORIDE: 97 mmol/L — AB (ref 101–111)
CO2: 35 mmol/L — ABNORMAL HIGH (ref 22–32)
CREATININE: 0.67 mg/dL (ref 0.61–1.24)
Calcium: 9.1 mg/dL (ref 8.9–10.3)
GFR calc Af Amer: >60 mL/min (ref >60)
GFR calc non Af Amer: >60 mL/min (ref >60)
Glucose, Bld: 138 mg/dL — ABNORMAL HIGH (ref 65–99)
Potassium: 4.6 mmol/L (ref 3.5–5.1)
SODIUM: 139 mmol/L (ref 135–145)

## 2016-10-26 LAB — CBC
HCT: 36 % — ABNORMAL LOW (ref 40.0–52.0)
HEMOGLOBIN: 11.7 g/dL — AB (ref 13.0–18.0)
MCH: 27.7 pg (ref 26.0–34.0)
MCHC: 32.5 g/dL (ref 32.0–36.0)
MCV: 85 fL (ref 80.0–100.0)
Platelets: 202 10*3/uL (ref 150–440)
RBC: 4.24 MIL/uL — AB (ref 4.40–5.90)
RDW: 16.1 % — ABNORMAL HIGH (ref 11.5–14.5)
WBC: 9.4 10*3/uL (ref 3.8–10.6)

## 2016-10-26 LAB — GLUCOSE, CAPILLARY
GLUCOSE-CAPILLARY: 133 mg/dL — AB (ref 65–99)
GLUCOSE-CAPILLARY: 149 mg/dL — AB (ref 65–99)
GLUCOSE-CAPILLARY: 156 mg/dL — AB (ref 65–99)
Glucose-Capillary: 143 mg/dL — ABNORMAL HIGH (ref 65–99)
Glucose-Capillary: 132 mg/dL — ABNORMAL HIGH (ref 65–99)
Glucose-Capillary: 191 mg/dL — ABNORMAL HIGH (ref 65–99)

## 2016-10-26 LAB — PROCALCITONIN

## 2016-10-26 LAB — TROPONIN I: Troponin I: 0.05 ng/mL (ref <0.03)

## 2016-10-26 LAB — MAGNESIUM: Magnesium: 2.3 mg/dL (ref 1.7–2.4)

## 2016-10-26 MED ORDER — LORAZEPAM 1 MG PO TABS
1.0000 mg | ORAL_TABLET | ORAL | Status: DC | PRN
  Administered 2016-10-26 – 2016-10-30 (×5): 1 mg via ORAL
  Filled 2016-10-26 (×3): qty 2
  Filled 2016-10-26: qty 1

## 2016-10-26 MED ORDER — MORPHINE SULFATE (CONCENTRATE) 10 MG/0.5ML PO SOLN
5.0000 mg | ORAL | Status: DC | PRN
  Administered 2016-10-27 – 2016-11-01 (×5): 5 mg via ORAL
  Filled 2016-10-26 (×5): qty 1

## 2016-10-26 NOTE — Progress Notes (Signed)
Pharmacy Note  Richard Jimenez is a 76 y.o. male with a h/o thrombosisi of right IJ vein admitted on 10/24/2016 with   pneumonia and acute respiratory failure.  Pharmacy has been consulted for cefazolin and rivaroxaban.   Plan: 1. Continue cefazolin 2 g iv q 8 hours.   2. Continue rivaroxaban 20 mg daily.   Height: '5\' 10"'$  (177.8 cm) Weight: 189 lb 9.5 oz (86 kg) IBW/kg (Calculated) : 73  Temp (24hrs), Avg:97.9 F (36.6 C), Min:97.2 F (36.2 C), Max:98.7 F (37.1 C)   Recent Labs Lab 10/24/16 1252 10/24/16 1419 10/24/16 1733 10/24/16 2027 10/25/16 0715 10/26/16 0631  WBC  --  6.6  --   --  8.5 9.4  CREATININE  --  0.90  --   --  0.58* 0.67  LATICACIDVEN 1.8  --  2.7* 1.9  --   --     Estimated Creatinine Clearance: 83.6 mL/min (by C-G formula based on SCr of 0.67 mg/dL).    Allergies  Allergen Reactions  . Clarithromycin Nausea And Vomiting  . Penicillins Nausea And Vomiting, Rash and Other (See Comments)    Has patient had a PCN reaction causing immediate rash, facial/tongue/throat swelling, SOB or lightheadedness with hypotension: No Has patient had a PCN reaction causing severe rash involving mucus membranes or skin necrosis: No Has patient had a PCN reaction that required hospitalization No Has patient had a PCN reaction occurring within the last 10 years: No If all of the above answers are "NO", then may proceed with Cephalosporin use.    Antimicrobials this admission: 1/17 cefepime>> 1/18 1/18 cefazolin >>   Dose adjustments this admission:   Microbiology results: 1/17 Influenza: Negative  1/17 BCx: Staph species 2/2 1/17  MRSA PCR: pending  Thank you for allowing pharmacy to be a part of this patient's care.  Ulice Dash D, PharmD Clinical Pharmacist 10/26/2016 11:30 AM

## 2016-10-26 NOTE — Telephone Encounter (Signed)
Patient is in hospital in ICU and she dropped off some FMLA papers and wants a DNR. Please call her to discuss

## 2016-10-26 NOTE — Progress Notes (Signed)
Patient alert throughout the day. We have gone back and fourth between high flow and bi-pap. During exertional activity patient becomes short of breath and drops his oxygen saturation levels. He requests to be placed back on bi-pap. Consult with palliative care occurred today between the patient, wife, and daughter. They have decided that they would like hospice care in the home. Care manager order placed. Patient has had a poor po intake, he has had adequate urine output. Patient and family to decide on what hospice agency they would like to use.

## 2016-10-26 NOTE — Consult Note (Signed)
Consultation Note Date: 10/26/2016   Patient Name: Richard Jimenez  DOB: 1941/07/22  MRN: [id number]  Age / Sex: 76 y.o., male  PCP: Madelyn Brunner, MD Referring Physician: Laverle Hobby, MD  Reason for Consultation: Establishing goals of care  HPI/Patient Profile: 76 y.o. male  with past medical history of small cell lung cancer in 2009 (s/p chemo and radiation) with recurrent extensive small cell lung cancer diagnosed in 2017, CAD, MI, COPD on home O2 at 3L, GERD, HLD, HTN, DM, chronic myelocytic leukemia, jugular vein thrombosis, and current smoker admitted on 10/24/2016 with shortness of breath, wheezing, and cough for 2 weeks. Initially placed on nonrebreather mask in ED and then BiPAP. Chest x-ray reveals RLL pneumonia and emphysema. Patient with acute on chronic respiratory failure secondary to COPD, lung cancer, and pneumonia. Receiving scheduled bronchodilators and IV steroids. Also receiving Ancef. Blood cultures gram-positive cocci. He has required intermittent BiPAP and high flow oxygen at 60%. Followed by Dr. Eual Fines family, the patient finished chemotherapy and was receiving palliative radiation which he has recently stopped due to worsening symptoms. Palliative medicine consultation for goals of care.   Clinical Assessment and Goals of Care: I have reviewed medical records, discussed with RN, and met with patient, wife and daughter Richard Jimenez) at bedside to discuss diagnosis,prognosis, [place], EOL wishes, disposition and options. Introduced Palliative Medicine as specialized medical care for people living with serious illness. It focuses on providing relief from the symptoms and stress of a serious illness. The goal is to improve quality of life for both the patient and the family.  We discussed a brief life review of the patient. Him and his wife have been married for 38 years. They have four  adult daughters (two from his previous marriage and two from her previous marriage). 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  Discussed current hospitalization and contributing co-morbidities (COPD and cancer). He has been on oxygen 3L at home. His lung cancer was diagnosed again in April 2017. Richard Jimenez tells me he completed chemotherapy and was receiving palliative radiation, which he recently stopped due to worsening symptoms, "more harm than good."   I attempted to elicit values and goals of care important to the patient knowing that he has a terminal disease with underlying COPD and acute on chronic respiratory failure. Richard Jimenez tells me being home and spending time with his family and dog are most important to him. The difference between aggressive medical intervention and comfort care was considered in light of the patient's goals of care. Knowing he will likely continue to decline, I explained to him that hospice services seem appropriate and fall in line with his wishes of being at home and spending time with family with the time he has left. [name] agrees with home hospice services just "would never want to go to a hospice facility."   At the beginning of my conversation, Richard Jimenez tells me he would not want to be on life support but would  want to be resuscitated. Educated on the fact that if he was resuscitated and survived, he would likely be intubated and placed on life support. We discussed this as a family and determined code status to be DNR and when the time comes, for him to die a natural death. He also tells me he would never want a feeding tube.    Discussed home hospice services. His wife worked for hospice for many years and they understand and agree with home hospice services. Discussed focus on comfort, quality, and dignity for whatever time he has left. He tells me he is not afraid to die and "has made my peace with the Reita Cliche." Discussed focus  on managing SOB/air hunger with prn morphine instead of escalating care with oxygen or focusing on oxygen saturations.   Questions and concerns were addressed.  Hard Choices booklet left for review. The family was encouraged to call with questions or concerns.    SUMMARY OF RECOMMENDATIONS    Discussed and educated on code status. Patient and family agree with DNR/DNI.   Family would like to continue current steroids/antibiotics per attending.   Agreeable to home hospice services-RN CM consult placed and discussed with Levada Dy.  Prior to discharge (per attending), patient to wean from high flow to nasal cannula. Please give prn morphine for dyspnea/tachypnea/air hunger. Focus will be symptom management and not oxygen saturations.  PMT not at [facility] over the weekend but will f/u next week if still hospitalized.   Code Status/Advance Care Planning:  DNR   Symptom Management:   Roxanol '5mg'$  PO q2h prn pain/dyspnea/air hunger  Ativan '1mg'$  PO q4h prn anxiety/sleep HS  Palliative Prophylaxis:   Aspiration, Delirium Protocol, Oral Care and Turn Reposition  Psycho-social/Spiritual:   Desire for further Chaplaincy support:yes  Additional Recommendations: Caregiving  Support/Resources and Education on Hospice  Prognosis:   Unable to determine  Discharge Planning: Home with Hospice      Primary Diagnoses: Present on Admission: . Acute respiratory failure ([facility])   I have reviewed the medical record, interviewed the patient and family, and examined the patient. The following aspects are pertinent.  Past Medical History:  Diagnosis Date  . Asthma   . CML (chronic myelocytic leukemia) ([place])   . COPD (chronic obstructive pulmonary disease) ([place])   . Diabetes mellitus type II, controlled ([place])   . Diabetes mellitus without complication ([place])   . Hypertension   . 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 Lung cancer, upper lobe ([place]) 2009  . Myocardial infarct   . Small cell  lung cancer [facility])    Social History   Social History  . Marital status: Married    Spouse name: N/A  . Number of children: N/A  . Years of education: N/A   Social History Main Topics  . Smoking status: Current Every Day Smoker    Packs/day: 1.00    Years: 62.00    Types: Cigarettes  . Smokeless tobacco: Never Used  . Alcohol use 3.6 oz/week    6 Cans of beer per week     Comment: rarely  . Drug use: No  . Sexual activity: Not Asked   Other Topics Concern  . None   Social History Narrative  . None   Family History  Problem Relation Age of Onset  . Cancer - Colon Father   . Cancer - Colon Sister    Scheduled Meds: . budesonide (PULMICORT) nebulizer solution  0.25 mg Nebulization BID  .  ceFAZolin (ANCEF) IV  2 g Intravenous Q8H  . insulin aspart  0-9 Units Subcutaneous Q4H  . ipratropium-albuterol  3 mL Nebulization Q6H  . mouth rinse  15 mL Mouth Rinse BID  . methylPREDNISolone (SOLU-MEDROL) injection  40 mg Intravenous Q12H  . rivaroxaban  20 mg Oral Q supper   Continuous Infusions: PRN Meds:.sodium chloride, acetaminophen, LORazepam, morphine CONCENTRATE Medications Prior to Admission:  Prior to Admission medications   Medication Sig Start Date End Date Taking? Authorizing Provider  albuterol (PROAIR HFA) 108 (90 Base) MCG/ACT inhaler Inhale 2 puffs into the lungs every 6 (six) hours as needed for wheezing or shortness of breath. 07/02/16  Yes Cammie Sickle, MD  ALPRAZolam Duanne Moron) 0.5 MG tablet Take 0.5 mg by mouth 3 (three) times daily as needed for anxiety.   Yes Historical Provider, MD  ipratropium-albuterol (DUONEB) 0.5-2.5 (3) MG/3ML SOLN Take 3 mLs by nebulization every 6 (six) hours as needed (shortness of breath). 05/23/16  Yes Henreitta Leber, MD  metFORMIN (GLUCOPHAGE) 1000 MG tablet Take 1,000 mg by mouth 2 (two) times daily with a meal. Dissolve tablet in 4 tablespoons of warm water,  Swish and swallow   Yes Historical Provider, MD  rivaroxaban  (XARELTO) 20 MG TABS tablet Take 1 tablet (20 mg total) by mouth daily with supper. 10/03/16  Yes Cammie Sickle, MD  traMADol (ULTRAM) 50 MG tablet Take 50 mg by mouth every 6 (six) hours as needed.   Yes Historical Provider, MD  triamcinolone cream (KENALOG) 0.1 % Apply 1 application topically 2 (two) times daily.   Yes Historical Provider, MD   Allergies  Allergen Reactions  . Clarithromycin Nausea And Vomiting  . Penicillins Nausea And Vomiting, Rash and Other (See Comments)    Has patient had a PCN reaction causing immediate rash, facial/tongue/throat swelling, SOB or lightheadedness with hypotension: No Has patient had a PCN reaction causing severe rash involving mucus membranes or skin necrosis: No Has patient had a PCN reaction that required hospitalization No Has patient had a PCN reaction occurring within the last 10 years: No If all of the above answers are "NO", then may proceed with Cephalosporin use.   Review of Systems  Constitutional: Positive for activity change and appetite change.  Respiratory: Positive for cough, shortness of breath and wheezing.   Neurological: Positive for weakness.    Physical Exam  Constitutional: He is oriented to person, place, and time. He appears well-developed. He is cooperative.  HENT:  Head: Normocephalic and atraumatic.  Cardiovascular: Regular rhythm and normal heart sounds.   Pulmonary/Chest: No accessory muscle usage. No tachypnea. He has decreased breath sounds.  Dyspnea at rest  Abdominal: Normal appearance.  Neurological: He is alert and oriented to person, place, and time.  Skin: Skin is warm and dry.  Psychiatric: He has a normal mood and affect. His speech is normal and behavior is normal. Cognition and memory are normal.  Nursing note and vitals reviewed.  Vital Signs: BP 132/78   Pulse (!) 101   Temp 97.5 F (36.4 C)   Resp (!) 26   Ht '5\' 10"'$  (1.778 m)   Wt 86 kg (189 lb 9.5 oz)   SpO2 98%   BMI 27.20 kg/m    Pain Assessment: No/denies pain   Pain Score: 0-No pain  SpO2: SpO2: 98 % O2 Device:SpO2: 98 % O2 Flow Rate: .O2 Flow Rate (L/min): 50 L/min  IO: Intake/output summary:   Intake/Output Summary (Last 24 hours) at 10/26/16 1621 Last data filed at  10/26/16 1500  Gross per 24 hour  Intake              150 ml  Output             1230 ml  Net            -1080 ml    LBM: Last BM Date: 10/24/16 Baseline Weight: Weight: 89.4 kg (197 lb) Most recent weight: Weight: 86 kg (189 lb 9.5 oz)     Palliative Assessment/Data: PPS 40%   Flowsheet Rows   Flowsheet Row Most Recent Value  Intake Tab  Referral Department  Critical care  Unit at Time of Referral  ICU  Palliative Care Primary Diagnosis  Pulmonary  Date Notified  10/24/16  Palliative Care Type  New Palliative care  Reason for referral  Clarify Goals of Care, Counsel Regarding Hospice  Date of Admission  10/24/16  Date first seen by Palliative Care  10/26/16  # of days Palliative referral response time  2 Day(s)  # of days IP prior to Palliative referral  0  Clinical Assessment  Palliative Performance Scale Score  40%  Psychosocial & Spiritual Assessment  Palliative Care Outcomes  Patient/Family meeting held?  Yes  Who was at the meeting?  patient, wife, and daughter Richard Jimenez)  [place] goals of care, Counseled regarding hospice, Provided end of life care assistance, Provided psychosocial or spiritual support, Transitioned to hospice      Time In: 1200 Time Out: 1315 Time Total: 77mn Greater than 50%  of this time was spent counseling and coordinating care related to the above assessment and plan.  Signed by:  MIhor Dow FNP-C Palliative Medicine Team  Phone: [phone]: [phone]  Please contact Palliative Medicine Team phone at [phone] questions and concerns.  For individual provider: See AShea Evans

## 2016-10-26 NOTE — Telephone Encounter (Addendum)
FMLA forms for pt's daughter, Richard Jimenez, received and given to [facility] for completion

## 2016-10-26 NOTE — Progress Notes (Signed)
[facility] Progess Note    ASSESSMENT/PLAN     PULMONARY Name: Richard Jimenez MRN: [id number] DOB: [birth year]    ADMISSION DATE:  10/24/2016   SUBJECTIVE:   No significant change over the last 24 hours. Patient remains on noninvasive ventilation with short breaks with high flow nasal oxygen. Poor by mouth intake, states he doesn't like the food.  Review of Systems:  Constitutional: Feels well. Cardiovascular: No chest pain.  Pulmonary: Denies dyspnea.   The remainder of systems were reviewed and were found to be negative other than what is documented in the HPI.    VITAL SIGNS: Temp:  [97.2 F (36.2 C)-98.7 F (37.1 C)] 97.5 F (36.4 C) (01/19 0700) Pulse Rate:  [86-119] 92 (01/19 0700) Resp:  [18-31] 22 (01/19 0700) BP: (117-146)/(63-91) 136/77 (01/19 0700) SpO2:  [89 %-95 %] 89 % (01/19 0700) FiO2 (%):  [40 %-60 %] 40 % (01/19 0309) Weight:  [86 kg (189 lb 9.5 oz)] 86 kg (189 lb 9.5 oz) (01/19 0412) HEMODYNAMICS:   VENTILATOR SETTINGS: FiO2 (%):  [40 %-60 %] 40 % INTAKE / OUTPUT:  Intake/Output Summary (Last 24 hours) at 10/26/16 0805 Last data filed at 10/26/16 0730  Gross per 24 hour  Intake              350 ml  Output             1130 ml  Net             -780 ml    PHYSICAL EXAMINATION: Physical Examination:   VS: BP 136/77   Pulse 92   Temp 97.5 F (36.4 C)   Resp (!) 22   Ht '5\' 10"'$  (1.778 m)   Wt 86 kg (189 lb 9.5 oz)   SpO2 (!) 89%   BMI 27.20 kg/m   General Appearance: No distress  Neuro:without focal findings, mental status normal. HEENT: PERRLA, EOM intact. Pulmonary: Markedly diminished breath sounds bilaterally with prolonged expiratory phase  CardiovascularNormal S1,S2.  No m/r/g.   Abdomen: Benign, Soft, non-tender. Renal:  No costovertebral tenderness  GU:  Not performed at this time. Endocrine: No evident thyromegaly. Skin:   warm, no rashes, no ecchymosis  Extremities: normal, no cyanosis,  clubbing.   LABS:   LABORATORY PANEL:   CBC  Recent Labs Lab 10/26/16 0631  WBC 9.4  HGB 11.7*  HCT 36.0*  PLT 202    Chemistries   Recent Labs Lab 10/24/16 1419 10/25/16 0715 10/26/16 0631  NA 139 139 139  K 4.6 4.8 4.6  CL 96* 98* 97*  CO2 32 33* 35*  GLUCOSE 161* 173* 138*  BUN 16 20 22*  CREATININE 0.90 0.58* 0.67  CALCIUM 9.2 9.5 9.1  MG  --  2.1  --   PHOS  --  4.1  --   AST 25  --   --   ALT 13*  --   --   ALKPHOS 56  --   --   BILITOT 0.8  --   --      Recent Labs Lab 10/25/16 1150 10/25/16 1553 10/25/16 1938 10/25/16 2336 10/26/16 0410 10/26/16 0714  GLUCAP 152* 170* 134* 168* 133* 149*   No results for input(s): PHART, PCO2ART, PO2ART in the last 168 hours.  Recent Labs Lab 10/24/16 1419  AST 25  ALT 13*  ALKPHOS 56  BILITOT 0.8  ALBUMIN 3.9    Cardiac Enzymes  Recent Labs Lab 10/25/16 0715  TROPONINI  0.05*    RADIOLOGY:  Dg Chest 1 View  Result Date: 10/26/2016 CLINICAL DATA:  Dyspnea EXAM: CHEST 1 VIEW COMPARISON:  Yesterday FINDINGS: Normal heart size. Stable asymmetric left apical and right basilar opacity. Left apical opacity is related to treated lung cancer. Right basilar opacity is likely pneumonia. Small pleural effusion on the right which may be loculated. There is a background of chronic lung disease with emphysema by CT. No pneumothorax. IMPRESSION: 1. Stable right basilar opacity favoring pneumonia. Small right pleural effusion. 2. Emphysema and treated left apical lung cancer. Electronically Signed   By: Monte Fantasia M.D.   On: 10/26/2016 06:44   Dg Chest Port 1 View  Result Date: 10/25/2016 CLINICAL DATA:  Acute respiratory failure. EXAM: PORTABLE CHEST 1 VIEW COMPARISON:  Radiographs yesterday PET-CT eleventh [facility]ine FINDINGS: The heart size and mediastinal contours are unchanged. Chronic left apical opacity. Interstitial opacities throughout the left lung are unchanged. Unchanged right lung base opacity and  right pleural effusion. No pneumothorax. Unchanged osseous structures. IMPRESSION: Changed appearance of the chest. Diffuse left lung opacities are unchanged and may reflect pneumonia or less likely pulmonary edema. Chronic left apical opacity. Unchanged right lung base opacity and pleural fluid. Electronically Signed   By: Jeb Levering M.D.   On: 10/25/2016 05:13   Dg Chest Portable 1 View  Result Date: 10/24/2016 CLINICAL DATA:  Shortness of breath increased over last 2 weeks, history lung cancer, stop radiation therapy, having dry cough, history COPD, hypertension, diabetes mellitus EXAM: PORTABLE CHEST 1 VIEW COMPARISON:  05/18/2016 FINDINGS: Normal heart size, mediastinal contours and pulmonary vascularity. Persistent LEFT apex opacity suspect postradiation fibrosis chain this. New infiltrate identified throughout LEFT lung question pneumonia. New RIGHT basilar effusion and atelectasis. No pneumothorax. Bones demineralized. IMPRESSION: Chronic LEFT apex changes with new LEFT lung infiltrate question pneumonia. Additional atelectasis and new fusion at RIGHT lung base. Electronically Signed   By: Lavonia Dana M.D.   On: 10/24/2016 13:19   A/P Severe COPD. Patient is presently on albuterol, Atrovent, Pulmicort, Solu-Medrol. Has been requiring predominantly noninvasive ventilation with occasional periods of high flow nasal oxygen. Chest x-ray performed today reveals right basilar opacity with possible small effusion. Is on antibiotic coverage, Ancef. 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 He received chemotherapy and radiation therapy however was stopped prior to completing course secondary to worsening symptoms.  Right jugular vein thrombosis. Patient is presently receiving Xarelto  Blood sugars have been  stable on a Medrol  We'll continue to try to wean patient off of noninvasive ventilation with giving him more time on high flow nasal oxygen. Patient complaining of the food in the hospital and family will be able to bring in food for him. If able, will have patient out of bed to chair as tolerated    Hermelinda Dellen, DO [facility] Office Number: [phone]  Patricia Pesa, M.D.  Vilinda Boehringer, M.D.  Merton Border, M.D  10/26/2016

## 2016-10-26 NOTE — Care Management (Signed)
Met with patient to offer hospice agencies. List provided to patient. He would like to talk to family.

## 2016-10-27 LAB — PHOSPHORUS: Phosphorus: 4.3 mg/dL (ref 2.5–4.6)

## 2016-10-27 LAB — BASIC METABOLIC PANEL
ANION GAP: 6 (ref 5–15)
BUN: 26 mg/dL — AB (ref 6–20)
CALCIUM: 8.7 mg/dL — AB (ref 8.9–10.3)
CO2: 36 mmol/L — ABNORMAL HIGH (ref 22–32)
Chloride: 97 mmol/L — ABNORMAL LOW (ref 101–111)
Creatinine, Ser: 0.71 mg/dL (ref 0.61–1.24)
GFR calc Af Amer: >60 mL/min (ref >60)
GLUCOSE: 149 mg/dL — AB (ref 65–99)
Potassium: 4.7 mmol/L (ref 3.5–5.1)
Sodium: 139 mmol/L (ref 135–145)

## 2016-10-27 LAB — CBC
HCT: 34.3 % — ABNORMAL LOW (ref 40.0–52.0)
Hemoglobin: 11.2 g/dL — ABNORMAL LOW (ref 13.0–18.0)
MCH: 27.6 pg (ref 26.0–34.0)
MCHC: 32.8 g/dL (ref 32.0–36.0)
MCV: 84.2 fL (ref 80.0–100.0)
PLATELETS: 185 10*3/uL (ref 150–440)
RBC: 4.07 MIL/uL — ABNORMAL LOW (ref 4.40–5.90)
RDW: 15.9 % — ABNORMAL HIGH (ref 11.5–14.5)
WBC: 8.4 10*3/uL (ref 3.8–10.6)

## 2016-10-27 LAB — GLUCOSE, CAPILLARY
GLUCOSE-CAPILLARY: 139 mg/dL — AB (ref 65–99)
GLUCOSE-CAPILLARY: 156 mg/dL — AB (ref 65–99)
Glucose-Capillary: 141 mg/dL — ABNORMAL HIGH (ref 65–99)
Glucose-Capillary: 139 mg/dL — ABNORMAL HIGH (ref 65–99)
Glucose-Capillary: 133 mg/dL — ABNORMAL HIGH (ref 65–99)
Glucose-Capillary: 217 mg/dL — ABNORMAL HIGH (ref 65–99)

## 2016-10-27 LAB — MAGNESIUM: Magnesium: 2.2 mg/dL (ref 1.7–2.4)

## 2016-10-27 LAB — TROPONIN I
Troponin I: 0.04 ng/mL (ref <0.03)
Troponin I: 0.04 ng/mL (ref <0.03)

## 2016-10-27 NOTE — Progress Notes (Signed)
Pharmacy Note  Richard Jimenez is a 76 y.o. male with a h/o thrombosisi of right IJ vein admitted on 10/24/2016 with   pneumonia and acute respiratory failure.  Pharmacy has been consulted for cefazolin and rivaroxaban.   Plan: 1. Continue cefazolin 2 g iv q 8 hours. Bcx: CNS- repeating sensitivites  2. Continue rivaroxaban 20 mg daily.   Height: '5\' 10"'$  (177.8 cm) Weight: 194 lb 14.2 oz (88.4 kg) IBW/kg (Calculated) : 73  Temp (24hrs), Avg:97.8 F (36.6 C), Min:97.5 F (36.4 C), Max:98.2 F (36.8 C)   Recent Labs Lab 10/24/16 1252 10/24/16 1419 10/24/16 1733 10/24/16 2027 10/25/16 0715 10/26/16 0631 10/27/16 0254  WBC  --  6.6  --   --  8.5 9.4 8.4  CREATININE  --  0.90  --   --  0.58* 0.67 0.71  LATICACIDVEN 1.8  --  2.7* 1.9  --   --   --     Estimated Creatinine Clearance: 90.8 mL/min (by C-G formula based on SCr of 0.71 mg/dL).    Allergies  Allergen Reactions  . Clarithromycin Nausea And Vomiting  . Penicillins Nausea And Vomiting, Rash and Other (See Comments)    Has patient had a PCN reaction causing immediate rash, facial/tongue/throat swelling, SOB or lightheadedness with hypotension: No Has patient had a PCN reaction causing severe rash involving mucus membranes or skin necrosis: No Has patient had a PCN reaction that required hospitalization No Has patient had a PCN reaction occurring within the last 10 years: No If all of the above answers are "NO", then may proceed with Cephalosporin use.    Antimicrobials this admission: 1/17 cefepime>> 1/18 1/18 cefazolin >>   Dose adjustments this admission:   Microbiology results: 1/17 Influenza: Negative  1/17 BCx: Staph species 2/2 1/17  MRSA [id number] Thank you for allowing pharmacy to be a part of this patient's care.  Noralee Space, PharmD Clinical Pharmacist 10/27/2016 3:45 PM

## 2016-10-27 NOTE — Progress Notes (Signed)
[facility] Progess Note    ASSESSMENT/PLAN     PULMONARY Name: Richard Jimenez MRN: [id number] DOB: 02-20-1941    ADMISSION DATE:  10/24/2016   SUBJECTIVE:   No significant change over the last 24 hours. Patient remains on noninvasive ventilation with short breaks with high flow nasal oxygen. Poor by mouth intake, states he doesn't like the food.  Review of Systems:  Constitutional: Feels well. Cardiovascular: No chest pain.  Pulmonary: Denies dyspnea.   The remainder of systems were reviewed and were found to be negative other than what is documented in the HPI.    VITAL SIGNS: Temp:  [97.5 F (36.4 C)-97.8 F (36.6 C)] 97.5 F (36.4 C) (01/20 0800) Pulse Rate:  [75-114] 80 (01/20 0700) Resp:  [17-31] 19 (01/20 0700) BP: (83-137)/(64-104) 116/68 (01/20 0700) SpO2:  [84 %-98 %] 96 % (01/20 0700) FiO2 (%):  [40 %-60 %] 40 % (01/20 0800) Weight:  [88.4 kg (194 lb 14.2 oz)] 88.4 kg (194 lb 14.2 oz) (01/20 0459) HEMODYNAMICS:   VENTILATOR SETTINGS: FiO2 (%):  [40 %-60 %] 40 % INTAKE / OUTPUT:  Intake/Output Summary (Last 24 hours) at 10/27/16 0917 Last data filed at 10/27/16 0502  Gross per 24 hour  Intake              200 ml  Output              550 ml  Net             -350 ml    PHYSICAL EXAMINATION: Physical Examination:   VS: BP 116/68   Pulse 80   Temp 97.5 F (36.4 C) (Axillary)   Resp 19   Ht '5\' 10"'$  (1.778 m)   Wt 88.4 kg (194 lb 14.2 oz)   SpO2 96%   BMI 27.96 kg/m   General Appearance: No distress  Neuro:without focal findings, mental status normal. HEENT: PERRLA, EOM intact. Pulmonary: Markedly diminished breath sounds bilaterally with prolonged expiratory phase  CardiovascularNormal S1,S2.  No m/r/g.   Abdomen: Benign, Soft, non-tender. Renal:  No costovertebral tenderness  GU:  Not performed at this time. Endocrine: No evident thyromegaly. Skin:   warm, no rashes, no ecchymosis  Extremities: normal, no cyanosis,  clubbing.   LABS:   LABORATORY PANEL:   CBC  Recent Labs Lab 10/27/16 0254  WBC 8.4  HGB 11.2*  HCT 34.3*  PLT 185    Chemistries   Recent Labs Lab 10/24/16 1419  10/27/16 0254  NA 139  < > 139  K 4.6  < > 4.7  CL 96*  < > 97*  CO2 32  < > 36*  GLUCOSE 161*  < > 149*  BUN 16  < > 26*  CREATININE 0.90  < > 0.71  CALCIUM 9.2  < > 8.7*  MG  --   < > 2.2  PHOS  --   < > 4.3  AST 25  --   --   ALT 13*  --   --   ALKPHOS 56  --   --   BILITOT 0.8  --   --   < > = values in this interval not displayed.   Recent Labs Lab 10/26/16 1235 10/26/16 1553 10/26/16 2013 10/26/16 2339 10/27/16 0310 10/27/16 0749  GLUCAP 143* 132* 191* 156* 141* 139*    Recent Labs Lab 10/26/16 2159  PHART 7.34*  PCO2ART 79*  PO2ART 144*    Recent Labs Lab 10/24/16 1419  AST 25  ALT 13*  ALKPHOS 56  BILITOT 0.8  ALBUMIN 3.9    Cardiac Enzymes  Recent Labs Lab 10/27/16 0254  TROPONINI 0.04*    RADIOLOGY:  Dg Chest 1 View  Result Date: 10/26/2016 CLINICAL DATA:  Dyspnea EXAM: CHEST 1 VIEW COMPARISON:  Yesterday FINDINGS: Normal heart size. Stable asymmetric left apical and right basilar opacity. Left apical opacity is related to treated lung cancer. Right basilar opacity is likely pneumonia. Small pleural effusion on the right which may be loculated. There is a background of chronic lung disease with emphysema by CT. No pneumothorax. IMPRESSION: 1. Stable right basilar opacity favoring pneumonia. Small right pleural effusion. 2. Emphysema and treated left apical lung cancer. Electronically Signed   By: Monte Fantasia M.D.   On: 10/26/2016 06:44   A/P Severe COPD. Patient is presently on albuterol, Atrovent, Pulmicort, Solu-Medrol. Has been requiring predominantly noninvasive ventilation with occasional periods of high flow nasal oxygen. Chest x-ray performed today reveals right basilar opacity with possible small effusion. Is on antibiotic coverage, Ancef. 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 He received chemotherapy and radiation therapy however was stopped prior to completing course secondary to worsening symptoms.  Right jugular vein thrombosis. Patient is presently receiving Xarelto  Gram-positive noted on blood culture staph coag negative, presently on Ancef  Blood sugars have been stable on Solumedrol  We'll continue to try to wean patient off of noninvasive ventilation with giving him more time on high flow nasal oxygen. Patient complaining of the food in the hospital and family will be able to bring in food for him. If able, will have patient out of bed to chair as tolerated    Hermelinda Dellen, DO  Pulmonary and Critical Care Office Number: [phone]  Patricia Pesa, M.D.  Vilinda Boehringer, M.D.  Merton Border, M.D  10/27/2016

## 2016-10-27 NOTE — Progress Notes (Signed)
Pt became sob while eating dinner. Requested bipap back on. WIll give Morphine per discussion with pt and family earlier and try to switch back to hfnc soon

## 2016-10-27 NOTE — Progress Notes (Signed)
Dr Azalee Course at bedside, unable to remove dobhoff. Xray ordered. She will follow up on this tomorrow

## 2016-10-28 ENCOUNTER — Inpatient Hospital Stay: Payer: Medicare Other

## 2016-10-28 LAB — BASIC METABOLIC PANEL
Anion gap: 5 (ref 5–15)
BUN: 22 mg/dL — AB (ref 6–20)
CALCIUM: 8.7 mg/dL — AB (ref 8.9–10.3)
CO2: 39 mmol/L — ABNORMAL HIGH (ref 22–32)
CREATININE: 0.67 mg/dL (ref 0.61–1.24)
Chloride: 94 mmol/L — ABNORMAL LOW (ref 101–111)
GFR calc Af Amer: >60 mL/min (ref >60)
Glucose, Bld: 164 mg/dL — ABNORMAL HIGH (ref 65–99)
Potassium: 4.3 mmol/L (ref 3.5–5.1)
SODIUM: 138 mmol/L (ref 135–145)

## 2016-10-28 LAB — PHOSPHORUS: Phosphorus: 4 mg/dL (ref 2.5–4.6)

## 2016-10-28 LAB — CBC
HCT: 33.9 % — ABNORMAL LOW (ref 40.0–52.0)
Hemoglobin: 11.3 g/dL — ABNORMAL LOW (ref 13.0–18.0)
MCH: 27.7 pg (ref 26.0–34.0)
MCHC: 33.2 g/dL (ref 32.0–36.0)
MCV: 83.2 fL (ref 80.0–100.0)
PLATELETS: 190 10*3/uL (ref 150–440)
RBC: 4.07 MIL/uL — ABNORMAL LOW (ref 4.40–5.90)
RDW: 15.9 % — AB (ref 11.5–14.5)
WBC: 7.7 10*3/uL (ref 3.8–10.6)

## 2016-10-28 LAB — GLUCOSE, CAPILLARY
GLUCOSE-CAPILLARY: 246 mg/dL — AB (ref 65–99)
Glucose-Capillary: 153 mg/dL — ABNORMAL HIGH (ref 65–99)
Glucose-Capillary: 155 mg/dL — ABNORMAL HIGH (ref 65–99)
Glucose-Capillary: 147 mg/dL — ABNORMAL HIGH (ref 65–99)
Glucose-Capillary: 163 mg/dL — ABNORMAL HIGH (ref 65–99)
Glucose-Capillary: 179 mg/dL — ABNORMAL HIGH (ref 65–99)

## 2016-10-28 LAB — MAGNESIUM: MAGNESIUM: 2.2 mg/dL (ref 1.7–2.4)

## 2016-10-28 NOTE — Progress Notes (Addendum)
Initial Nutrition Assessment  DOCUMENTATION CODES:   Not applicable  INTERVENTION:  Snacks  Double protein portions  NUTRITION DIAGNOSIS:   Inadequate oral intake related to acute illness as evidenced by per patient/family report.  GOAL:   Patient will meet greater than or equal to 90% of their needs  MONITOR:   PO intake, Labs, Weight trends  REASON FOR ASSESSMENT:   Malnutrition Screening Tool    ASSESSMENT:   76 y.o. male  with past medical history of small cell lung cancer in 2009 (s/p chemo and radiation) with recurrent extensive small cell lung cancer diagnosed in 2017, CAD, MI, COPD on home O2 at 3L, GERD, HLD, HTN, DM, chronic myelocytic leukemia, jugular vein thrombosis, and current smoker admitted on 10/24/2016 with shortness of breath, wheezing, and cough for 2 weeks. Initially placed on nonrebreather mask in ED and then BiPAP. Chest x-ray reveals RLL pneumonia and emphysema. Patient with acute on chronic respiratory failure secondary to COPD, lung cancer, and pneumonia.   Met with pt in room today. Pt reports good appetite pta and good appetite currently. Pt does not like the hospital food. Pt's family has been bringing food in from home. Pt eating 50-75% meals. Pt does not like supplements but would like to have snacks. Per MD note, Pt to discharge with home hospice and would not want any feeding tubes. Per chart, pt's weights are stable.   Medications reviewed and include: cefazolin, insulin, ativan   Labs reviewed: Cl 94(L), C02 39(H), BUN 22(H), CA 8.7(L), P 4.0 wnl, Mg 2.2 wnl cbgs- 149, 164 x 24 hrs  Nutrition-Focused physical exam completed. Findings are no fat depletion, mild muscle depletion in clavicular region, and mild edema.   Diet Order:  Diet Heart Room service appropriate? Yes; Fluid consistency: Thin  Skin:  Reviewed, no issues  Last BM:  1/19  Height:   Ht Readings from Last 1 Encounters:  10/24/16 _0  (1.778 m)    Weight:   Wt  Readings from Last 1 Encounters:  10/28/16 192 lb 14.4 oz (87.5 kg)    Ideal Body Weight:     BMI:  Body mass index is 27.68 kg/m.  Estimated Nutritional Needs:   Kcal:  2000-2300kcal/day   Protein:  113-130g/day   Fluid:  >2L/day   EDUCATION NEEDS:   No education needs identified at this time  Koleen Distance, RD, LDN Pager #[phone]

## 2016-10-28 NOTE — Progress Notes (Signed)
Pt is alert and oriented, has remained on Bipap for entirety of shift.  NSR with BBB, lungs diminished upon auscultation.  Vital signs stable, afebrile. Adequate urine output, voids in urinal.

## 2016-10-28 NOTE — Progress Notes (Signed)
[facility] Progess Note    ASSESSMENT/PLAN     PULMONARY Name: Richard Jimenez MRN: [id number] DOB: 1940-11-25    ADMISSION DATE:  10/24/2016   SUBJECTIVE:   Patient states his respiratory status has improved. Has been tolerating longer time to off of noninvasive ventilation with high flow nasal oxygen.    VITAL SIGNS: Temp:  [97.5 F (36.4 C)-98.2 F (36.8 C)] 97.5 F (36.4 C) (01/21 0700) Pulse Rate:  [73-113] 74 (01/21 0700) Resp:  [17-31] 21 (01/21 0700) BP: (108-127)/(59-80) 121/71 (01/21 0700) SpO2:  [91 %-100 %] 96 % (01/21 0700) FiO2 (%):  [40 %-45 %] 40 % (01/21 0114) Weight:  [87.5 kg (192 lb 14.4 oz)] 87.5 kg (192 lb 14.4 oz) (01/21 0439) HEMODYNAMICS:   VENTILATOR SETTINGS: FiO2 (%):  [40 %-45 %] 40 % INTAKE / OUTPUT:  Intake/Output Summary (Last 24 hours) at 10/28/16 1010 Last data filed at 10/28/16 0830  Gross per 24 hour  Intake              850 ml  Output             1050 ml  Net             -200 ml    PHYSICAL EXAMINATION: Physical Examination:   VS: BP 121/71   Pulse 74   Temp 97.5 F (36.4 C) (Axillary)   Resp (!) 21   Ht '5\' 10"'$  (1.778 m)   Wt 87.5 kg (192 lb 14.4 oz)   SpO2 96%   BMI 27.68 kg/m   General Appearance: No distress  Neuro:without focal findings, mental status normal. HEENT: PERRLA, EOM intact. Pulmonary: Markedly diminished breath sounds bilaterally with prolonged expiratory phase  CardiovascularNormal S1,S2.  No m/r/g.   Abdomen: Benign, Soft, non-tender. Renal:  No costovertebral tenderness  GU:  Not performed at this time. Endocrine: No evident thyromegaly. Skin:   warm, no rashes, no ecchymosis  Extremities: normal, no cyanosis, clubbing.   LABS:   LABORATORY PANEL:   CBC  Recent Labs Lab 10/28/16 0600  WBC 7.7  HGB 11.3*  HCT 33.9*  PLT 190    Chemistries   Recent Labs Lab 10/24/16 1419  10/28/16 0600  NA 139  < > 138  K 4.6  < > 4.3  CL 96*  < > 94*  CO2 32  <  > 39*  GLUCOSE 161*  < > 164*  BUN 16  < > 22*  CREATININE 0.90  < > 0.67  CALCIUM 9.2  < > 8.7*  MG  --   < > 2.2  PHOS  --   < > 4.0  AST 25  --   --   ALT 13*  --   --   ALKPHOS 56  --   --   BILITOT 0.8  --   --   < > = values in this interval not displayed.   Recent Labs Lab 10/27/16 1115 10/27/16 1652 10/27/16 2025 10/27/16 2305 10/28/16 0434 10/28/16 0739  GLUCAP 133* 217* 139* 156* 153* 155*    Recent Labs Lab 10/26/16 2159  PHART 7.34*  PCO2ART 79*  PO2ART 144*    Recent Labs Lab 10/24/16 1419  AST 25  ALT 13*  ALKPHOS 56  BILITOT 0.8  ALBUMIN 3.9    Cardiac Enzymes  Recent Labs Lab 10/27/16 0933  TROPONINI 0.04*    RADIOLOGY:  Dg Chest Port 1 View  Result Date: 10/28/2016 CLINICAL DATA:  Acute  respiratory failure EXAM: PORTABLE CHEST 1 VIEW COMPARISON:  October 26, 2016 FINDINGS: Pleuroparenchymal thickening in the left apex is stable. No pneumothorax. The cardiomediastinal silhouette is stable. Probable layering effusion on the right. Opacity in the right base mildly worsened in the interval. No other interval change. IMPRESSION: 1. Probable right layering effusion with underlying opacity, more prominent in the interval. 2. Possible mild edema.  No other change. Electronically Signed   By: Dorise Bullion III M.D   On: 10/28/2016 08:26   A/P Severe COPD. Patient is presently on albuterol, Atrovent, Pulmicort, Solu-Medrol. Has been requiring predominantly noninvasive ventilation with occasional periods of high flow nasal oxygen. Chest x-ray performed today reveals right basilar opacity with possible small effusion. Is on antibiotic coverage, Ancef. 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 He received chemotherapy and radiation therapy however was stopped prior to  completing course secondary to worsening symptoms.  Right jugular vein thrombosis. Patient is presently receiving Xarelto  Gram-positive noted on blood culture staph coag negative, presently on Ancef  Blood sugars have been stable on Solumedrol  We'll continue to try to wean patient off of noninvasive ventilation with giving him more time on high flow nasal oxygen. Patient complaining of the food in the hospital and family will be able to bring in food for him. If able, will have patient out of bed to chair as tolerated    Hermelinda Dellen, DO [facility] Office Number: [phone]  Patricia Pesa, M.D.  Vilinda Boehringer, M.D.  Merton Border, M.D  10/28/2016

## 2016-10-29 DIAGNOSIS — Z66 Do not resuscitate: Secondary | ICD-10-CM

## 2016-10-29 LAB — GLUCOSE, CAPILLARY
GLUCOSE-CAPILLARY: 151 mg/dL — AB (ref 65–99)
GLUCOSE-CAPILLARY: 158 mg/dL — AB (ref 65–99)
GLUCOSE-CAPILLARY: 163 mg/dL — AB (ref 65–99)
GLUCOSE-CAPILLARY: 205 mg/dL — AB (ref 65–99)
Glucose-Capillary: 173 mg/dL — ABNORMAL HIGH (ref 65–99)
Glucose-Capillary: 301 mg/dL — ABNORMAL HIGH (ref 65–99)

## 2016-10-29 MED ORDER — IPRATROPIUM-ALBUTEROL 0.5-2.5 (3) MG/3ML IN SOLN
3.0000 mL | Freq: Once | RESPIRATORY_TRACT | Status: AC
  Administered 2016-10-29: 3 mL via RESPIRATORY_TRACT

## 2016-10-29 MED ORDER — IPRATROPIUM-ALBUTEROL 0.5-2.5 (3) MG/3ML IN SOLN
RESPIRATORY_TRACT | Status: AC
  Administered 2016-10-29: 3 mL via RESPIRATORY_TRACT
  Filled 2016-10-29: qty 3

## 2016-10-29 NOTE — Progress Notes (Signed)
Per Dr. Mortimer Fries change patient level of care to med surg no telemetry.  Richard Jimenez

## 2016-10-29 NOTE — Care Management (Signed)
Met with patient to follow up on hospice list of agencies left with him on Friday. He states he has not been able to talk to family. Patient asked that I contact his daughter Richard Jimenez to discuss. I have left message for Richard Jimenez to call this RNCM.

## 2016-10-29 NOTE — Progress Notes (Signed)
[facility] Progess Note      PULMONARY Name: Richard Jimenez MRN: [id number] DOB: 10-11-1940    ADMISSION DATE:  10/24/2016   SUBJECTIVE:   Patient states his respiratory status has improved. Has been tolerating longer time to off of noninvasive ventilation with high flow nasal oxygen.  Placed on biPAP last night   VITAL SIGNS: Temp:  [97.4 F (36.3 C)-98.2 F (36.8 C)] 98 F (36.7 C) (01/22 0500) Pulse Rate:  [89-111] 104 (01/21 1800) Resp:  [20-32] 29 (01/21 1800) BP: (106-130)/(59-79) 106/59 (01/21 1800) SpO2:  [87 %-95 %] 94 % (01/22 0452) FiO2 (%):  [32 %-40 %] 32 % (01/22 0721) Weight:  [183 lb 13.8 oz (83.4 kg)] 183 lb 13.8 oz (83.4 kg) (01/22 0442) HEMODYNAMICS:   VENTILATOR SETTINGS: FiO2 (%):  [32 %-40 %] 32 % INTAKE / OUTPUT:  Intake/Output Summary (Last 24 hours) at 10/29/16 0814 Last data filed at 10/29/16 0000  Gross per 24 hour  Intake              940 ml  Output              725 ml  Net              215 ml    PHYSICAL EXAMINATION: Physical Examination:   VS: BP (!) 106/59   Pulse (!) 104   Temp 98 F (36.7 C) (Oral)   Resp (!) 29   Ht '5\' 10"'$  (1.778 m)   Wt 183 lb 13.8 oz (83.4 kg)   SpO2 94%   BMI 26.38 kg/m   General Appearance: No distress  Neuro:without focal findings, mental status normal. HEENT: PERRLA, EOM intact. Pulmonary: Markedly diminished breath sounds bilaterally with prolonged expiratory phase  CardiovascularNormal S1,S2.  No m/r/g.   Skin:   warm, no rashes, no ecchymosis  Extremities: normal, no cyanosis, clubbing.   LABS:   LABORATORY PANEL:   CBC  Recent Labs Lab 10/28/16 0600  WBC 7.7  HGB 11.3*  HCT 33.9*  PLT 190    Chemistries   Recent Labs Lab 10/24/16 1419  10/28/16 0600  NA 139  < > 138  K 4.6  < > 4.3  CL 96*  < > 94*  CO2 32  < > 39*  GLUCOSE 161*  < > 164*  BUN 16  < > 22*  CREATININE 0.90  < > 0.67  CALCIUM 9.2  < > 8.7*  MG  --   < > 2.2  PHOS  --   <  > 4.0  AST 25  --   --   ALT 13*  --   --   ALKPHOS 56  --   --   BILITOT 0.8  --   --   < > = values in this interval not displayed.   Recent Labs Lab 10/28/16 1157 10/28/16 1609 10/28/16 1933 10/28/16 2337 10/29/16 0341 10/29/16 0734  GLUCAP 147* 163* 179* 246* 163* 205*    Recent Labs Lab 10/26/16 2159  PHART 7.34*  PCO2ART 79*  PO2ART 144*    Recent Labs Lab 10/24/16 1419  AST 25  ALT 13*  ALKPHOS 56  BILITOT 0.8  ALBUMIN 3.9    Cardiac Enzymes  Recent Labs Lab 10/27/16 0933  TROPONINI 0.04*    RADIOLOGY:  Dg Chest Port 1 View  Result Date: 10/28/2016 CLINICAL DATA:  Acute respiratory failure EXAM: PORTABLE CHEST 1 VIEW COMPARISON:  October 26, 2016 FINDINGS: Pleuroparenchymal thickening  in the left apex is stable. No pneumothorax. The cardiomediastinal silhouette is stable. Probable layering effusion on the right. Opacity in the right base mildly worsened in the interval. No other interval change. IMPRESSION: 1. Probable right layering effusion with underlying opacity, more prominent in the interval. 2. Possible mild edema.  No other change. Electronically Signed   By: Dorise Bullion III M.D   On: 10/28/2016 08:26   A/P Severe COPD from pneumonia  -Patient is presently on albuterol, Atrovent, Pulmicort, Solu-Medrol. Has been requiring predominantly noninvasive ventilation with occasional periods of high flow nasal oxygen. Chest x-ray performed today reveals right basilar opacity with possible small effusion. Is on antibiotic coverage, Ancef. 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 He received chemotherapy and radiation therapy however was stopped prior to completing course secondary to worsening symptoms.  Right jugular vein thrombosis. Patient is presently receiving  Xarelto  Gram-positive noted on blood culture staph coag negative, presently on Ancef  Blood sugars have been stable on Solumedrol  We'll continue to try to wean patient off of noninvasive ventilation with giving him more time on high flow nasal oxygen. Patient complaining of the food in the hospital and family will be able to bring in food for him. If able, will have patient out of bed to chair as tolerated  I have personally obtained a history, examined the patient, evaluated Pertinent laboratory and RadioGraphic/imaging results, and  formulated the assessment and plan   The Patient requires high complexity decision making for assessment and support, frequent evaluation and titration of therapies.  Continue SD status and assess for transfer to gen med floor in 24-48 hrs  Primo Innis Patricia Pesa, M.D.  [facility]  Medical Director [place] Director [facility] Cardio-Pulmonary Department

## 2016-10-29 NOTE — Progress Notes (Signed)
Inpatient Diabetes Program Recommendations  AACE/ADA: New Consensus Statement on Inpatient Glycemic Control (2015)  Target Ranges:  Prepandial:   less than 140 mg/dL      Peak postprandial:   less than 180 mg/dL (1-2 hours)      Critically ill patients:  140 - 180 mg/dL   Results for BAYARD, MORE (MRN [id number]) as of 10/29/2016 08:32  Ref. Range 10/28/2016 07:39 10/28/2016 11:57 10/28/2016 16:09 10/28/2016 19:33 10/28/2016 23:37 10/29/2016 03:41 10/29/2016 07:34  Glucose-Capillary Latest Ref Range: 65 - 99 mg/dL 155 (H) 147 (H) 163 (H) 179 (H) 246 (H) 163 (H) 205 (H)   Review of Glycemic Control  Diabetes history: DM2 Outpatient Diabetes medications: Metformin 1000 mg BID Current orders for Inpatient glycemic control: Novolog 0-9 units Q4H  Inpatient Diabetes Program Recommendations: Insulin - Basal: If steroids are continued, please consider ordering Lantus 8 units Q24H starting now (based on 83 kg x 0.1 units).  Thanks, Barnie Alderman, RN, MSN, CDE Diabetes Coordinator Inpatient Diabetes Program [phone] (Team Pager from 8am to 5pm)

## 2016-10-29 NOTE — Progress Notes (Signed)
Pt resting quietly on [place] '@3L'$  tolerating well. Pt had one episode throughout shift that he became dyspneic on exertion with urinating.pt was placed on bipap for 30 mins and received resp. treatment once settled pt. Transitioned back to [place] tolerating well.

## 2016-10-29 NOTE — Progress Notes (Signed)
Pharmacy Note  Richard Jimenez is a 76 y.o. male with a h/o thrombosisi of right IJ vein admitted on 10/24/2016 with   pneumonia and acute respiratory failure.  Pharmacy has been consulted for cefazolin and rivaroxaban.   Plan: 1. Continue cefazolin 2 g iv q 8 hours. Bcx: CNS- repeating sensitivites Sensitivities sent to [facility] on 1/21. Per [facility] the sensitivities will result on 1/25.   2. Continue rivaroxaban 20 mg daily.   Height: '5\' 10"'$  (177.8 cm) Weight: 183 lb 13.8 oz (83.4 kg) IBW/kg (Calculated) : 73  Temp (24hrs), Avg:97.8 F (36.6 C), Min:97.4 F (36.3 C), Max:98.2 F (36.8 C)   Recent Labs Lab 10/24/16 1252 10/24/16 1419 10/24/16 1733 10/24/16 2027 10/25/16 0715 10/26/16 0631 10/27/16 0254 10/28/16 0600  WBC  --  6.6  --   --  8.5 9.4 8.4 7.7  CREATININE  --  0.90  --   --  0.58* 0.67 0.71 0.67  LATICACIDVEN 1.8  --  2.7* 1.9  --   --   --   --     Estimated Creatinine Clearance: 83.6 mL/min (by C-G formula based on SCr of 0.67 mg/dL).    Allergies  Allergen Reactions  . Clarithromycin Nausea And Vomiting  . Penicillins Nausea And Vomiting, Rash and Other (See Comments)    Has patient had a PCN reaction causing immediate rash, facial/tongue/throat swelling, SOB or lightheadedness with hypotension: No Has patient had a PCN reaction causing severe rash involving mucus membranes or skin necrosis: No Has patient had a PCN reaction that required hospitalization No Has patient had a PCN reaction occurring within the last 10 years: No If all of the above answers are "NO", then may proceed with Cephalosporin use.    Antimicrobials this admission: 1/17 cefepime>> 1/18 1/18 cefazolin >>   Microbiology results: 1/17 Influenza: Negative  1/17 BCx: Staph species 2/2 1/17  MRSA [id number]  Thank you for allowing pharmacy to be a part of this patient's care.  Loree Fee, PharmD 10/29/2016 12:36 PM

## 2016-10-30 LAB — GLUCOSE, CAPILLARY
GLUCOSE-CAPILLARY: 107 mg/dL — AB (ref 65–99)
Glucose-Capillary: 159 mg/dL — ABNORMAL HIGH (ref 65–99)
Glucose-Capillary: 207 mg/dL — ABNORMAL HIGH (ref 65–99)
Glucose-Capillary: 160 mg/dL — ABNORMAL HIGH (ref 65–99)
Glucose-Capillary: 269 mg/dL — ABNORMAL HIGH (ref 65–99)

## 2016-10-30 MED ORDER — BUDESONIDE 0.5 MG/2ML IN SUSP
0.5000 mg | Freq: Two times a day (BID) | RESPIRATORY_TRACT | Status: DC
  Administered 2016-10-30 – 2016-11-01 (×4): 0.5 mg via RESPIRATORY_TRACT
  Filled 2016-10-30 (×4): qty 2

## 2016-10-30 MED ORDER — INSULIN GLARGINE 100 UNIT/ML ~~LOC~~ SOLN
8.0000 [IU] | Freq: Every day | SUBCUTANEOUS | Status: DC
  Administered 2016-10-30 – 2016-10-31 (×3): 8 [IU] via SUBCUTANEOUS
  Filled 2016-10-30 (×4): qty 0.08

## 2016-10-30 MED ORDER — INSULIN DETEMIR 100 UNIT/ML ~~LOC~~ SOLN: 8.0000 [IU] | Freq: Every day | SUBCUTANEOUS | Status: DC

## 2016-10-30 MED ORDER — CEFAZOLIN SODIUM-DEXTROSE 2-3 GM-% IV SOLR
2.0000 g | Freq: Three times a day (TID) | INTRAVENOUS | Status: DC
  Filled 2016-10-30: qty 50

## 2016-10-30 MED ORDER — IPRATROPIUM-ALBUTEROL 0.5-2.5 (3) MG/3ML IN SOLN
3.0000 mL | RESPIRATORY_TRACT | Status: DC
  Administered 2016-10-30 – 2016-10-31 (×5): 3 mL via RESPIRATORY_TRACT
  Filled 2016-10-30 (×4): qty 3

## 2016-10-30 NOTE — Progress Notes (Signed)
Pt is refusing bipap tonight

## 2016-10-30 NOTE — Progress Notes (Signed)
[facility] Progess Note      PULMONARY Name: Richard Jimenez MRN: [id number] DOB: [birth year]    ADMISSION DATE:  10/24/2016   SUBJECTIVE:   Patient states his respiratory status has improved. On [place] oxygen.  Slight increased WOB +wheezing   VITAL SIGNS: Temp:  [97.8 F (36.6 C)-98.2 F (36.8 C)] 97.8 F (36.6 C) (01/23 0412) Pulse Rate:  [76-110] 76 (01/23 0412) Resp:  [20-30] 20 (01/23 0414) BP: (109-133)/(56-84) 109/56 (01/23 0412) SpO2:  [90 %-98 %] 94 % (01/23 0412) Weight:  [178 lb 3.2 oz (80.8 kg)] 178 lb 3.2 oz (80.8 kg) (01/23 0412) HEMODYNAMICS:   VENTILATOR SETTINGS:   INTAKE / OUTPUT:  Intake/Output Summary (Last 24 hours) at 10/30/16 1229 Last data filed at 10/30/16 0900  Gross per 24 hour  Intake              440 ml  Output                0 ml  Net              440 ml    PHYSICAL EXAMINATION: Physical Examination:   VS: BP (!) 109/56 (BP Location: Right Arm)   Pulse 76   Temp 97.8 F (36.6 C) (Oral)   Resp 20   Ht '5\' 9"'$  (1.753 m)   Wt 178 lb 3.2 oz (80.8 kg)   SpO2 94%   BMI 26.32 kg/m   General Appearance: No distress  Neuro:without focal findings, mental status normal. HEENT: PERRLA, EOM intact. Pulmonary: Markedly diminished breath sounds bilaterally with prolonged expiratory phase  CardiovascularNormal S1,S2.  No m/r/g.   Skin:   warm, no rashes, no ecchymosis  Extremities: normal, no cyanosis, clubbing.   LABS:   LABORATORY PANEL:   CBC  Recent Labs Lab 10/28/16 0600  WBC 7.7  HGB 11.3*  HCT 33.9*  PLT 190    Chemistries   Recent Labs Lab 10/24/16 1419  10/28/16 0600  NA 139  < > 138  K 4.6  < > 4.3  CL 96*  < > 94*  CO2 32  < > 39*  GLUCOSE 161*  < > 164*  BUN 16  < > 22*  CREATININE 0.90  < > 0.67  CALCIUM 9.2  < > 8.7*  MG  --   < > 2.2  PHOS  --   < > 4.0  AST 25  --   --   ALT 13*  --   --   ALKPHOS 56  --   --   BILITOT 0.8  --   --   < > = values in this interval not  displayed.   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  He received chemotherapy and radiation therapy however was stopped prior to completing course secondary to worsening symptoms.  Right jugular vein thrombosis. Patient is presently receiving Xarelto  Gram-positive noted on blood culture staph coag negative, presently on Ancef  Blood sugars have been stable on Solumedrol palliative care team following, patient is DNR/DNI   I have personally obtained a history, examined the patient, evaluated Pertinent laboratory and RadioGraphic/imaging results, and  formulated the assessment and plan   The Patient requires high complexity decision making for assessment and support, frequent evaluation and titration of therapies.  Plan for d/c home with hospice in 24-48 hrs  Rhone Ozaki Patricia Pesa, M.D.  [facility]  Medical Director [place] Director [facility] Cardio-Pulmonary Department

## 2016-10-30 NOTE — Progress Notes (Signed)
Pt. Placed back on BIpap at this time. Pt. Encouraged to keep on. Tolerating well at this time will continue to monitor.

## 2016-10-30 NOTE — Progress Notes (Addendum)
New referral for hospice at home upon discharge from [facility] on 76 year old gentleman who was admitted to [facility] on 1.17.18 with increased shortness of breath in the setting of small cell lung cancer which required BiPap.  Patient was dx with RLL pneumonia.  Past medical history of small cell lung cancer, COPD (home O2 @ 3L), MI, CAD, HLD, DM, chronic myelocytic leukemia, jugular vein thrombosis and current smoker.  Patient still requiring intermittent BiPap and high flow oxygen at 60%.  Patient is post palliative medicine consult.  Patient continues to be reluctant to hospice services, but family is requesting a family meeting to discuss hospice services further. Family at this time is requesting hospice services at discharge, but patient is more reluctant.  Spoke to patient's daughter Abigail Butts via telephone.   Meeting set for tomorrow 1.24.18 at 1300.  Updated information faxed to referral intake.  Will continue to follow.  Dimas Aguas, RN Clinical Nurse Liaison [facility] [phone]

## 2016-10-30 NOTE — Progress Notes (Signed)
Bipap remains at bedside, pt did not want to wear. No signs of resp distress noted.

## 2016-10-30 NOTE — Progress Notes (Addendum)
Daily Progress Note   Patient Name: Richard Jimenez       Date: 10/30/2016 DOB: 11-22-1940  Age: 76 y.o. MRN#: [id number] Attending Physician: Vaughan Basta, MD Primary Care Physician: Madelyn Brunner, MD Admit Date: 10/24/2016  Reason for Consultation/Follow-up: Establishing goals of care  Subjective: 0930-Patient alert, oriented and denies pain or discomfort this morning. Breathing comfortably on 3L [place]. I sat patient up to eat breakfast. Daughter at bedside. Patient tells me " I don't need hospice yet" because he does not need help with bathing. Explained my concern with need for hospice for symptom management and concern for continued decline as disease progresses (COPD and cancer) requiring frequent re-hospitalization. Discussed the focus on comfort and symptom management control knowing he has a terminal and progressive disease. He tells me his wife worked with hospice for many years and they know how to reach out to them when ready. Encouraged palliative to follow outpatient to help transition to hospice when they are ready. He agrees with this.   1050-RN CM has spoken with daughter Abigail Butts and family requesting [facility] hospice services at home.   Length of Stay: 6  Current Medications: Scheduled Meds:  . budesonide (PULMICORT) nebulizer solution  0.25 mg Nebulization BID  .  ceFAZolin (ANCEF) IV  2 g Intravenous Q8H  . insulin aspart  0-9 Units Subcutaneous Q4H  . insulin glargine  8 Units Subcutaneous QHS  . ipratropium-albuterol  3 mL Nebulization Q6H  . mouth rinse  15 mL Mouth Rinse BID  . methylPREDNISolone (SOLU-MEDROL) injection  40 mg Intravenous Q12H  . rivaroxaban  20 mg Oral Q supper    Continuous Infusions:  PRN Meds: sodium chloride, acetaminophen,  LORazepam, morphine CONCENTRATE  Physical Exam  Constitutional: He is oriented to person, place, and time. He is cooperative.  HENT:  Head: Normocephalic and atraumatic.  Cardiovascular: Regular rhythm.   Pulmonary/Chest: No accessory muscle usage. No tachypnea. He has decreased breath sounds. He has wheezes.  Abdominal: Soft.  Neurological: He is alert and oriented to person, place, and time.  Skin: Skin is warm and dry. There is pallor.  Psychiatric: He has a normal mood and affect. His speech is normal and behavior is normal. Cognition and memory are normal.  Nursing note and vitals reviewed.  Vital Signs: BP (!) 109/56 (BP Location: Right Arm)   Pulse 76   Temp 97.8 F (36.6 C) (Oral)   Resp 20   Ht '5\' 9"'$  (1.753 m)   Wt 80.8 kg (178 lb 3.2 oz)   SpO2 94%   BMI 26.32 kg/m  SpO2: SpO2: 94 % O2 Device: O2 Device: Nasal Cannula O2 Flow Rate: O2 Flow Rate (L/min): 3 L/min  Intake/output summary:   Intake/Output Summary (Last 24 hours) at 10/30/16 1048 Last data filed at 10/30/16 0900  Gross per 24 hour  Intake              440 ml  Output                0 ml  Net              440 ml   LBM: Last BM Date: 10/26/16 (per pt report) Baseline Weight: Weight: 89.4 kg (197 lb) Most recent weight: Weight: 80.8 kg (178 lb 3.2 oz)       Palliative Assessment/Data: PPS 40%   Flowsheet Rows   Flowsheet Row Most Recent Value  Intake Tab  Referral Department  Critical care  Unit at Time of Referral  ICU  Palliative Care Primary Diagnosis  Pulmonary  Date Notified  10/24/16  Palliative Care Type  New Palliative care  Reason for referral  Clarify Goals of Care, Counsel Regarding Hospice  Date of Admission  10/24/16  Date first seen by Palliative Care  10/26/16  # of days Palliative referral response time  2 Day(s)  # of days IP prior to Palliative referral  0  Clinical Assessment  Palliative Performance Scale Score  40%  Psychosocial & Spiritual Assessment    Palliative Care Outcomes  Patient/Family meeting held?  Yes  Who was at the meeting?  patient and daughter  Palliative Care Outcomes  Clarified goals of care, Counseled regarding hospice, Provided end of life care assistance, Provided psychosocial or spiritual support, Transitioned to hospice      Patient Active Problem List   Diagnosis Date Noted  . Acute respiratory failure with hypoxia and hypercapnia (HCC)   . Healthcare-associated pneumonia   . Palliative care by specialist   . DNR (do not resuscitate)   . Encounter for hospice care discussion   . Elevated troponin I level   . Hypoxia   . Dyspnea   . Acute respiratory failure ([place]) 05/18/2016  . NSTEMI (non-ST elevated myocardial infarction) ([place]) 05/18/2016  . Acute DVT (deep venous thrombosis) ([place]) 04/25/2016  . Primary malignant neoplasm of left upper lobe of lung ([place]) 04/18/2016  . DVT of axillary vein, acute right ([place]) 04/18/2016  . Frequent headaches 04/18/2016  . DVT (deep venous thrombosis), right 04/18/2016  . Jugular vein thrombosis, right   . Internal jugular vein thrombosis ([place]) 04/06/2016  . Neck pain 04/06/2016  . Hyponatremia 04/06/2016  . Anemia 04/06/2016  . Dehydration 04/04/2016  . Dysphagia 04/04/2016  . Small cell lung cancer ([place]) 02/06/2016  . Type 2 diabetes mellitus ([place]) 07/08/2015  . Lung cancer, upper lobe ([place]) 03/29/2015  . COPD exacerbation ([place]) 04/21/2012  . Breath shortness 04/21/2012  . H/O malignant neoplasm 04/21/2012  . H/O acute myocardial infarction 04/21/2012  . Arthritis, degenerative 04/21/2012  . BP (high blood pressure) 04/21/2012  . 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 male  with past medical history of small cell lung cancer  in 2009 (s/p chemo and radiation) with recurrent extensive small cell lung cancer diagnosed in 2017, CAD, MI, COPD on home O2 at 3L, GERD, HLD, HTN, DM, chronic myelocytic  leukemia, jugular vein thrombosis, and current smoker admitted on 10/24/2016 with shortness of breath, wheezing, and cough for 2 weeks. Initially placed on nonrebreather mask in ED and then BiPAP. Chest x-ray reveals RLL pneumonia and emphysema. Patient with acute on chronic respiratory failure secondary to COPD, lung cancer, and pneumonia. Receiving scheduled bronchodilators and IV steroids. Also receiving Ancef. Blood cultures gram-positive cocci. He has required intermittent BiPAP and high flow oxygen at 60%. Followed by Dr. Eual Fines family, the patient finished chemotherapy and was receiving palliative radiation which he has recently stopped due to worsening symptoms. Palliative medicine consultation for goals of care.  Assessment: Severe COPD Stage IIB squamous cell lung cancer Acute on chronic respiratory failure Pneumonia Right jugular vein thrombosis  Recommendations/Plan:   Daughter Abigail Butts) has spoke with RN CM and family agreeable to hospice at home.   Possible d/c today.   Code Status: DNR/DNI   Code Status Orders        Start     Ordered   10/26/16 4259  Do not attempt resuscitation (DNR)  Continuous    Question Answer Comment  In the event of cardiac or respiratory ARREST Do not call a "code blue"   In the event of cardiac or respiratory ARREST Do not perform Intubation, CPR, defibrillation or ACLS   In the event of cardiac or respiratory ARREST Use medication by any route, position, wound care, and other measures to relive pain and suffering. May use oxygen, suction and manual treatment of airway obstruction as needed for comfort.      10/26/16 1316    Code Status History    Date Active Date Inactive Code Status Order ID Comments User Context   10/24/2016  3:36 PM 10/26/2016  1:16 PM Partial Code [id number]  Awilda Bill, NP ED   05/18/2016  7:07 AM 05/18/2016  3:57 PM DNR [id number]  Harrie Foreman, MD ED   05/18/2016  4:12 AM 05/18/2016  7:07 AM DNR [id number]   Earleen Newport, MD ED   04/18/2016  5:35 PM 04/20/2016  2:33 PM Partial Code [id number]  Demetrios Loll, MD Inpatient   04/18/2016  5:23 PM 04/18/2016  5:35 PM Full Code [id number]  Demetrios Loll, MD Inpatient   04/06/2016  9:33 PM 04/09/2016  5:34 PM Full Code [id number]  Theodoro Grist, MD Inpatient    Advance Directive Documentation   Flowsheet Row Most Recent Value  Type of Advance Directive  Out of facility DNR (pink MOST or yellow form)  Pre-existing out of facility DNR order (yellow form or pink MOST form)  No data  "MOST" Form in Place?  No data       Prognosis:   < 6 months  Discharge Planning:  To Be Determined home with home health with palliative versus home with hospice   Care plan was discussed with patient, daughter, RN, RN CM, and Dr. Anselm Jungling  Thank you for allowing the Palliative Medicine Team to assist in the care of this patient.   Time In: 0900 Time Out: 0935 Total Time 77mn Prolonged Time Billed  no       Greater than 50%  of this time was spent counseling and coordinating care related to the above assessment and plan.  MIhor Dow FNP-C Palliative Medicine Team  Phone: [phone]: [phone] Please contact  Palliative Medicine Team phone at [phone] for questions and concerns.

## 2016-10-30 NOTE — Evaluation (Signed)
Physical Therapy Evaluation Patient Details Name: Richard Jimenez MRN: [id number] DOB: 10/25/1940 Today's Date: 10/30/2016   History of Present Illness  Pt admitted w/ acute respiratory failure, HAP and Jugular vein thrombosis (on Xarelto). He has an extensive medical history of; CAD, COPD, GERD, CML, MI, HTN, HLD, and OA  Clinical Impression  Pt awake, alert and oriented throughout session. Pt O2 was 93% at rest in supine w/ 3L/min. Strength grossly WFLs and at least 4-/5. Pt able to perform bed mobility to sit at EOB but displayed increase in work of breathing and fatigue once sitting. O2 was 91% in sitting on 3L/min. He required frequent resting breaks due to fatigue and requested increase in O2 flow; nurse consulted and O2 was increased to 4L/min. Pt attempted to stand but stated increase in pain in anterior thighs bilaterally; stated he never had this type of pain previously. Height of bed was raised and patient able to stand w/ RW w/ mod assist from PT . He only tolerated standing for 30 seconds and requested to be put back into bed. O2 sat dropped to 84% once back in bed but returned to 92% on 4L/min. Pt requested breathing treatments and nursing staff was notified. Any physical activity currently increases the pt's work of breathing and fatigue. Current cardiopulmonary status severely limits safe functional mobility and prevents safe discharge home. Pt will benefit from skilled PT to improve activity tolerance and functional mobility.     Follow Up Recommendations SNF    Equipment Recommendations  Rolling Cazarez with 5" wheels    Recommendations for Other Services       Precautions / Restrictions Precautions Precautions: Fall      Mobility  Bed Mobility Overal bed mobility: Modified Independent             General bed mobility comments: Use of bed rail to obtain sitting posture; SOB w/ exertion   Transfers Overall transfer level: Needs assistance Equipment used: Rolling  Stipes (2 wheeled) Transfers: Sit to/from Stand Sit to Stand: Mod assist         General transfer comment: Pain w/ LE muscle activation, required long sitting break before transfers, cuing for proper body mechanics  Ambulation/Gait             General Gait Details: Not able to attempt due to fatigue   Stairs            Wheelchair Mobility    Modified Rankin (Stroke Patients Only)       Balance Overall balance assessment: Needs assistance Sitting-balance support: Bilateral upper extremity supported;Feet supported Sitting balance-Leahy Scale: Fair Sitting balance - Comments: Pt leans forward on RW in sitting, increased work of breathing   Standing balance support: Bilateral upper extremity supported Standing balance-Leahy Scale: Poor Standing balance comment: poor activity tolerance, able to obtain upright posture for less than 30 seconds                              Pertinent Vitals/Pain Pain Assessment: Faces Faces Pain Scale: Hurts whole lot Pain Location: Bilat LE anterior thighs Pain Descriptors / Indicators: Cramping Pain Intervention(s): Limited activity within patient's tolerance;Repositioned;Monitored during session    Home Living Family/patient expects to be discharged to:: Private residence Living Arrangements: Spouse/significant other Available Help at Discharge: Family Type of Home: House Home Access: Stairs to enter   Technical brewer of Steps: 4 Home Layout: One level Home Equipment: None  Prior Function Level of Independence: Independent               Hand Dominance        Extremity/Trunk Assessment   Upper Extremity Assessment Upper Extremity Assessment: Overall WFL for tasks assessed (grossly at least 4/5)    Lower Extremity Assessment Lower Extremity Assessment: Overall WFL for tasks assessed (grossly at least 4-/5)    Cervical / Trunk Assessment Cervical / Trunk Assessment: Kyphotic   Communication   Communication: No difficulties  Cognition Arousal/Alertness: Awake/alert Behavior During Therapy: WFL for tasks assessed/performed Overall Cognitive Status: Within Functional Limits for tasks assessed                      General Comments      Exercises Other Exercises Other Exercises: sitting therex, AROM: 1x12 for increasing LE strength for functional activities; heel rises, marching. Increase in fatigue w/ exercise    Assessment/Plan    PT Assessment Patient needs continued PT services  PT Problem List Decreased strength;Decreased range of motion;Decreased activity tolerance;Decreased balance;Decreased knowledge of use of DME;Decreased mobility;Cardiopulmonary status limiting activity          PT Treatment Interventions DME instruction;Gait training;Stair training;Functional mobility training;Balance training;Therapeutic exercise;Therapeutic activities;Patient/family education    PT Goals (Current goals can be found in the Care Plan section)  Acute Rehab PT Goals Patient Stated Goal: return home PT Goal Formulation: With patient Time For Goal Achievement: 11/13/16 Potential to Achieve Goals: Poor    Frequency Min 2X/week   Barriers to discharge Inaccessible home environment;Decreased caregiver support      Co-evaluation               End of Session Equipment Utilized During Treatment: Gait belt;Oxygen Activity Tolerance: Patient limited by fatigue Patient left: in bed;with bed alarm set;with nursing/sitter in room;with call bell/phone within reach Nurse Communication: Patient requests pain meds;Mobility status         Time: 0034-9611 PT Time Calculation (min) (ACUTE ONLY): 33 min   Charges:         PT G Codes:        Richard Jimenez 10/30/2016, 2:20 PM

## 2016-10-30 NOTE — Progress Notes (Signed)
Pt transferred to rm 105. Pt transferred with nasal cannula in place at 4L tolerating well.. New IV started to  Right wrist. Pt sats remained . 92 all shift no s/s of distress noted prior to transfer.

## 2016-10-30 NOTE — Care Management (Signed)
This RNCM received callback from patient's daughter Abigail Butts [phone]. She thought patient was still in ICU. I have updated her on room number. She has multiple questions regarding home with hospice and services. They have decided that they would like to use [facility]. I have updated Gilles Chiquito and Santiago Glad RN with [facility] hosp[facility].

## 2016-10-30 NOTE — Care Management Important Message (Signed)
Important Message  Patient Details  Name: QUINTAN SALDIVAR MRN: [id number] Date of Birth: 1941/05/29   Medicare Important Message Given:  Yes    Shelbie Ammons, RN 10/30/2016, 8:28 AM

## 2016-10-30 NOTE — Progress Notes (Signed)
[facility] [place] NAME: Richard Jimenez    MR#:  [id number]  DATE OF BIRTH:  07-07-1941  SUBJECTIVE:  CHIEF COMPLAINT:   Chief Complaint  Patient presents with  . Shortness of Breath     Admitted with respiratory failure and pneumonia, was in ICU with BiPAP support for last few days - transferred to medical floor in hospitalist service today. He is on 3 L oxygen at home at his baseline, currently requiring 4 L of oxygen and feels slightly short of breath but much better than before. He was advised to use BiPAP machine at nighttime here but he is refusing to use that so last night he did not use it.   REVIEW OF SYSTEMS:  CONSTITUTIONAL: No fever, fatigue or weakness.  EYES: No blurred or double vision.  EARS, NOSE, AND THROAT: No tinnitus or ear pain.  RESPIRATORY: No cough,Some  shortness of breath, wheezing , no hemoptysis.  CARDIOVASCULAR: No chest pain, orthopnea, edema.  GASTROINTESTINAL: No nausea, vomiting, diarrhea or abdominal pain.  GENITOURINARY: No dysuria, hematuria.  ENDOCRINE: No polyuria, nocturia,  HEMATOLOGY: No anemia, easy bruising or bleeding SKIN: No rash or lesion. MUSCULOSKELETAL: No joint pain or arthritis.   NEUROLOGIC: No tingling, numbness, weakness.  PSYCHIATRY: No anxiety or depression.   ROS  DRUG ALLERGIES:   Allergies  Allergen Reactions  . Clarithromycin Nausea And Vomiting  . Penicillins Nausea And Vomiting, Rash and Other (See Comments)    Has patient had a PCN reaction causing immediate rash, facial/tongue/throat swelling, SOB or lightheadedness with hypotension: No Has patient had a PCN reaction causing severe rash involving mucus membranes or skin necrosis: No Has patient had a PCN reaction that required hospitalization No Has patient had a PCN reaction occurring within the last 10 years: No If all of the above answers are "NO", then may proceed with Cephalosporin use.    VITALS:  Blood  pressure 136/68, pulse 91, temperature 97.9 F (36.6 C), temperature source Oral, resp. rate 16, height '5\' 9"'$  (1.753 m), weight 80.8 kg (178 lb 3.2 oz), SpO2 95 %.  PHYSICAL EXAMINATION:  GENERAL:  76 y.o.-year-old patient lying in the bed with no acute distress.  EYES: Pupils equal, round, reactive to light and accommodation. No scleral icterus. Extraocular muscles intact.  HEENT: Head atraumatic, normocephalic. Oropharynx and nasopharynx clear.  NECK:  Supple, no jugular venous distention. No thyroid enlargement, no tenderness.  LUNGS: Decreased breath sound on right side , no wheezing, some crepitation. No use of accessory muscles of respiration.  on supplemental oxygen.  CARDIOVASCULAR: S1, S2 normal. No murmurs, rubs, or gallops.  ABDOMEN: Soft, nontender, nondistended. Bowel sounds present. No organomegaly or mass.  EXTREMITIES: No pedal edema, cyanosis, or clubbing.  NEUROLOGIC: Cranial nerves II through XII are intact. Muscle strength 4/5 in all extremities. Sensation intact. Gait not checked.  PSYCHIATRIC: The patient is alert and oriented x 3.  SKIN: No obvious rash, lesion, or ulcer.   Physical Exam LABORATORY PANEL:   CBC  Recent Labs Lab 10/28/16 0600  WBC 7.7  HGB 11.3*  HCT 33.9*  PLT 190   ------------------------------------------------------------------------------------------------------------------  Chemistries   Recent Labs Lab 10/24/16 1419  10/28/16 0600  NA 139  < > 138  K 4.6  < > 4.3  CL 96*  < > 94*  CO2 32  < > 39*  GLUCOSE 161*  < > 164*  BUN 16  < > 22*  CREATININE 0.90  < >  0.67  CALCIUM 9.2  < > 8.7*  MG  --   < > 2.2  AST 25  --   --   ALT 13*  --   --   ALKPHOS 56  --   --   BILITOT 0.8  --   --   < > = values in this interval not displayed. ------------------------------------------------------------------------------------------------------------------  Cardiac Enzymes  Recent Labs Lab 10/27/16 0254 10/27/16 0933   TROPONINI 0.04* 0.04*   ------------------------------------------------------------------------------------------------------------------  RADIOLOGY:  No results found.  ASSESSMENT AND PLAN:   Active Problems:   Acute respiratory failure (HCC)   Acute respiratory failure with hypoxia and hypercapnia (HCC)   Healthcare-associated pneumonia   Palliative care by specialist   DNR (do not resuscitate)   Encounter for hospice care discussion   * Acute on chronic respiratory failure with hypoxia   Healthcare associated pneumonia   Right-sided pleural effusion   COPD exacerbation    Patient was in ICU up until now requiring BiPAP support, on broad-spectrum antibiotics.   MRSA PCR negative. Influenza negative.   On IV steroid   Blood cultures growing coagulase-negative Staphylococcus species.  * Right Jugular vein thrombosis   Cont xarelto.  * Hx of CAD   Pt is not on betablocker or statin.   I do not see any reason for not to be on them.   Will ask pt again,a nd start- if no clear answer.  * DM   Lantus + ISS.       All the records are reviewed and case discussed with Care Management/Social Workerr. Management plans discussed with the patient, family and they are in agreement.  CODE STATUS: DNR  TOTAL TIME TAKING CARE OF THIS PATIENT: 35 minutes.     POSSIBLE D/C IN 1-2 DAYS, DEPENDING ON CLINICAL CONDITION.   Vaughan Basta M.D on 10/30/2016   Between 7am to 6pm - Pager - [phone]  After 6pm go to [email or web address] - password [email or web address] [facility]  Office  [phone]  CC: Primary care physician; Madelyn Brunner, MD  Note: This dictation was prepared with Dragon dictation along with smaller phrase technology. Any transcriptional errors that result from this process are unintentional.

## 2016-10-31 ENCOUNTER — Inpatient Hospital Stay: Payer: Medicare Other

## 2016-10-31 LAB — BASIC METABOLIC PANEL
Anion gap: 5 (ref 5–15)
BUN: 21 mg/dL — AB (ref 6–20)
CALCIUM: 9 mg/dL (ref 8.9–10.3)
CO2: 38 mmol/L — ABNORMAL HIGH (ref 22–32)
CREATININE: 0.73 mg/dL (ref 0.61–1.24)
Chloride: 96 mmol/L — ABNORMAL LOW (ref 101–111)
GFR calc Af Amer: >60 mL/min (ref >60)
GFR calc non Af Amer: >60 mL/min (ref >60)
GLUCOSE: 162 mg/dL — AB (ref 65–99)
Potassium: 4.4 mmol/L (ref 3.5–5.1)
Sodium: 139 mmol/L (ref 135–145)

## 2016-10-31 LAB — CBC
HCT: 35.4 % — ABNORMAL LOW (ref 40.0–52.0)
Hemoglobin: 11.7 g/dL — ABNORMAL LOW (ref 13.0–18.0)
MCH: 27.5 pg (ref 26.0–34.0)
MCHC: 33.1 g/dL (ref 32.0–36.0)
MCV: 83.1 fL (ref 80.0–100.0)
PLATELETS: 203 10*3/uL (ref 150–440)
RBC: 4.27 MIL/uL — ABNORMAL LOW (ref 4.40–5.90)
RDW: 16.5 % — AB (ref 11.5–14.5)
WBC: 9.2 10*3/uL (ref 3.8–10.6)

## 2016-10-31 LAB — GLUCOSE, CAPILLARY
GLUCOSE-CAPILLARY: 130 mg/dL — AB (ref 65–99)
GLUCOSE-CAPILLARY: 134 mg/dL — AB (ref 65–99)
Glucose-Capillary: 152 mg/dL — ABNORMAL HIGH (ref 65–99)
Glucose-Capillary: 154 mg/dL — ABNORMAL HIGH (ref 65–99)
Glucose-Capillary: 159 mg/dL — ABNORMAL HIGH (ref 65–99)
Glucose-Capillary: 179 mg/dL — ABNORMAL HIGH (ref 65–99)

## 2016-10-31 MED ORDER — POLYETHYLENE GLYCOL 3350 17 G PO PACK
17.0000 g | PACK | Freq: Once | ORAL | Status: AC
  Administered 2016-10-31: 17:00:00 17 g via ORAL
  Filled 2016-10-31: qty 1

## 2016-10-31 MED ORDER — PREDNISONE 50 MG PO TABS
50.0000 mg | ORAL_TABLET | Freq: Every day | ORAL | Status: DC
  Administered 2016-11-01: 50 mg via ORAL
  Filled 2016-10-31: qty 1

## 2016-10-31 MED ORDER — IPRATROPIUM-ALBUTEROL 0.5-2.5 (3) MG/3ML IN SOLN
3.0000 mL | Freq: Four times a day (QID) | RESPIRATORY_TRACT | Status: DC
  Administered 2016-10-31 – 2016-11-01 (×5): 3 mL via RESPIRATORY_TRACT
  Filled 2016-10-31 (×5): qty 3

## 2016-10-31 NOTE — Clinical Social Work Note (Signed)
CSW received referral for SNF.  Case discussed with case manager and plan is to discharge home with hospice.  CSW to sign off please re-consult if social work needs arise.  Jones Broom. [place], MSW, [place]

## 2016-10-31 NOTE — Progress Notes (Signed)
[facility] [place] NAME: Richard Jimenez    MR#:  [id number]  DATE OF BIRTH:  1940-10-14  SUBJECTIVE:  CHIEF COMPLAINT:   Chief Complaint  Patient presents with  . Shortness of Breath     Admitted with respiratory failure and pneumonia, was in ICU with BiPAP support for last few days - transferred to medical floor in hospitalist service today. He is on 3 L oxygen at home at his baseline, currently requiring 4 L of oxygen and feels slightly short of breath but much better than before. He was advised to use BiPAP machine at nighttime here , he has a cpap at home. He feels comfortable to go home. Family had meeting today to discuss requirements with hospice, and plannign to take him home once the supplies are there tomorrow.   REVIEW OF SYSTEMS:  CONSTITUTIONAL: No fever, fatigue or weakness.  EYES: No blurred or double vision.  EARS, NOSE, AND THROAT: No tinnitus or ear pain.  RESPIRATORY: No cough,Some  shortness of breath, wheezing , no hemoptysis.  CARDIOVASCULAR: No chest pain, orthopnea, edema.  GASTROINTESTINAL: No nausea, vomiting, diarrhea or abdominal pain.  GENITOURINARY: No dysuria, hematuria.  ENDOCRINE: No polyuria, nocturia,  HEMATOLOGY: No anemia, easy bruising or bleeding SKIN: No rash or lesion. MUSCULOSKELETAL: No joint pain or arthritis.   NEUROLOGIC: No tingling, numbness, weakness.  PSYCHIATRY: No anxiety or depression.   ROS  DRUG ALLERGIES:   Allergies  Allergen Reactions  . Clarithromycin Nausea And Vomiting  . Penicillins Nausea And Vomiting, Rash and Other (See Comments)    Has patient had a PCN reaction causing immediate rash, facial/tongue/throat swelling, SOB or lightheadedness with hypotension: No Has patient had a PCN reaction causing severe rash involving mucus membranes or skin necrosis: No Has patient had a PCN reaction that required hospitalization No Has patient had a PCN reaction occurring within the  last 10 years: No If all of the above answers are "NO", then may proceed with Cephalosporin use.    VITALS:  Blood pressure 122/63, pulse (!) 101, temperature 97.6 F (36.4 C), resp. rate 20, height _0  (1.753 m), weight 80.7 kg (178 lb), SpO2 97 %.  PHYSICAL EXAMINATION:  GENERAL:  76 y.o.-year-old patient lying in the bed with no acute distress.  EYES: Pupils equal, round, reactive to light and accommodation. No scleral icterus. Extraocular muscles intact.  HEENT: Head atraumatic, normocephalic. Oropharynx and nasopharynx clear.  NECK:  Supple, no jugular venous distention. No thyroid enlargement, no tenderness.  LUNGS: Decreased breath sound on right side , no wheezing, some crepitation. No use of accessory muscles of respiration.  on supplemental oxygen.  CARDIOVASCULAR: S1, S2 normal. No murmurs, rubs, or gallops.  ABDOMEN: Soft, nontender, nondistended. Bowel sounds present. No organomegaly or mass.  EXTREMITIES: No pedal edema, cyanosis, or clubbing.  NEUROLOGIC: Cranial nerves II through XII are intact. Muscle strength 4/5 in all extremities. Sensation intact. Gait not checked.  PSYCHIATRIC: The patient is alert and oriented x 3.  SKIN: No obvious rash, lesion, or ulcer.   Physical Exam LABORATORY PANEL:   CBC  Recent Labs Lab 10/31/16 0404  WBC 9.2  HGB 11.7*  HCT 35.4*  PLT 203   ------------------------------------------------------------------------------------------------------------------  Chemistries   Recent Labs Lab 10/28/16 0600 10/31/16 0404  NA 138 139  K 4.3 4.4  CL 94* 96*  CO2 39* 38*  GLUCOSE 164* 162*  BUN 22* 21*  CREATININE 0.67 0.73  CALCIUM 8.7* 9.0  MG 2.2  --    ------------------------------------------------------------------------------------------------------------------  Cardiac Enzymes  Recent Labs Lab 10/27/16 0254 10/27/16 0933  TROPONINI 0.04* 0.04*    ------------------------------------------------------------------------------------------------------------------  RADIOLOGY:  Dg Chest 2 View  Result Date: 10/31/2016 CLINICAL DATA:  Pleural effusion, shortness of breath, respiratory failure. History of lung cancer. EXAM: CHEST  2 VIEW COMPARISON:  10/28/2016 FINDINGS: Small bilateral pleural effusions. Bibasilar atelectasis or infiltrates, right greater than left, similar to prior study. Heart is borderline in size. Persistent chronic density at the left apex is unchanged. Diffuse interstitial prominence could reflect interstitial edema. IMPRESSION: Bibasilar atelectasis or infiltrates with small effusions, right greater than left. Diffuse interstitial prominence could reflect interstitial edema. Stable left apical density. Electronically Signed   By: Rolm Baptise M.D.   On: 10/31/2016 08:23    ASSESSMENT AND PLAN:   Active Problems:   Acute respiratory failure (HCC)   Acute respiratory failure with hypoxia and hypercapnia (HCC)   Healthcare-associated pneumonia   Palliative care by specialist   DNR (do not resuscitate)   Encounter for hospice care discussion   * Acute on chronic respiratory failure with hypoxia   Healthcare associated pneumonia   Right-sided pleural effusion   COPD exacerbation    Patient was in ICU up until now requiring BiPAP support, on broad-spectrum antibiotics.   MRSA PCR negative. Influenza negative.   On IV steroid   Blood cultures growing coagulase-negative Staphylococcus species.   Finished 7 days of IV Abx.  * Right Jugular vein thrombosis   Cont xarelto.  * Hx of CAD   Pt is not on betablocker or statin.   I do not see any reason for not to be on them.   I spoke to pt's wife- she don't recall Primary care physician telling any time about this.   I encouraged them to discuss with him about use of a beta blocker and they would do it.  * DM   Lantus + ISS.    PT suggested for SNF- but and pt  and family prefers hospice at home over it. Hospice met with pt, they are arranging supplies at home today.  All the records are reviewed and case discussed with Care Management/Social Workerr. Management plans discussed with the patient, family and they are in agreement.  CODE STATUS: DNR  TOTAL TIME TAKING CARE OF THIS PATIENT: 35 minutes.    POSSIBLE D/C IN 1-2 DAYS, DEPENDING ON CLINICAL CONDITION.   Vaughan Basta M.D on 10/31/2016   Between 7am to 6pm - Pager - [phone]  After 6pm go to [email or web address] - password [email or web address] [facility]  Office  [phone]  CC: Primary care physician; Madelyn Brunner, MD  Note: This dictation was prepared with Dragon dictation along with smaller phrase technology. Any transcriptional errors that result from this process are unintentional.

## 2016-10-31 NOTE — Progress Notes (Signed)
Follow up on new referral for hospice at home post [facility] discharge.  Patient plans to discharge home tomorrow.  Family meeting held with patient's wife, Giovani Neumeister and 3 daughters- Anselmo Pickler, Tia Alert and Newman Pies to discuss hospice services, philosophy and hospice benefits.  All are in agreement with hospice services and request hospice in the patient's home at discharge.  Long discussion had concerning disease process and disease progression and EOL care.  Discussed goal oriented care and comfort vs. Aggressive approach- which patient and family are all in agreement with.  Discussed patient's current diagnosis and disease process with expected outcome and signs/symptoms of decline. Patient lives at home with his wife and is a very independent and outspoken man.  Discussed with the family at this time due to his dyspnea, control is all he has and discussed the importance of Mr. Hufnagle being able to make decision and participate in his care, wants and desires.  All in agreement.  Patient has good family support.  Wife, Thayer Headings had right rotator cuff surgery in October and is still recovering and not physically able to do a lot of pulling.  Current DME in the home:  Oxygen through advanced home care, nebulizer  DME Needs prior to discharge:  Oxygen (higher concentrator over 5L... Family in agreement to change to choice), BSC, over bed table and transport wheelchair. Updated information faxed to referral intake.  Thank you for allowing participation in this patient's care. Will continue to follow through final disposition.  Dimas Aguas, RN Clinical nurse liaison [facility] [phone]

## 2016-11-01 LAB — GLUCOSE, CAPILLARY
Glucose-Capillary: 157 mg/dL — ABNORMAL HIGH (ref 65–99)
Glucose-Capillary: 185 mg/dL — ABNORMAL HIGH (ref 65–99)
Glucose-Capillary: 81 mg/dL (ref 65–99)
Glucose-Capillary: 88 mg/dL (ref 65–99)

## 2016-11-01 MED ORDER — PREDNISONE 10 MG (21) PO TBPK: ORAL_TABLET | ORAL | 0 refills | Status: AC

## 2016-11-01 MED ORDER — ALPRAZOLAM 0.5 MG PO TABS: 0.5000 mg | ORAL_TABLET | Freq: Three times a day (TID) | ORAL | 0 refills | Status: AC | PRN

## 2016-11-01 MED ORDER — MORPHINE SULFATE (CONCENTRATE) 10 MG/0.5ML PO SOLN: 5.0000 mg | ORAL | 0 refills | Status: AC | PRN

## 2016-11-01 MED ORDER — BUDESONIDE 0.5 MG/2ML IN SUSP: 0.5000 mg | Freq: Two times a day (BID) | RESPIRATORY_TRACT | 0 refills | Status: AC

## 2016-11-01 NOTE — Care Management (Signed)
Discharge to home today per Dr. Anselm Jungling. [facility] will be following in the home. Transportation will be arranged to [facility] unit                                  [facility] Management

## 2016-11-01 NOTE — Progress Notes (Signed)
Patient discharged home with hospice. All discharge instructions given and all questions answered. EMS called for transport.

## 2016-11-02 ENCOUNTER — Inpatient Hospital Stay: Payer: Medicare Other | Admitting: Internal Medicine

## 2016-11-02 ENCOUNTER — Inpatient Hospital Stay: Payer: Medicare Other

## 2016-11-03 LAB — SUSCEPTIBILITY RESULT

## 2016-11-03 LAB — SUSCEPTIBILITY, AER + ANAEROB

## 2016-11-05 ENCOUNTER — Ambulatory Visit: Payer: Medicare Other | Admitting: Radiation Oncology

## 2016-11-10 NOTE — Discharge Summary (Signed)
[facility] [place] NAME: Richard Jimenez    MR#:  [id number]  DATE OF BIRTH:  December 26, 1940  DATE OF ADMISSION:  10/24/2016 ADMITTING PHYSICIAN: Laverle Hobby, MD  DATE OF DISCHARGE: 11/01/2016  2:10 PM  PRIMARY CARE PHYSICIAN: Madelyn Brunner, MD    ADMISSION DIAGNOSIS:  Healthcare-associated pneumonia [J18.9] Acute respiratory failure with hypercapnia (HCC) [J96.02] Elevated troponin I level [R74.8]  DISCHARGE DIAGNOSIS:  Active Problems:   Acute respiratory failure (HCC)   Acute respiratory failure with hypoxia and hypercapnia (HCC)   Healthcare-associated pneumonia   Palliative care by specialist   DNR (do not resuscitate)   Encounter for hospice care discussion   SECONDARY DIAGNOSIS:   Past Medical History:  Diagnosis Date  . Asthma   . CML (chronic myelocytic leukemia) ([place])   . COPD (chronic obstructive pulmonary disease) ([place])   . Diabetes mellitus type II, controlled ([place])   . Diabetes mellitus without complication ([place])   . Hypertension   . 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 Lung cancer, upper lobe ([place]) 2009  . Myocardial infarct   . Small cell lung cancer [facility])     HOSPITAL COURSE:   * Acute on chronic respiratory failure with hypoxia   Healthcare associated pneumonia   Right-sided pleural effusion   COPD exacerbation    Patient was in ICU up until now requiring BiPAP support, on broad-spectrum antibiotics.   MRSA PCR negative. Influenza negative.   On IV steroid   Blood cultures growing coagulase-negative Staphylococcus species.   Finished 7 days of IV Abx.  * Right Jugular vein thrombosis   Cont xarelto.  * Hx of CAD   Pt is not on betablocker or statin.   I do not see any reason for not to be on them.   I spoke to pt's wife- she don't recall Primary care physician telling any time about this.   I encouraged them to discuss with him about use of a beta blocker and they would  do it.  * DM   Lantus + ISS.    PT suggested for SNF- but and pt and family prefers hospice at home over it. Hospice met with pt, they are arranging supplies at home today.  DISCHARGE CONDITIONS:   Stable.  CONSULTS OBTAINED:    DRUG ALLERGIES:   Allergies  Allergen Reactions  . Clarithromycin Nausea And Vomiting  . Penicillins Nausea And Vomiting, Rash and Other (See Comments)    Has patient had a PCN reaction causing immediate rash, facial/tongue/throat swelling, SOB or lightheadedness with hypotension: No Has patient had a PCN reaction causing severe rash involving mucus membranes or skin necrosis: No Has patient had a PCN reaction that required hospitalization No Has patient had a PCN reaction occurring within the last 10 years: No If all of the above answers are "NO", then may proceed with Cephalosporin use.    DISCHARGE MEDICATIONS:   Discharge Medication List as of 11/01/2016  1:11 PM    START taking these medications   Details  budesonide (PULMICORT) 0.5 MG/2ML nebulizer solution Take 2 mLs (0.5 mg total) by nebulization 2 (two) times daily., Starting Thu 11/01/2016, Print    Morphine Sulfate (MORPHINE CONCENTRATE) 10 MG/0.5ML SOLN concentrated solution Take 0.25 mLs (5 mg total) by mouth every 2 (two) hours as needed for severe pain or shortness of breath (air hunger)., Starting Thu 11/01/2016, Print    predniSONE (STERAPRED UNI-PAK 21 TAB) 10 MG (21) TBPK  tablet Take 6 tabs first day, 5 tab on day 2, then 4 on day 3rd, 3 tabs on day 4th , 2 tab on day 5th, and 1 tab on 6th day., Print      CONTINUE these medications which have CHANGED   Details  ALPRAZolam (XANAX) 0.5 MG tablet Take 1 tablet (0.5 mg total) by mouth 3 (three) times daily as needed for anxiety., Starting Thu 11/01/2016, Print      CONTINUE these medications which have NOT CHANGED   Details  albuterol (PROAIR HFA) 108 (90 Base) MCG/ACT inhaler Inhale 2 puffs into the lungs every 6 (six) hours as  needed for wheezing or shortness of breath., Starting Mon 07/02/2016, Normal    ipratropium-albuterol (DUONEB) 0.5-2.5 (3) MG/3ML SOLN Take 3 mLs by nebulization every 6 (six) hours as needed (shortness of breath)., Starting Wed 05/23/2016, Print    metFORMIN (GLUCOPHAGE) 1000 MG tablet Take 1,000 mg by mouth 2 (two) times daily with a meal. Dissolve tablet in 4 tablespoons of warm water,  Swish and swallow, Historical Med    rivaroxaban (XARELTO) 20 MG TABS tablet Take 1 tablet (20 mg total) by mouth daily with supper., Starting Wed 10/03/2016, Normal    traMADol (ULTRAM) 50 MG tablet Take 50 mg by mouth every 6 (six) hours as needed., Historical Med    triamcinolone cream (KENALOG) 0.1 % Apply 1 application topically 2 (two) times daily., Historical Med         DISCHARGE INSTRUCTIONS:    Follow with PMD.  If you experience worsening of your admission symptoms, develop shortness of breath, life threatening emergency, suicidal or homicidal thoughts you must seek medical attention immediately by calling 911 or calling your MD immediately  if symptoms less severe.  You Must read complete instructions/literature along with all the possible adverse reactions/side effects for all the Medicines you take and that have been prescribed to you. Take any new Medicines after you have completely understood and accept all the possible adverse reactions/side effects.   Please note  You were cared for by a hospitalist during your hospital stay. If you have any questions about your discharge medications or the care you received while you were in the hospital after you are discharged, you can call the unit and asked to speak with the hospitalist on call if the hospitalist that took care of you is not available. Once you are discharged, your primary care physician will handle any further medical issues. Please note that NO REFILLS for any discharge medications will be authorized once you are discharged, as it  is imperative that you return to your primary care physician (or establish a relationship with a primary care physician if you do not have one) for your aftercare needs so that they can reassess your need for medications and monitor your lab values.    Today   CHIEF COMPLAINT:   Chief Complaint  Patient presents with  . Shortness of Breath    HISTORY OF PRESENT ILLNESS:  Richard Jimenez  is a 76 y.o. 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  [facility] with c/o  worsening shortness of breath, wheezing, and coughing onset 2 weeks prior to presentation to ER.  Due to worsening symptoms EMS notified upon arrival O2 sats were 85% with exertion.  EMS administered a total of 4 breathing treatments and 125 mg solumedrol.  In the ER pts O2 sats on nonrebreather were 85% therefore he was placed on Bipap.  He has used an entire inhaler in 1 week due to symptoms without relief. He recently stopped radiation 3 weeks ago because he states it was making him worse.  He denies sick contacts, fever, nausea, or vomiting.  PCCM contacted to admit pt on 01/17 for acute on chronic hypoxic hypercapnic respiratory failure secondary to AECOPD and ?LLL Pneumonia requiring Bipap.   VITAL SIGNS:  Blood pressure 127/60, pulse 84, temperature 97.5 F (36.4 C), temperature source Oral, resp. rate 20, height '5\' 9"'$  (1.753 m), weight 81.7 kg (180 lb 1.6 oz), SpO2 93 %.  I/O:  No intake or output data in the 24 hours ending 11/10/16 0750  PHYSICAL EXAMINATION:   GENERAL:  76  y.o.-year-old patient lying in the bed with no acute distress.  EYES: Pupils equal, round, reactive to light and accommodation. No scleral icterus. Extraocular muscles intact.  HEENT: Head atraumatic, normocephalic. Oropharynx and nasopharynx clear.  NECK:  Supple, no jugular venous distention. No thyroid enlargement, no tenderness.  LUNGS: Decreased breath sound on right side , no wheezing, some crepitation. No use of accessory muscles of respiration.  on supplemental oxygen.  CARDIOVASCULAR: S1, S2 normal. No murmurs, rubs, or gallops.  ABDOMEN: Soft, nontender, nondistended. Bowel sounds present. No organomegaly or mass.  EXTREMITIES: No pedal edema, cyanosis, or clubbing.  NEUROLOGIC: Cranial nerves II through XII are intact. Muscle strength 4/5 in all extremities. Sensation intact. Gait not checked.  PSYCHIATRIC: The patient is alert and oriented x 3.  SKIN: No obvious rash, lesion, or ulcer.   DATA REVIEW:   CBC No results for input(s): WBC, HGB, HCT, PLT in the last 168 hours.  Chemistries  No results for input(s): NA, K, CL, CO2, GLUCOSE, BUN, CREATININE, CALCIUM, MG, AST, ALT, ALKPHOS, BILITOT in the last 168 hours.  Invalid input(s): GFRCGP  Cardiac Enzymes No results for input(s): TROPONINI in the last 168 hours.  Microbiology Results  Results for orders placed or performed during the hospital encounter of 10/24/16  Blood culture (routine x 2)     Status: Abnormal (Preliminary result)   Collection Time: 10/24/16 12:52 PM  Result Value Ref Range Status   Specimen Description BLOOD LEFT AC  Final   Special Requests   Final    BOTTLES DRAWN AEROBIC AND ANAEROBIC AER 9ML ANA 12ML   Culture  Setup Time   Final    GRAM POSITIVE COCCI IN BOTH AEROBIC AND ANAEROBIC BOTTLES CRITICAL RESULT CALLED TO, READ BACK BY AND VERIFIED WITH:  KAREN HAYES AT 0930 10/25/16 SDR    Culture (A)  Final    STAPHYLOCOCCUS SPECIES (COAGULASE NEGATIVE) UNABLE TO OBTAIN SENSITIVITY RESULTS BY  ROUTINE METHODS. Sent to [place] for further susceptibility testing. RESULT CALLED TO, READ BACK BY AND VERIFIED WITH: K CLAYTON,RN AT 1000 10/28/16 BY L BENFIELD CONCERNING RESULT DELAY Performed at [facility], [place] [address], [place], [place] [address]    Report Status PENDING  Incomplete  Blood Culture ID Panel (Reflexed)     Status: Abnormal   Collection Time: 10/24/16 12:52 PM  Result Value Ref Range Status   Enterococcus species NOT DETECTED NOT DETECTED Final  Listeria monocytogenes NOT DETECTED NOT DETECTED Final   Staphylococcus species DETECTED (A) NOT DETECTED Final    Comment: CRITICAL RESULT CALLED TO, READ BACK BY AND VERIFIED WITH:  KAREN HAYES AT 0930 10/25/16 SDR    Staphylococcus aureus NOT DETECTED NOT DETECTED Final   Methicillin resistance NOT DETECTED NOT DETECTED Final   Streptococcus species NOT DETECTED NOT DETECTED Final   Streptococcus agalactiae NOT DETECTED NOT DETECTED Final   Streptococcus pneumoniae NOT DETECTED NOT DETECTED Final   Streptococcus pyogenes NOT DETECTED NOT DETECTED Final   Acinetobacter baumannii NOT DETECTED NOT DETECTED Final   Enterobacteriaceae species NOT DETECTED NOT DETECTED Final   Enterobacter cloacae complex NOT DETECTED NOT DETECTED Final   Escherichia coli NOT DETECTED NOT DETECTED Final   Klebsiella oxytoca NOT DETECTED NOT DETECTED Final   Klebsiella pneumoniae NOT DETECTED NOT DETECTED Final   Proteus species NOT DETECTED NOT DETECTED Final   Serratia marcescens NOT DETECTED NOT DETECTED Final   Haemophilus influenzae NOT DETECTED NOT DETECTED Final   Neisseria meningitidis NOT DETECTED NOT DETECTED Final   Pseudomonas aeruginosa NOT DETECTED NOT DETECTED Final   Candida albicans NOT DETECTED NOT DETECTED Final   Candida glabrata NOT DETECTED NOT DETECTED Final   Candida krusei NOT DETECTED NOT DETECTED Final   Candida parapsilosis NOT DETECTED NOT DETECTED Final   Candida tropicalis NOT DETECTED NOT DETECTED  Final  Susceptibility, Aer + Anaerob     Status: None   Collection Time: 10/24/16 12:52 PM  Result Value Ref Range Status   Suscept, Aer + Anaerob Final report  Corrected    Comment: (NOTE) Performed At: [facility] [address] [place], [place] [id number] Lindon Romp MD BJ:[id number] CORRECTED ON 01/27 AT 0538: PREVIOUSLY REPORTED AS Preliminary report    Source of Sample AEROBIC BOTTLE ONLY  Final    Comment: Performed at [facility], [place] [address] [place], [place] [address]  Susceptibility Result     Status: Abnormal   Collection Time: 10/24/16 12:52 PM  Result Value Ref Range Status   Suscept Result 1 Comment (A)  Final    Comment: (NOTE) Coagulase negative Staphylococcus species. Most isolates of Staphylococcus sp. produce a beta-lactamase enzyme rendering them resistant to penicillin. Please contact the laboratory if penicillin is being considered for therapy. Based on susceptibility to oxacillin this isolate would be susceptible to: * Beta-lactam/beta-lactamase inhibitor combinations; such as:    Amoxicillin-clavulanic acid    Ampicillin-sulbactam * Antistaphylococcal cephems; such as:    Cefaclor    Cefuroxime * Antistaphylococcal carbapenems; such as:    Imipenem    Meropenem Clindamycin      S Erythromycin     R    Antimicrobial Suscept Comment  Final    Comment: (NOTE)      ** S = Susceptible; I = Intermediate; R = Resistant **                   P = Positive; N = Negative            MICS are expressed in micrograms per mL   Antibiotic                 RSLT#1    RSLT#2    RSLT#3    RSLT#4 Ciprofloxacin                  S Clindamycin  S Erythromycin                   R Gentamicin                     S Levofloxacin                   S Nitrofurantoin                 S Oxacillin                      S Rifampin                       S Tetracycline                   S Trimethoprim/Sulfa             S Vancomycin                      S Performed At: [facility] [place], [place] [id number] Lindon Romp MD NO:[id number] Performed at [facility], [place] [address], [place], [place] [address]   Blood culture (routine x 2)     Status: Abnormal (Preliminary result)   Collection Time: 10/24/16  1:05 PM  Result Value Ref Range Status   Specimen Description BLOOD R WRIST  Final   Special Requests   Final    BOTTLES DRAWN AEROBIC AND ANAEROBIC AER 3ML ANA 6ML   Culture  Setup Time   Final    GRAM POSITIVE COCCI IN BOTH AEROBIC AND ANAEROBIC BOTTLES CRITICAL VALUE NOTED.  VALUE IS CONSISTENT WITH PREVIOUSLY REPORTED AND CALLED VALUE.    Culture STAPHYLOCOCCUS SPECIES (COAGULASE NEGATIVE) (A)  Final   Report Status PENDING  Incomplete  MRSA PCR Screening     Status: None   Collection Time: 10/24/16  5:13 PM  Result Value Ref Range Status   MRSA by PCR NEGATIVE NEGATIVE Final    Comment:        The GeneXpert MRSA Assay (FDA approved for NASAL specimens only), is one component of a comprehensive MRSA colonization surveillance program. It is not intended to diagnose MRSA infection nor to guide or monitor treatment for MRSA infections.     RADIOLOGY:  No results found.  EKG:   Orders placed or performed during the hospital encounter of 10/24/16  . ED EKG  . ED EKG  . EKG 12-Lead  . EKG 12-Lead  . EKG 12-Lead  . EKG 12-Lead      Management plans discussed with the patient, family and they are in agreement.  CODE STATUS:  Code Status History    Date Active Date Inactive Code Status Order ID Comments User Context   10/26/2016  1:16 PM 11/01/2016  5:35 PM DNR [id number]  Basilio Cairo, NP Inpatient   10/24/2016  3:36 PM 10/26/2016  1:16 PM Partial Code [id number]  Awilda Bill, NP ED   05/18/2016  7:07 AM 05/18/2016  3:57 PM DNR [id number]  Harrie Foreman, MD ED   05/18/2016  4:12 AM 05/18/2016  7:07 AM DNR [id number]  Earleen Newport, MD ED   04/18/2016  5:35 PM  04/20/2016  2:33 PM Partial Code [id number]  Demetrios Loll, MD Inpatient   04/18/2016  5:23 PM 04/18/2016  5:35 PM Full Code [id number]  Demetrios Loll, MD Inpatient  04/06/2016  9:33 PM 04/09/2016  5:34 PM Full Code [id number]  Theodoro Grist, MD Inpatient    Questions for Most Recent Historical Code Status (Order [id number])    Question Answer Comment   In the event of cardiac or respiratory ARREST Do not call a "code blue"    In the event of cardiac or respiratory ARREST Do not perform Intubation, CPR, defibrillation or ACLS    In the event of cardiac or respiratory ARREST Use medication by any route, position, wound care, and other measures to relive pain and suffering. May use oxygen, suction and manual treatment of airway obstruction as needed for comfort.         Advance Directive Documentation   Flowsheet Row Most Recent Value  Type of Advance Directive  Out of facility DNR (pink MOST or yellow form)  Pre-existing out of facility DNR order (yellow form or pink MOST form)  No data  "MOST" Form in Place?  No data      TOTAL TIME TAKING CARE OF THIS PATIENT: 35 minutes.    Vaughan Basta M.D on 11/10/2016 at 7:50 AM  Between 7am to 6pm - Pager - [phone]  After 6pm go to [email or web address] - password [email or web address]  Office  [phone]  CC: Primary care physician; Madelyn Brunner, MD   Note: This dictation was prepared with Dragon dictation along with smaller phrase technology. Any transcriptional errors that result from this process are unintentional.

## 2016-11-15 ENCOUNTER — Telehealth: Payer: Self-pay | Admitting: *Deleted

## 2016-11-15 MED ORDER — HYDROCOD POLST-CPM POLST ER 10-8 MG/5ML PO SUER: 5.0000 mL | Freq: Two times a day (BID) | ORAL | 0 refills | Status: AC

## 2016-11-15 NOTE — Telephone Encounter (Signed)
Asking if it is alright for him to take Nyquil as Robitussin is not working. Please advise

## 2016-11-15 NOTE — Telephone Encounter (Signed)
Message was left on Richard Jimenez's VM regarding this at 150 PM

## 2016-11-15 NOTE — Telephone Encounter (Signed)
Per Dr Rogue Bussing, Tussionex 5 ml bid

## 2016-11-17 ENCOUNTER — Other Ambulatory Visit: Payer: Self-pay | Admitting: Internal Medicine

## 2016-11-21 ENCOUNTER — Telehealth: Payer: Self-pay | Admitting: *Deleted

## 2016-11-21 NOTE — Telephone Encounter (Signed)
Called to report that he was sent to [facility] yesterday for symptom management and when she went to check on him this morning, she finds that he is actively dying

## 2016-12-06 DEATH — deceased

## 2016-12-11 LAB — CULTURE, BLOOD (ROUTINE X 2)

## 2017-01-21 ENCOUNTER — Other Ambulatory Visit: Payer: Self-pay | Admitting: Nurse Practitioner

## 2017-08-10 IMAGING — DX DG CHEST 1V PORT
1 series · 1 of 1 positions shown · non-contrast
Comparison: Chest radiograph performed 12/28/2015, and CT of the
chest performed 03/30/2016

CLINICAL DATA: Acute onset of wheezing and tachypnea. Tachycardia.
Initial encounter.

EXAM:
PORTABLE CHEST 1 VIEW

[chest ap]
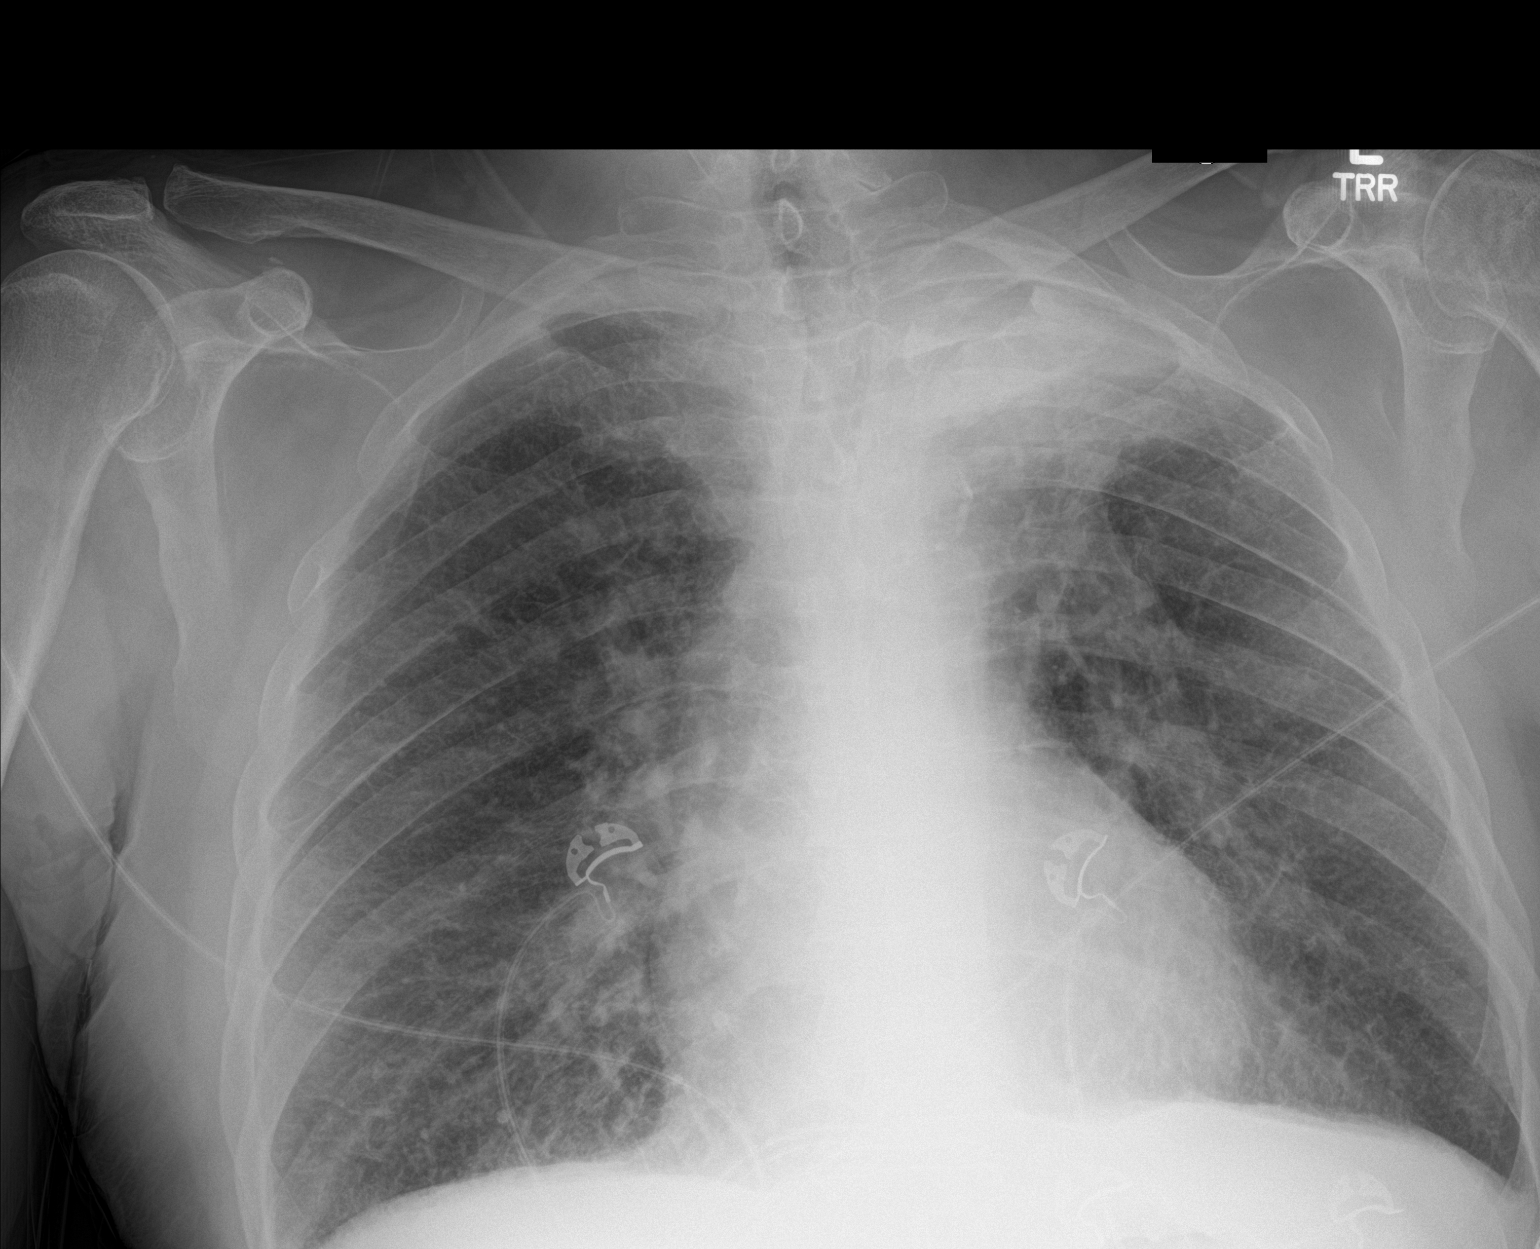

[1 of 1 positions shown; findings below may reference images not displayed]

FINDINGS: The lungs are well-aerated. Left apical opacity is again seen. This
may simply reflect post therapy scarring, though residual or
recurrent mass cannot be entirely excluded. Vascular congestion is
seen. Increased interstitial markings may reflect mild interstitial
edema. No pleural effusion or pneumothorax is seen.

The cardiomediastinal silhouette is within normal limits. No acute
osseous abnormalities are seen.
IMPRESSION: 1. Increased interstitial markings may reflect mild interstitial
edema. Vascular congestion noted.
2. Left apical opacity again noted. This may simply reflect post
therapy scarring, though residual or recurrent mass cannot be
entirely excluded. This could be assessed on PET/CT, depending on
the degree of clinical concern.

## 2017-08-12 IMAGING — CT CT CHEST W/O CM
2 of 3 series · 15 of 36 positions shown, 18 images · non-contrast
Comparison: 03/30/2016

CLINICAL DATA: Shortness of breath. COPD. Previous myocardial
infarct. Left upper lobe lung carcinoma.

EXAM:
CT CHEST WITHOUT CONTRAST
TECHNIQUE: Multidetector CT imaging of the chest was performed following the
standard protocol without IV contrast.

[Series 2: thorax · axial · 0.80mm/px · z∈[-150,+84]mm · 12 of 139 slices shown, 15 images]
[im 11/139  mediastinal]
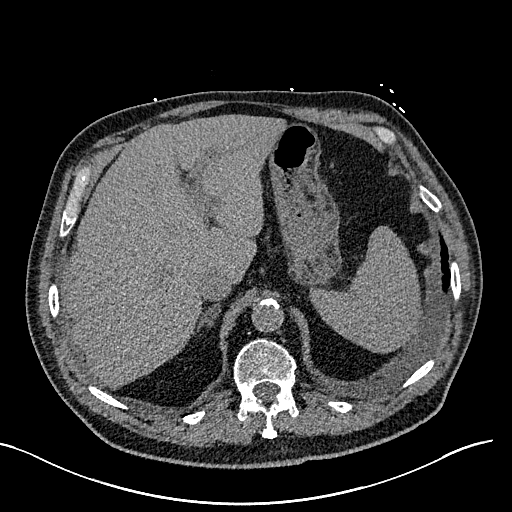
[im 11/139  lung]
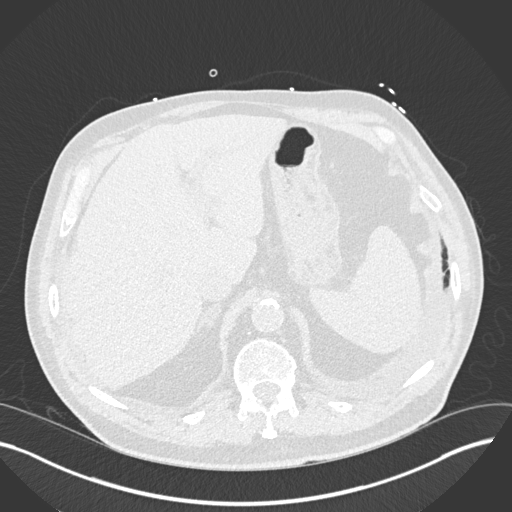
[im 21/139  lung]
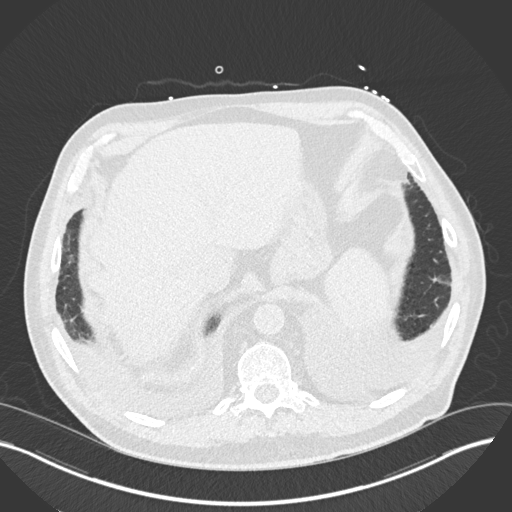
[im 31/139  lung]
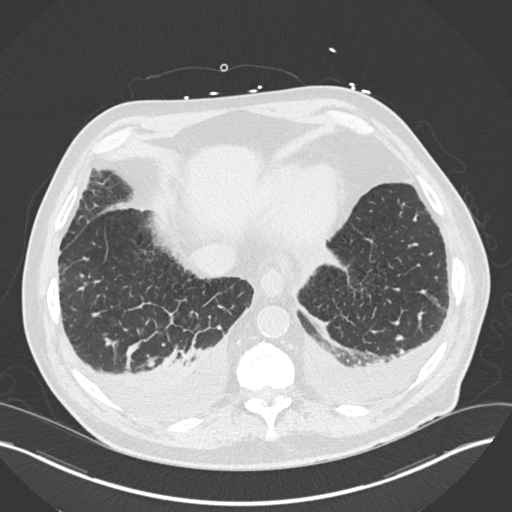
[im 41/139  lung]
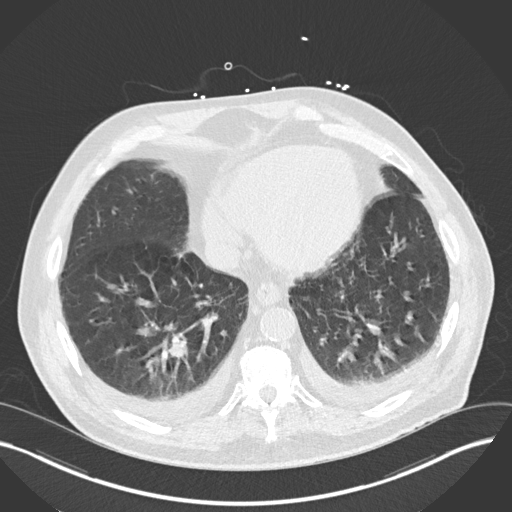
[im 52/139  mediastinal]
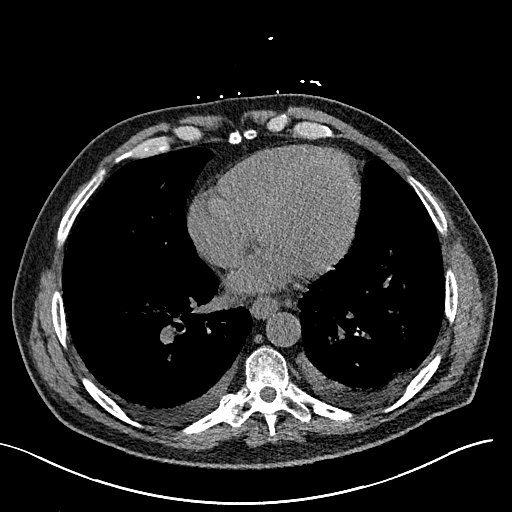
[im 52/139  lung]
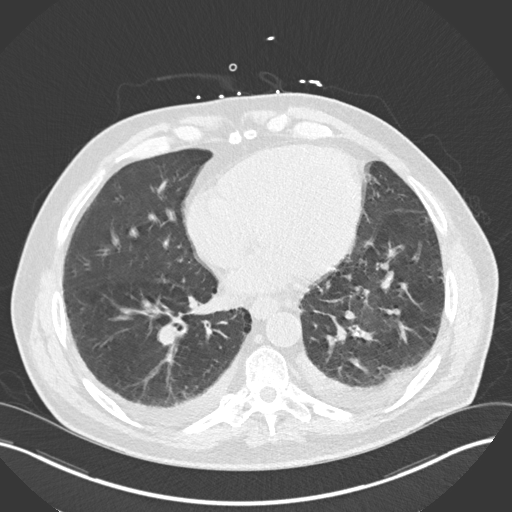
[im 62/139  lung]
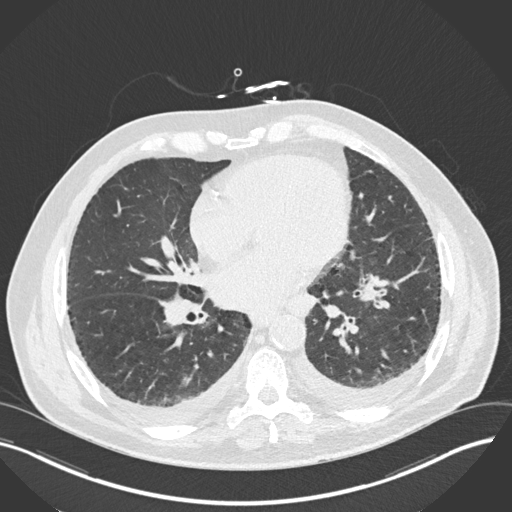
[im 77/139  lung]
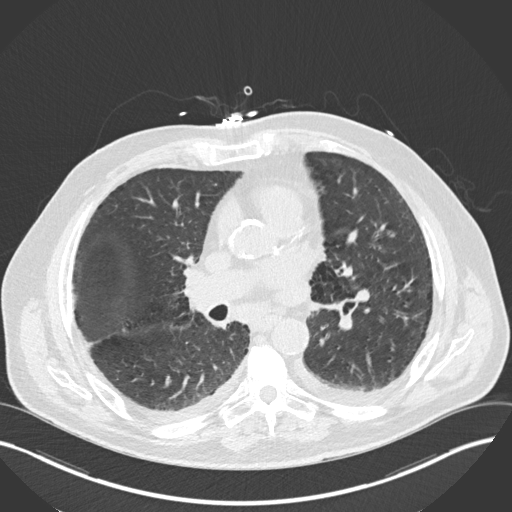
[im 87/139  lung]
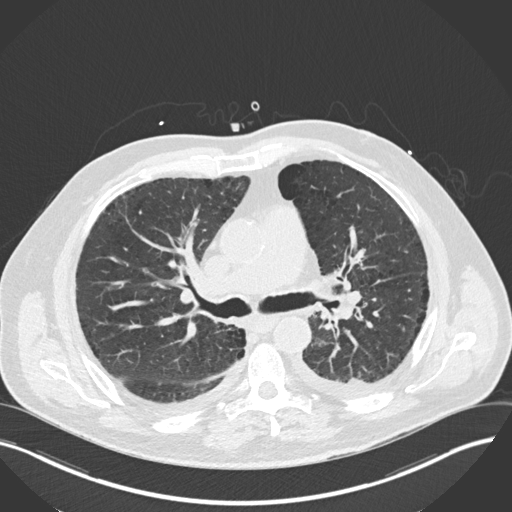
[im 98/139  mediastinal]
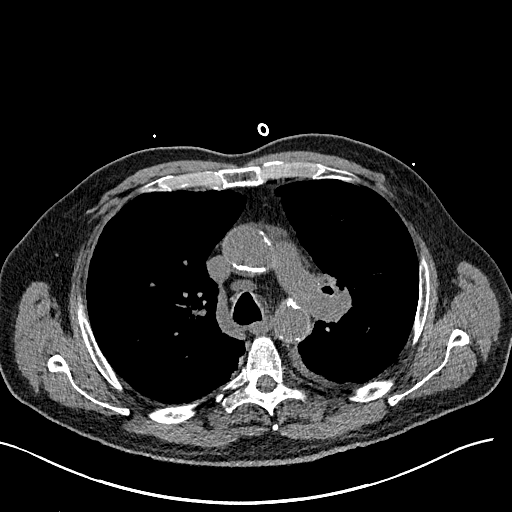
[im 98/139  lung]
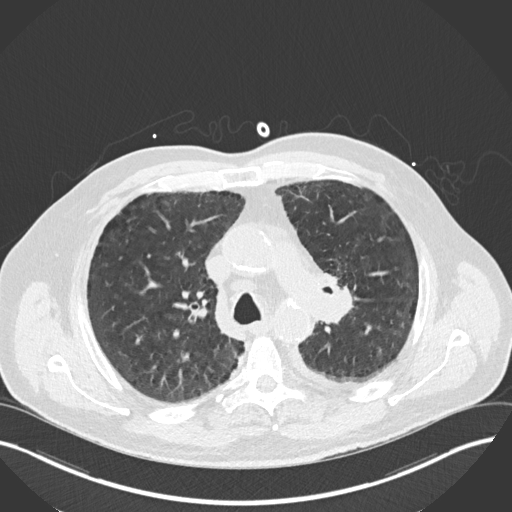
[im 108/139  lung]
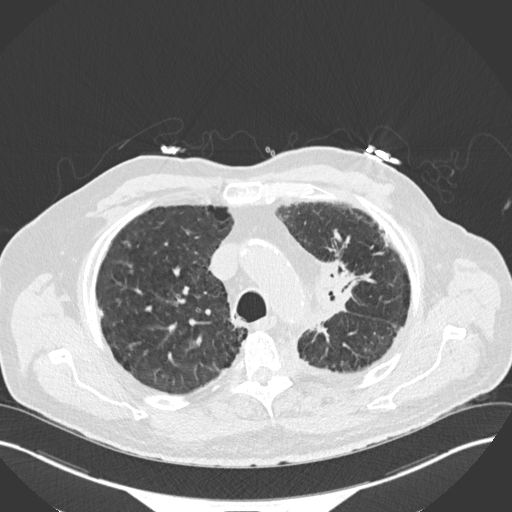
[im 118/139  lung]
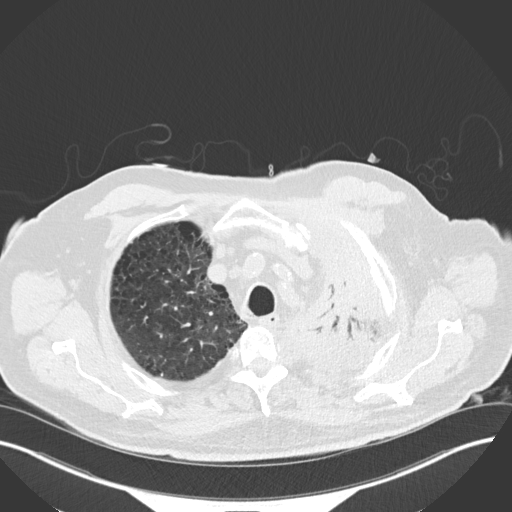
[im 128/139  lung]
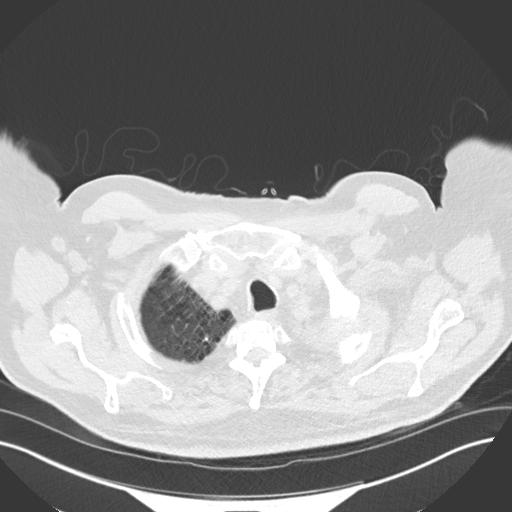

[Series 5: coronal · coronal · 0.57mm/px · 3 of 145 slices shown]
[im 29/145  lung]
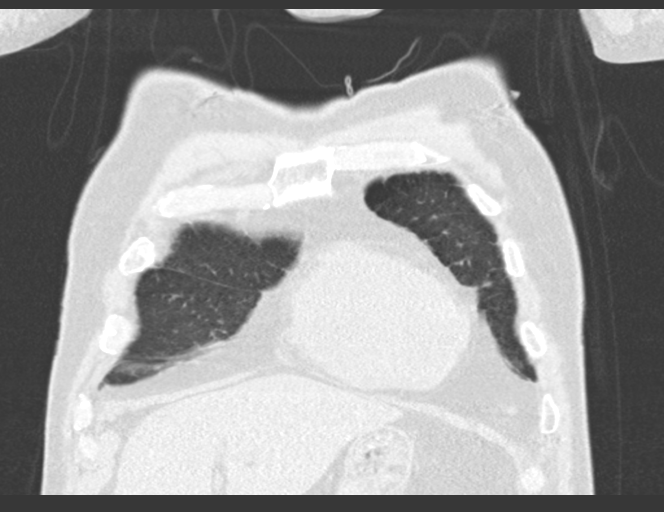
[im 58/145  lung]
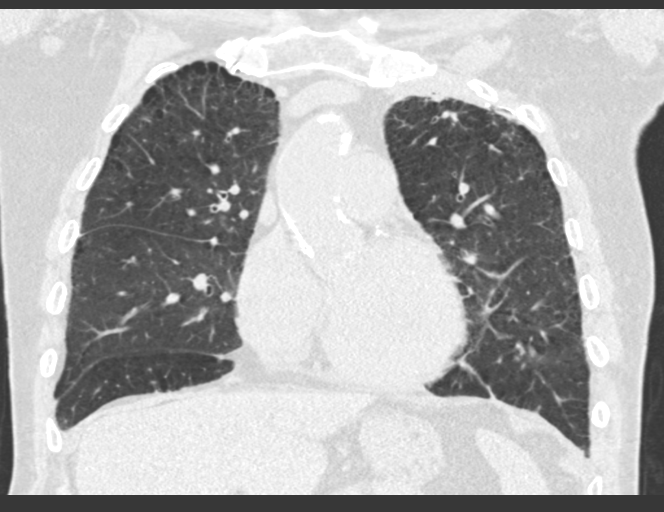
[im 87/145  lung]
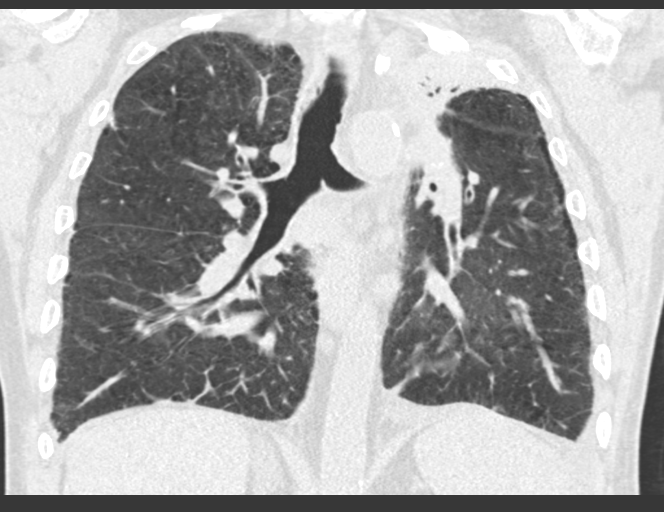

[15 of 36 positions shown; findings below may reference images not displayed]

FINDINGS: Cardiovascular: Normal heart size. Aortic atherosclerosis noted.
Three-vessel coronary artery calcification also demonstrated.

Mediastinum/Nodes: Sub-cm mediastinal lymph nodes in the right
paratracheal region show mild increase in size. Subcarinal
lymphadenopathy shows mild increased, currently measuring 1.6 cm on
image 62/2 compared to 10 mm previously. Left cardiophrenic angle
lymph node on image 115/2 measures 16 mm compared to 9 mm
previously. Hilar lymph nodes are difficult to evaluate on this
unenhanced exam.

Lungs/Pleura: New small pleural effusions are seen bilaterally.
Increased thickening of interlobular pulmonary septa seen in the
lung bases bilaterally, suspicious for mild interstitial edema.

Mild emphysema again demonstrated. Radiation changes with central
bronchiectasis in central left upper lobe appears stable. 1.4 cm
right upper lobe pulmonary nodule on image 37/3 remains unchanged
compared to multiple previous studies dating back to 0786,
consistent with benign etiology. No new or enlarging pulmonary
nodules or masses identified.

Upper Abdomen: Stable small bilateral low-attenuation adrenal
adenomas.

Musculoskeletal: No chest wall mass or suspicious bone lesions
identified.
IMPRESSION: New small bilateral pleural effusions. Increased thickening of
pulmonary interlobular septa in both lung bases, suspicious for mild
interstitial edema.

Mild increase in mediastinal lymphadenopathy since most recent exam.
This is nonspecific and could be due to lymphedema given suspected
mild interstitial edema, although enlarging lymph node metastases
cannot definitely be excluded. Recommend continued short-term
followup by chest CT in 3 months.

Stable left upper lobe radiation changes.  Stable mild emphysema.

Stable right upper lobe pulmonary nodule, consistent with benign
etiology. Stable small bilateral adrenal adenomas.

Aortic atherosclerosis and coronary artery calcification.

## 2018-01-20 IMAGING — DX DG CHEST 1V PORT
1 series · 1 of 1 positions shown · non-contrast
Comparison: October 26, 2016

CLINICAL DATA: Acute respiratory failure

EXAM:
PORTABLE CHEST 1 VIEW

[chest ap]
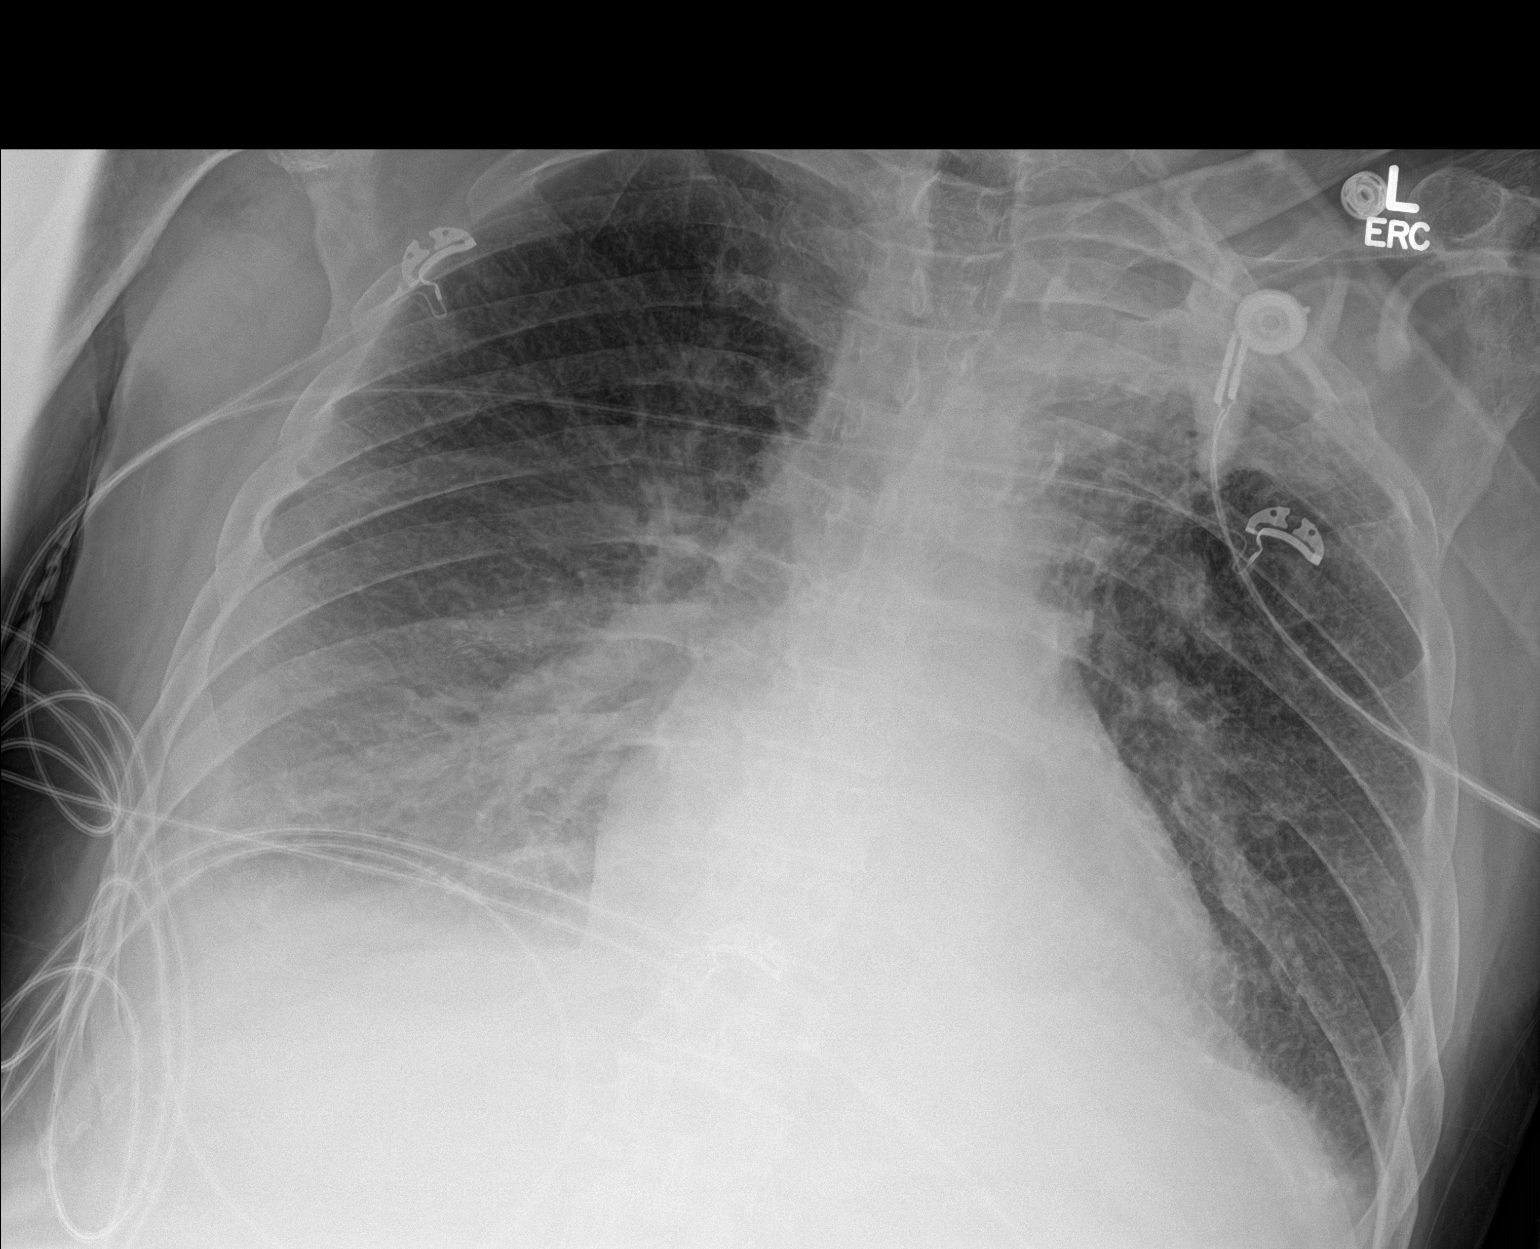

[1 of 1 positions shown; findings below may reference images not displayed]

FINDINGS: Pleuroparenchymal thickening in the left apex is stable. No
pneumothorax. The cardiomediastinal silhouette is stable. Probable
layering effusion on the right. Opacity in the right base mildly
worsened in the interval. No other interval change.
IMPRESSION: 1. Probable right layering effusion with underlying opacity, more
prominent in the interval.
2. Possible mild edema.  No other change.
